# Patient Record
Sex: Female | Born: 1945 | Race: White | Hispanic: No | Marital: Married | Smoking: Never smoker
Health system: Southern US, Community
[De-identification: ages and names within clinical notes are randomized; demographics above are authoritative.]

## PROBLEM LIST (undated history)

## (undated) DIAGNOSIS — S62109A Fracture of unspecified carpal bone, unspecified wrist, initial encounter for closed fracture: Secondary | ICD-10-CM

## (undated) DIAGNOSIS — E749 Disorder of carbohydrate metabolism, unspecified: Secondary | ICD-10-CM

## (undated) DIAGNOSIS — R51 Headache: Secondary | ICD-10-CM

## (undated) DIAGNOSIS — R634 Abnormal weight loss: Secondary | ICD-10-CM

## (undated) DIAGNOSIS — R519 Headache, unspecified: Secondary | ICD-10-CM

## (undated) DIAGNOSIS — G8929 Other chronic pain: Secondary | ICD-10-CM

## (undated) DIAGNOSIS — R053 Chronic cough: Secondary | ICD-10-CM

## (undated) DIAGNOSIS — R05 Cough: Secondary | ICD-10-CM

## (undated) DIAGNOSIS — T7840XA Allergy, unspecified, initial encounter: Secondary | ICD-10-CM

## (undated) DIAGNOSIS — J449 Chronic obstructive pulmonary disease, unspecified: Secondary | ICD-10-CM

## (undated) DIAGNOSIS — R630 Anorexia: Secondary | ICD-10-CM

## (undated) DIAGNOSIS — E860 Dehydration: Secondary | ICD-10-CM

## (undated) DIAGNOSIS — R413 Other amnesia: Secondary | ICD-10-CM

## (undated) DIAGNOSIS — F419 Anxiety disorder, unspecified: Secondary | ICD-10-CM

## (undated) DIAGNOSIS — E7800 Pure hypercholesterolemia, unspecified: Secondary | ICD-10-CM

## (undated) HISTORY — DX: Allergy, unspecified, initial encounter: T78.40XA

## (undated) HISTORY — DX: Other disorders of bilirubin metabolism: E80.6

## (undated) HISTORY — DX: Pure hypercholesterolemia, unspecified: E78.00

## (undated) HISTORY — DX: Headache: R51

## (undated) HISTORY — DX: Other amnesia: R41.3

## (undated) HISTORY — DX: Disorder of carbohydrate metabolism, unspecified: E74.9

## (undated) HISTORY — DX: Chronic obstructive pulmonary disease, unspecified: J44.9

## (undated) HISTORY — DX: Anxiety disorder, unspecified: F41.9

## (undated) HISTORY — DX: Abnormal weight loss: R63.4

## (undated) HISTORY — DX: Fracture of unspecified carpal bone, unspecified wrist, initial encounter for closed fracture: S62.109A

## (undated) HISTORY — PX: TUBAL LIGATION: SHX77

## (undated) HISTORY — PX: BREAST LUMPECTOMY: SHX2

## (undated) HISTORY — DX: Other chronic pain: G89.29

## (undated) HISTORY — DX: Headache, unspecified: R51.9

---

## 2002-06-13 ENCOUNTER — Encounter: Payer: Self-pay | Admitting: Family Medicine

## 2002-06-13 LAB — CONVERTED CEMR LAB: Pap Smear: NORMAL

## 2002-06-15 ENCOUNTER — Encounter: Payer: Self-pay | Admitting: Family Medicine

## 2002-06-15 LAB — CONVERTED CEMR LAB
RBC count: 3.72 10*6/uL
TSH: 2.35 microintl units/mL
WBC, blood: 5.8 10*3/uL

## 2003-07-13 ENCOUNTER — Encounter: Payer: Self-pay | Admitting: Family Medicine

## 2003-07-13 LAB — CONVERTED CEMR LAB
Blood Glucose, Fasting: 100 mg/dL
TSH: 3.11 microintl units/mL

## 2003-07-20 ENCOUNTER — Encounter: Payer: Self-pay | Admitting: Family Medicine

## 2003-07-20 LAB — CONVERTED CEMR LAB: Pap Smear: NORMAL

## 2004-01-29 DIAGNOSIS — S62109A Fracture of unspecified carpal bone, unspecified wrist, initial encounter for closed fracture: Secondary | ICD-10-CM

## 2004-01-29 HISTORY — DX: Fracture of unspecified carpal bone, unspecified wrist, initial encounter for closed fracture: S62.109A

## 2004-07-18 ENCOUNTER — Encounter: Payer: Self-pay | Admitting: Family Medicine

## 2004-07-18 LAB — CONVERTED CEMR LAB: Blood Glucose, Fasting: 97 mg/dL

## 2004-07-21 ENCOUNTER — Other Ambulatory Visit: Admission: RE | Admit: 2004-07-21 | Discharge: 2004-07-21 | Payer: Self-pay | Admitting: Family Medicine

## 2004-07-21 ENCOUNTER — Encounter: Payer: Self-pay | Admitting: Family Medicine

## 2005-11-03 ENCOUNTER — Ambulatory Visit: Payer: Self-pay | Admitting: Family Medicine

## 2005-11-05 ENCOUNTER — Encounter: Payer: Self-pay | Admitting: Family Medicine

## 2005-11-05 ENCOUNTER — Other Ambulatory Visit: Admission: RE | Admit: 2005-11-05 | Discharge: 2005-11-05 | Payer: Self-pay | Admitting: Family Medicine

## 2005-11-05 ENCOUNTER — Ambulatory Visit: Payer: Self-pay | Admitting: Family Medicine

## 2005-11-10 ENCOUNTER — Ambulatory Visit: Payer: Self-pay | Admitting: Family Medicine

## 2005-11-15 ENCOUNTER — Encounter: Payer: Self-pay | Admitting: Family Medicine

## 2005-11-18 ENCOUNTER — Ambulatory Visit: Payer: Self-pay

## 2005-12-07 ENCOUNTER — Ambulatory Visit: Payer: Self-pay | Admitting: Family Medicine

## 2005-12-07 LAB — CONVERTED CEMR LAB
RBC count: 3.69 10*6/uL
TSH: 3.01 microintl units/mL
WBC, blood: 5.3 10*3/uL

## 2005-12-09 ENCOUNTER — Ambulatory Visit: Payer: Self-pay | Admitting: Family Medicine

## 2006-01-04 ENCOUNTER — Other Ambulatory Visit: Payer: Self-pay

## 2006-01-11 ENCOUNTER — Ambulatory Visit: Payer: Self-pay | Admitting: Ophthalmology

## 2006-01-20 ENCOUNTER — Ambulatory Visit: Payer: Self-pay | Admitting: Family Medicine

## 2006-03-09 ENCOUNTER — Ambulatory Visit: Payer: Self-pay | Admitting: Family Medicine

## 2006-03-11 ENCOUNTER — Ambulatory Visit: Payer: Self-pay | Admitting: Family Medicine

## 2006-10-08 ENCOUNTER — Ambulatory Visit: Payer: Self-pay

## 2006-11-04 ENCOUNTER — Ambulatory Visit: Payer: Self-pay | Admitting: Family Medicine

## 2006-11-08 ENCOUNTER — Encounter: Payer: Self-pay | Admitting: Family Medicine

## 2006-11-08 ENCOUNTER — Ambulatory Visit: Payer: Self-pay | Admitting: Family Medicine

## 2006-11-08 ENCOUNTER — Other Ambulatory Visit: Admission: RE | Admit: 2006-11-08 | Discharge: 2006-11-08 | Payer: Self-pay | Admitting: *Deleted

## 2006-12-03 LAB — HM DEXA SCAN

## 2006-12-15 ENCOUNTER — Ambulatory Visit: Payer: Self-pay | Admitting: Family Medicine

## 2006-12-22 ENCOUNTER — Ambulatory Visit: Payer: Self-pay | Admitting: Family Medicine

## 2006-12-27 ENCOUNTER — Ambulatory Visit: Payer: Self-pay | Admitting: Family Medicine

## 2007-01-04 ENCOUNTER — Ambulatory Visit: Payer: Self-pay | Admitting: Family Medicine

## 2007-03-30 ENCOUNTER — Telehealth: Payer: Self-pay | Admitting: Family Medicine

## 2007-04-13 ENCOUNTER — Telehealth (INDEPENDENT_AMBULATORY_CARE_PROVIDER_SITE_OTHER): Payer: Self-pay | Admitting: *Deleted

## 2007-05-06 ENCOUNTER — Ambulatory Visit: Payer: Self-pay | Admitting: Family Medicine

## 2007-05-10 ENCOUNTER — Ambulatory Visit: Payer: Self-pay | Admitting: Family Medicine

## 2007-05-10 DIAGNOSIS — R634 Abnormal weight loss: Secondary | ICD-10-CM

## 2007-05-13 ENCOUNTER — Ambulatory Visit: Payer: Self-pay | Admitting: Family Medicine

## 2007-05-13 LAB — CONVERTED CEMR LAB
ALT: 21 units/L (ref 0–40)
AST: 23 units/L (ref 0–37)
Albumin: 3.8 g/dL (ref 3.5–5.2)
Alkaline Phosphatase: 28 units/L — ABNORMAL LOW (ref 39–117)
Basophils Absolute: 0 10*3/uL (ref 0.0–0.1)
Calcium: 9.9 mg/dL (ref 8.4–10.5)
Chloride: 104 meq/L (ref 96–112)
Eosinophils Absolute: 0.3 10*3/uL (ref 0.0–0.6)
Eosinophils Relative: 4.7 % (ref 0.0–5.0)
GFR calc non Af Amer: 44 mL/min
MCV: 98.2 fL (ref 78.0–100.0)
Platelets: 186 10*3/uL (ref 150–400)
RBC: 3.99 M/uL (ref 3.87–5.11)
WBC: 5.5 10*3/uL (ref 4.5–10.5)

## 2007-09-20 ENCOUNTER — Telehealth (INDEPENDENT_AMBULATORY_CARE_PROVIDER_SITE_OTHER): Payer: Self-pay | Admitting: *Deleted

## 2007-10-05 ENCOUNTER — Telehealth: Payer: Self-pay | Admitting: Family Medicine

## 2007-10-17 ENCOUNTER — Telehealth: Payer: Self-pay | Admitting: Family Medicine

## 2008-01-16 ENCOUNTER — Ambulatory Visit: Payer: Self-pay | Admitting: Family Medicine

## 2008-01-16 LAB — CONVERTED CEMR LAB
Albumin: 4.2 g/dL (ref 3.5–5.2)
Alkaline Phosphatase: 24 units/L — ABNORMAL LOW (ref 39–117)
BUN: 13 mg/dL (ref 6–23)
Creatinine,U: 164.9 mg/dL
GFR calc Af Amer: 72 mL/min
Microalb Creat Ratio: 57 mg/g — ABNORMAL HIGH (ref 0.0–30.0)
Microalb, Ur: 9.4 mg/dL — ABNORMAL HIGH (ref 0.0–1.9)
Potassium: 4.4 meq/L (ref 3.5–5.1)
Total CHOL/HDL Ratio: 2.2
Total Protein: 7.3 g/dL (ref 6.0–8.3)
Triglycerides: 96 mg/dL (ref 0–149)
VLDL: 19 mg/dL (ref 0–40)

## 2008-01-18 ENCOUNTER — Encounter: Payer: Self-pay | Admitting: Family Medicine

## 2008-01-18 DIAGNOSIS — E7800 Pure hypercholesterolemia, unspecified: Secondary | ICD-10-CM

## 2008-01-19 DIAGNOSIS — E749 Disorder of carbohydrate metabolism, unspecified: Secondary | ICD-10-CM | POA: Insufficient documentation

## 2008-01-19 DIAGNOSIS — M810 Age-related osteoporosis without current pathological fracture: Secondary | ICD-10-CM | POA: Insufficient documentation

## 2008-01-20 ENCOUNTER — Other Ambulatory Visit: Admission: RE | Admit: 2008-01-20 | Discharge: 2008-01-20 | Payer: Self-pay | Admitting: Family Medicine

## 2008-01-20 ENCOUNTER — Ambulatory Visit: Payer: Self-pay | Admitting: Family Medicine

## 2008-01-20 ENCOUNTER — Encounter: Payer: Self-pay | Admitting: Family Medicine

## 2008-01-20 DIAGNOSIS — R05 Cough: Secondary | ICD-10-CM

## 2008-01-20 LAB — CONVERTED CEMR LAB: Pap Smear: NORMAL

## 2008-01-24 ENCOUNTER — Encounter: Payer: Self-pay | Admitting: Family Medicine

## 2008-01-25 ENCOUNTER — Encounter (INDEPENDENT_AMBULATORY_CARE_PROVIDER_SITE_OTHER): Payer: Self-pay | Admitting: *Deleted

## 2008-01-26 ENCOUNTER — Ambulatory Visit: Payer: Self-pay | Admitting: Family Medicine

## 2008-01-26 ENCOUNTER — Encounter: Payer: Self-pay | Admitting: Family Medicine

## 2008-01-26 LAB — HM MAMMOGRAPHY

## 2008-02-02 ENCOUNTER — Encounter (INDEPENDENT_AMBULATORY_CARE_PROVIDER_SITE_OTHER): Payer: Self-pay | Admitting: *Deleted

## 2008-02-28 ENCOUNTER — Ambulatory Visit: Payer: Self-pay | Admitting: Family Medicine

## 2008-02-28 LAB — CONVERTED CEMR LAB: OCCULT 1: NEGATIVE

## 2008-02-28 LAB — FECAL OCCULT BLOOD, GUAIAC: Fecal Occult Blood: NEGATIVE

## 2008-02-29 ENCOUNTER — Encounter (INDEPENDENT_AMBULATORY_CARE_PROVIDER_SITE_OTHER): Payer: Self-pay | Admitting: *Deleted

## 2009-01-21 ENCOUNTER — Ambulatory Visit: Payer: Self-pay | Admitting: Family Medicine

## 2009-01-21 LAB — CONVERTED CEMR LAB
ALT: 18 units/L (ref 0–35)
Alkaline Phosphatase: 23 units/L — ABNORMAL LOW (ref 39–117)
Basophils Absolute: 0 10*3/uL (ref 0.0–0.1)
Bilirubin, Direct: 0.2 mg/dL (ref 0.0–0.3)
CO2: 33 meq/L — ABNORMAL HIGH (ref 19–32)
Cholesterol: 184 mg/dL (ref 0–200)
Glucose, Bld: 100 mg/dL — ABNORMAL HIGH (ref 70–99)
HDL: 95.5 mg/dL (ref >39.0)
Hemoglobin: 13.1 g/dL (ref 12.0–15.0)
LDL Cholesterol: 75 mg/dL (ref 0–99)
Lymphocytes Relative: 30 % (ref 12.0–46.0)
MCHC: 35.2 g/dL (ref 30.0–36.0)
Microalb Creat Ratio: 65 mg/g — ABNORMAL HIGH (ref 0.0–30.0)
Microalb, Ur: 12 mg/dL — ABNORMAL HIGH (ref 0.0–1.9)
Monocytes Relative: 10.1 % (ref 3.0–12.0)
Neutrophils Relative %: 53.3 % (ref 43.0–77.0)
Platelets: 145 10*3/uL — ABNORMAL LOW (ref 150–400)
Potassium: 4 meq/L (ref 3.5–5.1)
RDW: 12.7 % (ref 11.5–14.6)
Sodium: 145 meq/L (ref 135–145)
Total Bilirubin: 1.3 mg/dL — ABNORMAL HIGH (ref 0.3–1.2)
Total CHOL/HDL Ratio: 1.9
Total Protein: 6.7 g/dL (ref 6.0–8.3)
Triglycerides: 70 mg/dL (ref 0–149)
VLDL: 14 mg/dL (ref 0–40)

## 2009-01-23 ENCOUNTER — Ambulatory Visit: Payer: Self-pay | Admitting: Family Medicine

## 2009-01-23 ENCOUNTER — Other Ambulatory Visit: Admission: RE | Admit: 2009-01-23 | Discharge: 2009-01-23 | Payer: Self-pay | Admitting: Family Medicine

## 2009-01-23 ENCOUNTER — Encounter: Payer: Self-pay | Admitting: Family Medicine

## 2009-01-23 LAB — CONVERTED CEMR LAB
Pap Smear: NORMAL
Vit D, 25-Hydroxy: 46 ng/mL (ref 30–89)

## 2009-01-28 ENCOUNTER — Encounter (INDEPENDENT_AMBULATORY_CARE_PROVIDER_SITE_OTHER): Payer: Self-pay | Admitting: *Deleted

## 2010-01-28 ENCOUNTER — Ambulatory Visit: Payer: Self-pay | Admitting: Family Medicine

## 2010-01-28 LAB — CONVERTED CEMR LAB
AST: 22 units/L (ref 0–37)
Albumin: 4.2 g/dL (ref 3.5–5.2)
Alkaline Phosphatase: 27 units/L — ABNORMAL LOW (ref 39–117)
Bilirubin, Direct: 0.2 mg/dL (ref 0.0–0.3)
CO2: 32 meq/L (ref 19–32)
Calcium: 9.7 mg/dL (ref 8.4–10.5)
Creatinine,U: 187.2 mg/dL
GFR calc non Af Amer: 58.94 mL/min (ref >60)
Glucose, Bld: 89 mg/dL (ref 70–99)
HDL: 107.9 mg/dL (ref >39.00)
Microalb, Ur: 11.1 mg/dL — ABNORMAL HIGH (ref 0.0–1.9)
Potassium: 4 meq/L (ref 3.5–5.1)
Sodium: 144 meq/L (ref 135–145)
Total CHOL/HDL Ratio: 2
VLDL: 11.6 mg/dL (ref 0.0–40.0)

## 2010-01-30 ENCOUNTER — Ambulatory Visit: Payer: Self-pay | Admitting: Family Medicine

## 2010-07-02 ENCOUNTER — Encounter (INDEPENDENT_AMBULATORY_CARE_PROVIDER_SITE_OTHER): Payer: Self-pay | Admitting: *Deleted

## 2010-12-24 ENCOUNTER — Ambulatory Visit: Admit: 2010-12-24 | Payer: Self-pay | Admitting: Family Medicine

## 2010-12-31 NOTE — Assessment & Plan Note (Signed)
Summary: cpx/rbh   Vital Signs:  Patient profile:   65 year old female Height:      66.5 inches Weight:      99.25 pounds BMI:     15.84 Temp:     76 degrees F oral Pulse rate:   76 / minute Pulse rhythm:   regular BP sitting:   130 / 70  (left arm) Cuff size:   regular  Vitals Entered By: Sydell Axon LPN (January 30, 453 2:15 PM) CC: 30 Minute checkup, hemoccult cards given to patient   History of Present Illness: Pt here for Comp Exam, late from being at the bank.  She is a little nervous from being late.  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 Take 1 Tablet By Mouth Once A Day 2)  Fosamax 70 Mg Tabs (Alendronate Sodium) .Marland Kitchen.. 1 Tablet By Mouth Once A Week 3)  Megace Oral 40 Mg/ml Susp (Megestrol Acetate) .... One Cc By Mouth Two Times A Day  Allergies: No Known Drug Allergies  Past History:  Past Surgical History: Last updated: 01/18/2008 NSVD X 1 BTL LUMPECTOMY 29 YOA DEXA -- OSTEOPENIA FEM. NECK:(06/21/2002) FX. WRIST --FELL ON TENNIS COURT MCL PROBLEM CAUSED FALL:(01/2004) DEXA -- OSTEOPENIA-- (07/28/2004) CAROTID ULTRA SOUND :(11/18/2005) DEXA LEFT FEMORAL NECK --3.33--1.11 IMPROVED ALSO L/S:(12/03/2006)  Family History: Last updated: 01/30/2010 Father dec  25  ALZHEIMERS; STROKES  Mother  dec  70 Natural Causes Alzheimers Fall LOST WEIGHT ; LIVES IN [place] SISTER A 61 SISTER A 55 CV: NEGATIVE HBP: ? AUNTS DM: NEGATIVE GOUT/ARTHRITIS: PROSTATE CANCER:  MGM THROAT //MAUNT THROAT BREAST/OVARIAN/UTERINE CANCER: NEGATIVE COLON CANCER: DEPRESSION: + SELF OFF AND ON ETOH/DRUG ABUSE: NEGATIVE OTHER : STROKE + FATHER MULTIPLE X 5   Social History: Last updated: 01/18/2008 Marital Status: Married  LIVES WITH HUSBAND Children: DAUGHTER 31 Occupation: HOUSEWIFE  Risk Factors: Alcohol Use: 0 (01/30/2010) Caffeine Use: 0 (01/30/2010) Exercise: yes (01/30/2010)  Risk Factors: Smoking Status: never (01/30/2010) Passive Smoke Exposure: no (01/30/2010)  Family History: Father dec  61  ALZHEIMERS; STROKES  Mother dec  29 Natural Causes Alzheimers Fall LOST WEIGHT ; LIVES IN [place] SISTER A 61 SISTER A 55 CV: NEGATIVE HBP: ? AUNTS DM: NEGATIVE GOUT/ARTHRITIS: PROSTATE CANCER:  MGM THROAT //MAUNT THROAT BREAST/OVARIAN/UTERINE CANCER: NEGATIVE COLON CANCER: DEPRESSION: + SELF OFF AND ON ETOH/DRUG ABUSE: NEGATIVE OTHER : STROKE + FATHER MULTIPLE X 5   Social History: Caffeine use/day:  0  Review of Systems General:  Complains of fatigue; denies chills, fever, sweats, weakness, and weight loss. Eyes:  Denies blurring, discharge, and eye pain; recent eye exam, catarract removals bilat.. ENT:  Denies decreased hearing, earache, and ringing in ears. CV:  Denies chest pain or discomfort, fainting, fatigue, palpitations, shortness of breath with exertion, swelling  of feet, and swelling of hands; fast beats at times. Resp:  Complains of cough; denies shortness of breath and wheezing. GI:  Denies abdominal pain, bloody stools, change in bowel habits, constipation, dark tarry stools, diarrhea, gas, indigestion, loss of appetite, nausea, vomiting, vomiting blood, and yellowish skin color. GU:  Complains of nocturia; denies discharge, dysuria, and urinary frequency; drinks a  lot in evening. MS:  Denies joint pain, joint redness, joint swelling, low back pain, muscle aches, cramps, and stiffness. Derm:  Denies dryness, itching, and rash. Neuro:  Denies numbness, poor balance, tingling, and tremors.  Physical Exam  General:  Well-developed,well-nourished,in no acute distress; alert,appropriate and cooperative throughout examination. Thin  Head:  Normocephalic and atraumatic without obvious abnormalities. No apparent alopecia or balding. Eyes:  Conjunctiva clear bilaterally.  Ears:  External ear exam shows no significant lesions or deformities.  Otoscopic examination reveals clear canals, tympanic membranes are intact bilaterally without bulging, retraction, inflammation or discharge. Hearing is grossly normal bilaterally. Mild cerumen bilat. Nose:  External nasal examination shows no deformity or inflammation. Nasal mucosa are pink and moist without lesions or exudates. Mouth:  Oral mucosa and oropharynx without lesions or exudates.  Teeth in good repair, dentures. Neck:  No deformities, masses, or tenderness noted. Chest Wall:  No deformities, masses, or tenderness noted. Breasts:  No mass, nodules, thickening, tenderness, bulging, retraction, inflamation, nipple discharge or skin changes noted.   Lungs:  Normal respiratory effort, chest expands symmetrically. Lungs are clear to auscultation, no crackles or wheezes. Minimal fine wheezes ant over manubrial area. Heart:  Normal rate and regular rhythm. S1 and S2 normal without gallop, murmur, click, rub or other extra sounds. Abdomen:  Bowel sounds positive,abdomen soft and non-tender without masses, organomegaly or hernias noted. Rectal:  No external abnormalities noted. Normal sphincter tone. No rectal masses or tenderness. G neg. Genitalia:  Pelvic Exam:        External: normal female genitalia without lesions or masses        Vagina: normal without lesions or masses        Cervix: normal without lesions or  masses        Adnexa: normal bimanual exam without masses or fullness        Uterus: normal by palpation        Pap smear: not performed Msk:  No deformity or scoliosis noted of thoracic or lumbar spine.   Pulses:  R and L carotid,radial,femoral,dorsalis pedis and posterior tibial pulses are full and equal bilaterally Extremities:  No clubbing, cyanosis, edema, or deformity noted with normal full range of motion of all joints.   Neurologic:  No cranial nerve deficits noted. Station and gait are normal. Sensory, motor and coordinative functions appear intact. Skin:  Intact without suspicious lesions or rashes, mild dryness in places. Eczema of left upper back. Cervical Nodes:  No lymphadenopathy noted Axillary Nodes:  No palpable lymphadenopathy Inguinal Nodes:  No significant adenopathy Psych:  Cognition and judgment appear intact. Alert and cooperative with normal attention span and concentration. No apparent delusions, illusions, hallucinations   Impression & Recommendations:  Problem # 1:  HEALTH MAINTENANCE EXAM (ICD-V70.0) Assessment Comment Only Suggest Zostavax and Pneumonia. Will give Pneumonia todayy. Go to [facility] pharmacy for Zostavax.  Problem # 2:  COUGH (ICD-786.2) Assessment: Unchanged Stable for many yrs, no known etiology.  Problem # 3:  HYPERCHOLESTEROLEMIA (ICD-272.0) Excellent on Prava, cont. Her updated medication list for this problem includes:    Pravachol 40 Mg Tabs (Pravastatin sodium) .Marland KitchenMarland KitchenMarland KitchenMarland Kitchen  Take 1 tablet by mouth once a day  Labs Reviewed: SGOT: 22 (01/28/2010)   SGPT: 16 (01/28/2010)   HDL:107.90 (01/28/2010), 95.5 (01/21/2009)  LDL:68 (01/28/2010), 75 (01/21/2009)  Chol:187 (01/28/2010), 184 (01/21/2009)  Trig:58.0 (01/28/2010), 70 (01/21/2009)  Problem # 4:  GILBERT'S SYNDROME (ICD-277.4) Assessment: Unchanged Stable.  Problem # 5:  WEIGHT LOSS (ICD-783.21) Assessment: Unchanged Stable now. Is going to take care of her 4 grandchildren and will  probabbly debilitate her, take Ensure.  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 Microalb worse...avoid sodas and drink lots of water.  Complete Medication List: 1)  Pravachol 40 Mg Tabs (Pravastatin sodium) .... Take 1 tablet by mouth once a day 2)  Fosamax 70 Mg Tabs (Alendronate sodium) .Marland Kitchen.. 1 tablet by mouth once a week  Other Orders: Pneumococcal Vaccine (95621) Admin 1st Vaccine (30865)  Patient Instructions: 1)  Suggest Ensure as supplement. 2)  RTC one year, sooner as needed.  Current Allergies (reviewed today): No known allergies    Pneumovax Vaccine    Vaccine Type: Pneumovax (Medicare)    Site: left deltoid    Mfr: Merck    Dose: 0.5 ml    Route: IM    Given by: Sydell Axon LPN    Exp. Date: 07/14/2011    Lot #: [id number]    VIS given: 06/27/96 version given January 30, 2010.

## 2010-12-31 NOTE — Letter (Signed)
Summary: Nadara Eaton letter  [place] [facility]  [address] [place], [place] [address]   Phone: [phone]  Fax: [phone]       07/02/2010 MRN: [id number]  Haley Kim [address] [place], [place]  [address]  Dear [name] [place], and [facility] announce the retirement of Arta Silence, M.D., from full-time practice at the [facility] office effective May 29, 2010 and his plans of returning part-time.  It is important to Dr. Hetty Ely and to our practice that you understand that [facility] has seven physicians in our office for your health care needs.  We will continue to offer the same exceptional care that you have today.    Dr. Hetty Ely has spoken to many of you about his plans for retirement and returning part-time in the fall.   We will continue to work with you through the transition to schedule appointments for you in the office and meet the high standards that [facility] is committed to.   Again, it is with great pleasure that we share the news that Dr. Hetty Ely will return to [facility] in October of 2011 with a reduced schedule.    If you have any questions, or would like to request an appointment with one of our physicians, please call us at [phone] and press the option for Scheduling an appointment.  We take pleasure in providing you with excellent patient care and look forward to seeing you at your next office visit.  Our [facility] Physicians are:  Tillman Abide, M.D. Laurita Quint, M.D. Roxy Manns, M.D. Kerby Nora, M.D. Hannah Beat, M.D. Ruthe Mannan, M.D. We proudly welcomed Raechel Ache, M.D. and Eustaquio Boyden, M.D. to the practice in July/August 2011.  Sincerely,  [facility]

## 2011-02-07 ENCOUNTER — Encounter: Payer: Self-pay | Admitting: Family Medicine

## 2011-04-03 ENCOUNTER — Other Ambulatory Visit: Payer: Self-pay | Admitting: Family Medicine

## 2011-04-03 DIAGNOSIS — E749 Disorder of carbohydrate metabolism, unspecified: Secondary | ICD-10-CM

## 2011-04-03 DIAGNOSIS — E7800 Pure hypercholesterolemia, unspecified: Secondary | ICD-10-CM

## 2011-04-06 ENCOUNTER — Other Ambulatory Visit: Payer: Self-pay | Admitting: Family Medicine

## 2011-04-06 DIAGNOSIS — E7800 Pure hypercholesterolemia, unspecified: Secondary | ICD-10-CM

## 2011-04-06 DIAGNOSIS — R634 Abnormal weight loss: Secondary | ICD-10-CM

## 2011-04-06 DIAGNOSIS — E749 Disorder of carbohydrate metabolism, unspecified: Secondary | ICD-10-CM

## 2011-04-07 ENCOUNTER — Other Ambulatory Visit (INDEPENDENT_AMBULATORY_CARE_PROVIDER_SITE_OTHER): Payer: Medicare Other | Admitting: Family Medicine

## 2011-04-07 DIAGNOSIS — E7800 Pure hypercholesterolemia, unspecified: Secondary | ICD-10-CM

## 2011-04-07 DIAGNOSIS — R634 Abnormal weight loss: Secondary | ICD-10-CM

## 2011-04-07 DIAGNOSIS — E749 Disorder of carbohydrate metabolism, unspecified: Secondary | ICD-10-CM

## 2011-04-07 LAB — LIPID PANEL
HDL: 85.7 mg/dL (ref >39.00)
LDL Cholesterol: 77 mg/dL (ref 0–99)
Total CHOL/HDL Ratio: 2
Triglycerides: 76 mg/dL (ref 0.0–149.0)
VLDL: 15.2 mg/dL (ref 0.0–40.0)

## 2011-04-07 LAB — CBC WITH DIFFERENTIAL/PLATELET
Basophils Absolute: 0 10*3/uL (ref 0.0–0.1)
Eosinophils Absolute: 0.3 10*3/uL (ref 0.0–0.7)
HCT: 37.6 % (ref 36.0–46.0)
Lymphocytes Relative: 24.5 % (ref 12.0–46.0)
Lymphs Abs: 1.5 10*3/uL (ref 0.7–4.0)
MCHC: 34.2 g/dL (ref 30.0–36.0)
Monocytes Relative: 7.4 % (ref 3.0–12.0)
Platelets: 172 10*3/uL (ref 150.0–400.0)
RDW: 14.7 % — ABNORMAL HIGH (ref 11.5–14.6)

## 2011-04-07 LAB — BASIC METABOLIC PANEL
Chloride: 107 mEq/L (ref 96–112)
Creatinine, Ser: 1 mg/dL (ref 0.4–1.2)

## 2011-04-07 LAB — MICROALBUMIN / CREATININE URINE RATIO: Creatinine,U: 231.1 mg/dL

## 2011-04-07 LAB — HEPATIC FUNCTION PANEL
ALT: 15 U/L (ref 0–35)
Bilirubin, Direct: 0.1 mg/dL (ref 0.0–0.3)
Total Bilirubin: 1.4 mg/dL — ABNORMAL HIGH (ref 0.3–1.2)

## 2011-04-09 ENCOUNTER — Other Ambulatory Visit (HOSPITAL_COMMUNITY)
Admission: RE | Admit: 2011-04-09 | Discharge: 2011-04-09 | Disposition: A | Discharge status: Home / Self Care | Payer: Medicare Other | Source: Ambulatory Visit | Attending: Family Medicine | Admitting: Family Medicine

## 2011-04-09 ENCOUNTER — Ambulatory Visit (INDEPENDENT_AMBULATORY_CARE_PROVIDER_SITE_OTHER): Payer: Medicare Other | Admitting: Family Medicine

## 2011-04-09 ENCOUNTER — Encounter: Payer: Self-pay | Admitting: Family Medicine

## 2011-04-09 VITALS — BP 128/70 | HR 80 | Temp 97.6°F | Ht 66.25 in | Wt 93.2 lb

## 2011-04-09 DIAGNOSIS — I6523 Occlusion and stenosis of bilateral carotid arteries: Secondary | ICD-10-CM

## 2011-04-09 DIAGNOSIS — E7800 Pure hypercholesterolemia, unspecified: Secondary | ICD-10-CM

## 2011-04-09 DIAGNOSIS — Z0000 Encounter for general adult medical examination without abnormal findings: Secondary | ICD-10-CM

## 2011-04-09 DIAGNOSIS — R634 Abnormal weight loss: Secondary | ICD-10-CM

## 2011-04-09 DIAGNOSIS — M810 Age-related osteoporosis without current pathological fracture: Secondary | ICD-10-CM

## 2011-04-09 DIAGNOSIS — I6529 Occlusion and stenosis of unspecified carotid artery: Secondary | ICD-10-CM

## 2011-04-09 DIAGNOSIS — Z01419 Encounter for gynecological examination (general) (routine) without abnormal findings: Secondary | ICD-10-CM | POA: Insufficient documentation

## 2011-04-09 DIAGNOSIS — E749 Disorder of carbohydrate metabolism, unspecified: Secondary | ICD-10-CM

## 2011-04-09 MED ORDER — PRAVASTATIN SODIUM 40 MG PO TABS: 40.0000 mg | ORAL_TABLET | Freq: Every evening | ORAL | Status: DC

## 2011-04-09 MED ORDER — ALENDRONATE SODIUM 70 MG PO TABS: 70.0000 mg | ORAL_TABLET | ORAL | Status: DC

## 2011-04-09 MED ORDER — PRAVASTATIN SODIUM 40 MG PO TABS: 40.0000 mg | ORAL_TABLET | Freq: Every day | ORAL | Status: DC

## 2011-04-09 MED ORDER — MIRTAZAPINE 15 MG PO TABS: 15.0000 mg | ORAL_TABLET | Freq: Every day | ORAL | Status: DC

## 2011-04-09 NOTE — Progress Notes (Signed)
Subjective:    Patient ID: Haley Kim, female    DOB: 1946-06-07, 65 y.o.   MRN: [id number]  HPI Pt here for Comp Exam. She has lost another 6 pounds since last year. She has not been hungry lately and is not eating much.  Her father was very tall and thin. She thinks staying with her daughter last year for 4 mos may have been the impetus in that there were four children there (12, 9, 5 and 11/2)  and she was very busy. She could have eaten but knows that she didn't. She denies anxiety. She has done Ensure or Boost in the past and got to the point she did not like the taste. History of cancer in the family limited to a grandmother with cancer of the mouth and an aunt of the back. She feels reasonably healthy. She occas gets lightheaed. She still walks regularly and still occas plays tennis, as she used to .  She is due to go back to the daughter's in one week for another 4 mos!! She had weighed 90 upon returning from the daughter's last year.    Review of Systems  Constitutional: Positive for fatigue (mild lately). Negative for fever, chills, diaphoresis, activity change and appetite change. Unexpected weight change: has lost another 6 pounds in the last year. Has been as low as 90 after the visit to he r daughter's.  HENT: Negative for hearing loss, ear pain, nosebleeds, rhinorrhea, trouble swallowing, tinnitus and ear discharge.   Eyes: Negative for pain, discharge, redness and visual disturbance.  Respiratory: Positive for cough. Negative for chest tightness, shortness of breath and wheezing.   Cardiovascular: Positive for palpitations. Negative for chest pain and leg swelling.  Gastrointestinal: Negative for nausea, vomiting, abdominal pain, diarrhea, constipation, blood in stool and abdominal distention.  Genitourinary: Negative for dysuria and frequency.  Musculoskeletal: Positive for arthralgias (shoulders in AM.). Negative for myalgias and back pain.  Skin: Negative for rash.    Neurological: Negative for dizziness, tremors, syncope and numbness.  Hematological: Negative for adenopathy. Does not bruise/bleed easily.  Psychiatric/Behavioral: Negative for hallucinations and agitation. The patient is not nervous/anxious.        Objective:   Physical Exam  Constitutional: She is oriented to person, place, and time. She appears well-developed and well-nourished. No distress.       Anorexically thin.  HENT:  Head: Normocephalic and atraumatic.  Right Ear: External ear normal.  Left Ear: External ear normal.  Nose: Nose normal.  Mouth/Throat: Oropharynx is clear and moist.  Eyes: Conjunctivae and EOM are normal. Pupils are equal, round, and reactive to light. No scleral icterus.  Neck: Normal range of motion. Neck supple. No thyromegaly present.  Cardiovascular: Normal rate, regular rhythm, normal heart sounds and intact distal pulses.   No murmur heard.      Neck with carotid bruits mild bilat.  Pulmonary/Chest: Effort normal and breath sounds normal. No respiratory distress. She has no wheezes.  Abdominal: Soft. Bowel sounds are normal. She exhibits no mass. There is no tenderness.  Genitourinary: Vagina normal and uterus normal. Guaiac negative stool.       Introitus Nml Cervix Nml without tenderness Ovaries Not Felt   Musculoskeletal: Normal range of motion.       Back Nontender in the Midline  Lymphadenopathy:    She has no cervical adenopathy.  Neurological: She is alert and oriented to person, place, and time. She exhibits normal muscle tone. Coordination normal.  Skin: Skin is warm  and dry. No rash noted. She is not diaphoretic.  Psychiatric: She has a normal mood and affect. Her behavior is normal. Judgment and thought content normal.          Assessment & Plan:  HMPE   I have personally reviewed the Medicare Annual Wellness questionnaire and have noted 1. The patient's medical and social history 2. Their use of alcohol, tobacco or illicit  drugs 3. Their current medications and supplements 4. The patient's functional ability including ADL's, fall risks, home safety risks and hearing or visual             impairment. 5. Diet and physical activities 6. Evidence for depression or mood disorders

## 2011-04-09 NOTE — Assessment & Plan Note (Signed)
Great nos.

## 2011-04-09 NOTE — Patient Instructions (Addendum)
Try Remeron 15 mg at night. Call upon returning to area to get Carotid U/S. Disuss iFOB vs Colonoscopy next time.

## 2011-04-09 NOTE — Assessment & Plan Note (Signed)
Euglycemic today.

## 2011-04-09 NOTE — Assessment & Plan Note (Addendum)
New Carotid Bruits bilat, will eval upon return.

## 2011-04-09 NOTE — Assessment & Plan Note (Signed)
Stable

## 2011-04-09 NOTE — Assessment & Plan Note (Signed)
Very thin and at risk.

## 2011-04-09 NOTE — Assessment & Plan Note (Signed)
3 pounds heavier than when she returned from her daughter's but 6 pounds lighter than when here last year. Have tried Megace in the past without improvement. Has been on Ensure and Boost w/o improvement. Will try low dose Remeron. 15mg  at supper to bedtime.

## 2011-04-22 ENCOUNTER — Encounter: Payer: Self-pay | Admitting: *Deleted

## 2011-04-23 ENCOUNTER — Encounter: Payer: Self-pay | Admitting: *Deleted

## 2011-09-07 ENCOUNTER — Other Ambulatory Visit: Payer: Self-pay | Admitting: Family Medicine

## 2011-09-07 DIAGNOSIS — M810 Age-related osteoporosis without current pathological fracture: Secondary | ICD-10-CM

## 2011-09-07 DIAGNOSIS — R634 Abnormal weight loss: Secondary | ICD-10-CM

## 2011-09-07 DIAGNOSIS — E7800 Pure hypercholesterolemia, unspecified: Secondary | ICD-10-CM

## 2011-09-07 DIAGNOSIS — E749 Disorder of carbohydrate metabolism, unspecified: Secondary | ICD-10-CM

## 2011-09-10 ENCOUNTER — Other Ambulatory Visit (INDEPENDENT_AMBULATORY_CARE_PROVIDER_SITE_OTHER): Payer: Medicare Other

## 2011-09-10 DIAGNOSIS — R634 Abnormal weight loss: Secondary | ICD-10-CM

## 2011-09-10 DIAGNOSIS — E7800 Pure hypercholesterolemia, unspecified: Secondary | ICD-10-CM

## 2011-09-10 DIAGNOSIS — E749 Disorder of carbohydrate metabolism, unspecified: Secondary | ICD-10-CM

## 2011-09-10 DIAGNOSIS — M810 Age-related osteoporosis without current pathological fracture: Secondary | ICD-10-CM

## 2011-09-10 LAB — RENAL FUNCTION PANEL
Albumin: 4.4 g/dL (ref 3.5–5.2)
BUN: 13 mg/dL (ref 6–23)
Chloride: 104 mEq/L (ref 96–112)
Creatinine, Ser: 0.8 mg/dL (ref 0.4–1.2)
GFR: 80.5 mL/min (ref >60.00)
Glucose, Bld: 91 mg/dL (ref 70–99)
Phosphorus: 2.8 mg/dL (ref 2.3–4.6)

## 2011-09-10 LAB — CBC WITH DIFFERENTIAL/PLATELET
Basophils Absolute: 0 10*3/uL (ref 0.0–0.1)
Eosinophils Relative: 3.2 % (ref 0.0–5.0)
Hemoglobin: 13.2 g/dL (ref 12.0–15.0)
Lymphocytes Relative: 20.5 % (ref 12.0–46.0)
Monocytes Relative: 7.5 % (ref 3.0–12.0)
Neutro Abs: 4.8 10*3/uL (ref 1.4–7.7)
Platelets: 168 10*3/uL (ref 150.0–400.0)
RDW: 14.7 % — ABNORMAL HIGH (ref 11.5–14.6)
WBC: 7.1 10*3/uL (ref 4.5–10.5)

## 2011-09-10 LAB — HEPATIC FUNCTION PANEL
ALT: 20 U/L (ref 0–35)
AST: 23 U/L (ref 0–37)
Albumin: 4.4 g/dL (ref 3.5–5.2)
Total Protein: 7.7 g/dL (ref 6.0–8.3)

## 2011-09-10 LAB — MICROALBUMIN / CREATININE URINE RATIO
Creatinine,U: 199.5 mg/dL
Microalb Creat Ratio: 12.8 mg/g (ref 0.0–30.0)

## 2011-09-10 LAB — LIPID PANEL
Cholesterol: 180 mg/dL (ref 0–200)
HDL: 92 mg/dL (ref >39.00)
Total CHOL/HDL Ratio: 2
Triglycerides: 104 mg/dL (ref 0.0–149.0)

## 2011-09-10 LAB — TSH: TSH: 1.96 u[IU]/mL (ref 0.35–5.50)

## 2011-09-15 ENCOUNTER — Ambulatory Visit (INDEPENDENT_AMBULATORY_CARE_PROVIDER_SITE_OTHER): Payer: Medicare Other | Admitting: Family Medicine

## 2011-09-15 ENCOUNTER — Encounter: Payer: Self-pay | Admitting: Family Medicine

## 2011-09-15 DIAGNOSIS — E749 Disorder of carbohydrate metabolism, unspecified: Secondary | ICD-10-CM

## 2011-09-15 DIAGNOSIS — E7800 Pure hypercholesterolemia, unspecified: Secondary | ICD-10-CM

## 2011-09-15 DIAGNOSIS — Z0000 Encounter for general adult medical examination without abnormal findings: Secondary | ICD-10-CM

## 2011-09-15 DIAGNOSIS — R0989 Other specified symptoms and signs involving the circulatory and respiratory systems: Secondary | ICD-10-CM

## 2011-09-15 DIAGNOSIS — R634 Abnormal weight loss: Secondary | ICD-10-CM

## 2011-09-15 DIAGNOSIS — I6523 Occlusion and stenosis of bilateral carotid arteries: Secondary | ICD-10-CM

## 2011-09-15 DIAGNOSIS — M810 Age-related osteoporosis without current pathological fracture: Secondary | ICD-10-CM

## 2011-09-15 DIAGNOSIS — Z1231 Encounter for screening mammogram for malignant neoplasm of breast: Secondary | ICD-10-CM

## 2011-09-15 DIAGNOSIS — I6529 Occlusion and stenosis of unspecified carotid artery: Secondary | ICD-10-CM

## 2011-09-15 MED ORDER — MIRTAZAPINE 15 MG PO TABS: 15.0000 mg | ORAL_TABLET | Freq: Every day | ORAL | Status: DC

## 2011-09-15 NOTE — Progress Notes (Signed)
Addended by: Arta Silence on: 09/15/2011 03:32 PM   Modules accepted: Orders

## 2011-09-15 NOTE — Assessment & Plan Note (Signed)
Will recheck DEXA. Vit D on high side of therapeutic. Gets Calcium.

## 2011-09-15 NOTE — Progress Notes (Signed)
Addended by: Arta Silence on: 09/15/2011 03:48 PM   Modules accepted: Orders

## 2011-09-15 NOTE — Patient Instructions (Addendum)
Refer for Mammo and DEXA and Carotid U/S. For chronic congestion, take Guaifenesin (400mg ), take 11/2 tabs by mouth AM and NOON. Get GUAIFENESIN by  [facility] or [facility] and getting MUCOUS RELIEF EXPECTORANT/CONGESTION. DO NOT GET MUCINEX (Timed Release Guaifenesin)  After things improve, take one a day.  Add Claritin, Allegra or Zyrtec daily at night for a few months to see if sxs improve.

## 2011-09-15 NOTE — Assessment & Plan Note (Signed)
Reasonably new bruit heard on the left. Will get U/S to assess. With her current lipid profile, think there should be minimal plaque.

## 2011-09-15 NOTE — Assessment & Plan Note (Signed)
Spent long time discussing her situation of poor eating at her daughter's as she is so busy when there. She has decided she cannot go back a s she cannot handle it any more. The youngest is now 67 years old, a strong little boy who is physical, active and defiant. I am proud of her for declining to continue to put herself through that. She should now be able to gain more weight. Has gained two pounds since May, most of that time spent at the daughter's house so the gain is actually pretty good.

## 2011-09-15 NOTE — Assessment & Plan Note (Signed)
Euglycemic today. Cont healthy diet.

## 2011-09-15 NOTE — Assessment & Plan Note (Signed)
Great nos on Prava 40. Cont. Lab Results  Component Value Date   CHOL 180 09/10/2011   HDL 92.00 09/10/2011   LDLCALC 67 09/10/2011   TRIG 104.0 09/10/2011   CHOLHDL 2 09/10/2011

## 2011-09-15 NOTE — Progress Notes (Signed)
Subjective:    Patient ID: Haley Kim, female    DOB: Nov 28, 1946, 65 y.o.   MRN: [id number]  HPI Pt here for followup. She had a PE in May but her insurance company called Haley Kim and then our scheduler to schedule this appt today.  She has been gone 41/2 months taking care of her grandchildren Takes lots of Advil and was diasabused of that.     Review of Systems  Constitutional: Positive for fatigue (better at home when not chasing grandchildren. Now playing  tennis again without too much difficulty.). Negative for fever, chills, diaphoresis and unexpected weight change.  HENT: Positive for tinnitus (occas on left.). Negative for hearing loss, ear pain, rhinorrhea and trouble swallowing.   Eyes: Negative for pain, discharge and visual disturbance.  Respiratory: Positive for cough (has chronic cough daily. See instructions.). Negative for shortness of breath and wheezing.   Cardiovascular: Negative for chest pain, palpitations and leg swelling.       No Fainting or Fatigue.  Gastrointestinal: Positive for diarrhea (occas according to intake.). Negative for nausea, vomiting, abdominal pain, constipation and blood in stool.       No Heartburn  Genitourinary: Negative for dysuria and frequency.       Nocturia 2-3 times a night.  Musculoskeletal: Negative for myalgias, back pain and arthralgias.  Skin: Negative for rash.       No Itching or Dryness.  Neurological: Negative for tremors and numbness.       No Tingling. No Balance Problems.  Hematological: Negative for adenopathy. Does not bruise/bleed easily.  Psychiatric/Behavioral: Negative for dysphoric mood and agitation.       Objective:   Physical Exam  Constitutional: She is oriented to person, place, and time. She appears well-developed and well-nourished. No distress.       Thin but stable.   HENT:  Head: Normocephalic and atraumatic.  Right Ear: External ear normal.  Left Ear: External ear normal.  Nose: Nose  normal.  Mouth/Throat: Oropharynx is clear and moist.  Eyes: Conjunctivae and EOM are normal. Pupils are equal, round, and reactive to light. No scleral icterus.  Neck: Normal range of motion. Neck supple. No thyromegaly present.  Cardiovascular: Normal rate, regular rhythm, normal heart sounds and intact distal pulses.   No murmur heard. Pulmonary/Chest: Effort normal and breath sounds normal. No respiratory distress. She has no wheezes.  Abdominal: Soft. Bowel sounds are normal. She exhibits no mass. There is no tenderness.  Genitourinary: Vagina normal and uterus normal.       Bimanual only done due to Pap in May. Introitus Nml Cervix Nml without tenderness, Uterus nml size w/o nodularity. Ovaries Not Felt   Musculoskeletal: Normal range of motion.       Back Nontender in the Midline  Lymphadenopathy:    She has no cervical adenopathy.  Neurological: She is alert and oriented to person, place, and time. She exhibits normal muscle tone. Coordination normal.  Skin: Skin is warm and dry. No rash noted. She is not diaphoretic.  Psychiatric: She has a normal mood and affect. Her behavior is normal. Judgment and thought content normal.          Assessment & Plan:  HMPE  I have personally reviewed the Medicare Annual Wellness questionnaire and have noted 1. The patient's medical and social history 2. Their use of alcohol, tobacco or illicit drugs 3. Their current medications and supplements 4. The patient's functional ability including ADL's, fall risks, home safety risks and hearing  or visual             impairment. 5. Diet and physical activities 6. Evidence for depression or mood disorders No signs of cognitive decline, no sign of depression.

## 2011-09-15 NOTE — Assessment & Plan Note (Signed)
Normalized Bili today.

## 2011-09-22 ENCOUNTER — Encounter: Payer: Medicare Other | Admitting: *Deleted

## 2011-09-24 ENCOUNTER — Other Ambulatory Visit: Payer: Self-pay | Admitting: Family Medicine

## 2011-09-24 ENCOUNTER — Other Ambulatory Visit: Payer: Medicare Other

## 2011-09-24 ENCOUNTER — Encounter: Payer: Self-pay | Admitting: *Deleted

## 2011-09-24 DIAGNOSIS — Z0000 Encounter for general adult medical examination without abnormal findings: Secondary | ICD-10-CM

## 2011-09-24 LAB — FECAL OCCULT BLOOD, IMMUNOCHEMICAL: Fecal Occult Bld: NEGATIVE

## 2011-09-29 ENCOUNTER — Encounter (INDEPENDENT_AMBULATORY_CARE_PROVIDER_SITE_OTHER): Payer: Medicare Other | Admitting: *Deleted

## 2011-09-29 DIAGNOSIS — I6529 Occlusion and stenosis of unspecified carotid artery: Secondary | ICD-10-CM

## 2011-09-29 DIAGNOSIS — R0989 Other specified symptoms and signs involving the circulatory and respiratory systems: Secondary | ICD-10-CM

## 2011-10-01 ENCOUNTER — Encounter: Payer: Self-pay | Admitting: Family Medicine

## 2011-10-06 ENCOUNTER — Other Ambulatory Visit: Payer: Self-pay | Admitting: Family Medicine

## 2011-10-06 NOTE — Telephone Encounter (Signed)
Received refill request electronically from pharmacy. Is it okay to refill mediation?

## 2011-11-03 ENCOUNTER — Telehealth: Payer: Self-pay | Admitting: Internal Medicine

## 2011-11-03 NOTE — Telephone Encounter (Signed)
Patient called and stated since Dr. Hetty Ely is retiring at the end of the year she would like to transfer to you as a new patient if you approve.  Please advise.

## 2011-11-03 NOTE — Telephone Encounter (Signed)
Patient notified

## 2011-11-03 NOTE — Telephone Encounter (Signed)
Yes this is ok 

## 2011-11-11 ENCOUNTER — Ambulatory Visit: Payer: Medicare Other | Admitting: Family Medicine

## 2011-11-12 ENCOUNTER — Encounter: Payer: Self-pay | Admitting: Family Medicine

## 2011-11-16 ENCOUNTER — Encounter: Payer: Self-pay | Admitting: Family Medicine

## 2011-11-19 ENCOUNTER — Ambulatory Visit (INDEPENDENT_AMBULATORY_CARE_PROVIDER_SITE_OTHER): Payer: Medicare Other | Admitting: Family Medicine

## 2011-11-19 ENCOUNTER — Ambulatory Visit: Payer: Medicare Other | Admitting: Internal Medicine

## 2011-11-19 ENCOUNTER — Encounter: Payer: Self-pay | Admitting: Family Medicine

## 2011-11-19 DIAGNOSIS — M810 Age-related osteoporosis without current pathological fracture: Secondary | ICD-10-CM

## 2011-11-19 DIAGNOSIS — M949 Disorder of cartilage, unspecified: Secondary | ICD-10-CM

## 2011-11-19 DIAGNOSIS — I6529 Occlusion and stenosis of unspecified carotid artery: Secondary | ICD-10-CM

## 2011-11-19 DIAGNOSIS — I6523 Occlusion and stenosis of bilateral carotid arteries: Secondary | ICD-10-CM

## 2011-11-19 DIAGNOSIS — R634 Abnormal weight loss: Secondary | ICD-10-CM

## 2011-11-19 DIAGNOSIS — M8580 Other specified disorders of bone density and structure, unspecified site: Secondary | ICD-10-CM

## 2011-11-19 NOTE — Assessment & Plan Note (Signed)
Wt Readings from Last 3 Encounters:  11/19/11 93 lb 12 oz (42.525 kg)  09/15/11 92 lb 12 oz (42.071 kg)  04/09/11 93 lb 4 oz (42.298 kg)   Stable.

## 2011-11-19 NOTE — Progress Notes (Signed)
  Subjective:    Patient ID: Haley Kim, female    DOB: 05-23-1946, 65 y.o.   MRN: [id number]  HPI Pt here with husband to discuss results of recent DEXA scan. She has risk factors of being white, thin, postmenopausal. She is very active and plays tennis regularly. She has been on Fosamax for quite a few years. She has been osteopenic on two previous DEXAs in the femoral head area. She has no complaints today and feels well. Husband also concerned about getting her carotids checked as she had 30% blockage in years past. Her chol is marvelous with LDL <70 and her carotids are quiet on exam. Discussed risk being very low.   Review of SystemsNoncontributory except as above.       Objective:   Physical Exam  Constitutional: She appears well-developed and well-nourished. No distress.  HENT:  Head: Normocephalic and atraumatic.  Right Ear: External ear normal.  Left Ear: External ear normal.  Nose: Nose normal.  Mouth/Throat: Oropharynx is clear and moist. No oropharyngeal exudate.  Eyes: Conjunctivae and EOM are normal. Pupils are equal, round, and reactive to light.  Neck: Normal range of motion. Neck supple. No thyromegaly present.       Carotids quiet bilaterally.  Cardiovascular: Normal rate, regular rhythm and normal heart sounds.   Pulmonary/Chest: Effort normal and breath sounds normal. She has no wheezes. She has no rales.  Lymphadenopathy:    She has no cervical adenopathy.  Skin: She is not diaphoretic.          Assessment & Plan:  Pt here at my request to discuss DEXA.

## 2011-11-19 NOTE — Assessment & Plan Note (Signed)
See osteoporosis above.

## 2011-11-19 NOTE — Patient Instructions (Signed)
RTC with Dr Para March in the future as needed.

## 2011-11-19 NOTE — Assessment & Plan Note (Signed)
Pt has been oseopenic for some time. Cont Vit D replacement and Calcium. Stop Fosamax. Recheck DEXA in future.

## 2011-11-19 NOTE — Assessment & Plan Note (Signed)
MInimal stenosis of 30% in past. LDL <70, carotids quiet. Risk low. Reassured.

## 2011-12-31 ENCOUNTER — Ambulatory Visit (INDEPENDENT_AMBULATORY_CARE_PROVIDER_SITE_OTHER): Payer: Self-pay | Admitting: Family Medicine

## 2011-12-31 ENCOUNTER — Encounter: Payer: Self-pay | Admitting: Family Medicine

## 2011-12-31 VITALS — BP 140/80 | HR 88 | Temp 97.8°F | Wt 93.0 lb

## 2011-12-31 DIAGNOSIS — R300 Dysuria: Secondary | ICD-10-CM

## 2011-12-31 LAB — POCT URINALYSIS DIPSTICK
Bilirubin, UA: NEGATIVE
Ketones, UA: NEGATIVE
Leukocytes, UA: NEGATIVE

## 2011-12-31 MED ORDER — SULFAMETHOXAZOLE-TRIMETHOPRIM 800-160 MG PO TABS: 1.0000 | ORAL_TABLET | Freq: Two times a day (BID) | ORAL | Status: DC

## 2011-12-31 NOTE — Patient Instructions (Signed)
Nice to meet you. Please take Bactrim as directed- 1 tablet twice daily for 5 days. Drink plenty of fluids. We will call you on Monday with your urine culture results.

## 2011-12-31 NOTE — Progress Notes (Signed)
SUBJECTIVE: Haley Kim is a 66 y.o. female new to me who complains of foul smelling urine, urgency and increased frequency x 21 days, without dysuria,  fever, chills, or abnormal vaginal discharge or bleeding.  She did have some left sided back pain a few days ago.  Patient Active Problem List  Diagnoses  . DISORDER, CARBOHYDRATE METABOLISM NOS  . HYPERCHOLESTEROLEMIA  . GILBERT'S SYNDROME  . WEIGHT LOSS  . Carotid stenosis, bilateral  . Osteopenia   Past Medical History  Diagnosis Date  . NSVD (normal spontaneous vaginal delivery)     x 1  . Wrist fracture 01/2004    fell on tennis court, muscle problem caused  fall   Past Surgical History  Procedure Date  . Breast lumpectomy     bilateral, age 80   History  Substance Use Topics  . Smoking status: Never Smoker   . Smokeless tobacco: Never Used  . Alcohol Use: No   Family History  Problem Relation Age of Onset  . Alzheimer's disease Mother   . Alzheimer's disease Father   . Stroke Father     x 5  . Cancer Paternal Aunt     throat  . Cancer Paternal Grandmother     throat  . Diabetes Neg Hx   . Heart disease Neg Hx   . Alcohol abuse Neg Hx    No Known Allergies Current Outpatient Prescriptions on File Prior to Visit  Medication Sig Dispense Refill  . Calcium Citrate-Vitamin D (CALCIUM CITRATE + PO) Take by mouth 3 (three) times daily.        . Cholecalciferol (VITAMIN D3) 3000 UNITS TABS Take by mouth daily.        . Coenzyme Q10 50 MG CAPS Take by mouth daily.        Marland Kitchen MAGNESIUM PO Take 2 by mouth 2 times a day       . MILK THISTLE PO Take by mouth 2 (two) times daily.        . Multiple Vitamin (MULTIVITAMIN) tablet Take 1 tablet by mouth daily.        . NON FORMULARY Coconut Oil; take one by mouth daily       . Omega-3 Fatty Acids (OMEGA 3 PO) Take by mouth daily.        . pravastatin (PRAVACHOL) 40 MG tablet TAKE ONE TABLET BY MOUTH EVERY DAY  90 tablet  3  . Probiotic Product (PROBIOTIC PO) Take by  mouth daily.        . psyllium (REGULOID) 0.52 G capsule Two by mouth three times a day       . vitamin E 400 UNIT capsule Take 400 Units by mouth daily.         The PMH, PSH, Social History, Family History, Medications, and allergies have been reviewed in [facility], and have been updated if relevant.  OBJECTIVE: BP 140/80  Pulse 88  Temp(Src) 97.8 F (36.6 C) (Oral)  Wt 93 lb (42.185 kg)  Appears well but thin, in no apparent distress.  Vital signs are normal. The abdomen is soft without tenderness, guarding, mass, rebound or organomegaly. No CVA tenderness or inguinal adenopathy noted. Urine dipstick shows positive for RBC's and positive for protein.    ASSESSMENT/Plan: possible UTI- will treat with bactrim and send urine for culture.  If no bacteria in cx, consider urology work up.

## 2012-01-03 LAB — URINE CULTURE

## 2012-01-04 ENCOUNTER — Other Ambulatory Visit: Payer: Self-pay | Admitting: Family Medicine

## 2012-01-04 MED ORDER — SULFAMETHOXAZOLE-TRIMETHOPRIM 800-160 MG PO TABS: 1.0000 | ORAL_TABLET | Freq: Two times a day (BID) | ORAL | Status: AC

## 2012-01-08 ENCOUNTER — Telehealth: Payer: Self-pay | Admitting: Family Medicine

## 2012-01-08 MED ORDER — MEGESTROL ACETATE 20 MG PO TABS: 20.0000 mg | ORAL_TABLET | Freq: Every day | ORAL | Status: AC

## 2012-01-08 NOTE — Telephone Encounter (Signed)
Rx sent 

## 2012-01-08 NOTE — Telephone Encounter (Signed)
Patient called to give Dr. Dayton Martes an update on her symptoms.  She stated that she doesn't really have an appetite but she does drink boost or ensure 2-3 times daily, she is still nauseated, not feeling as weak and she does stay up more during the day than she use to.  She still complains of congestion and phlegm but seems to be doing better.  Patient wanted to know if there is anything else she can take to help her appetite improve besides Mirtazapine?  Please advise.

## 2012-01-08 NOTE — Telephone Encounter (Signed)
Megace is an appetite suppressant.

## 2012-01-08 NOTE — Telephone Encounter (Signed)
Error, i meant to write that Megace an appetite stimulant, not suppressant!

## 2012-01-08 NOTE — Telephone Encounter (Signed)
It does have many risks and side effects so please make sure she reads those before starting this medication.

## 2012-01-08 NOTE — Telephone Encounter (Signed)
Patient advised as instructed via telephone, she stated that she would like the Rx for Megace sent to [facility].

## 2012-01-08 NOTE — Telephone Encounter (Signed)
Patient advised as instructed via telephone. 

## 2012-06-13 ENCOUNTER — Ambulatory Visit (INDEPENDENT_AMBULATORY_CARE_PROVIDER_SITE_OTHER)
Admission: RE | Admit: 2012-06-13 | Discharge: 2012-06-13 | Disposition: A | Discharge status: Home / Self Care | Payer: Medicare Other | Source: Ambulatory Visit | Attending: Family Medicine | Admitting: Family Medicine

## 2012-06-13 ENCOUNTER — Ambulatory Visit (INDEPENDENT_AMBULATORY_CARE_PROVIDER_SITE_OTHER): Payer: Medicare Other | Admitting: Family Medicine

## 2012-06-13 ENCOUNTER — Encounter: Payer: Self-pay | Admitting: Family Medicine

## 2012-06-13 VITALS — BP 122/70 | HR 80 | Temp 98.0°F | Wt 90.0 lb

## 2012-06-13 DIAGNOSIS — R300 Dysuria: Secondary | ICD-10-CM | POA: Insufficient documentation

## 2012-06-13 DIAGNOSIS — R05 Cough: Secondary | ICD-10-CM

## 2012-06-13 DIAGNOSIS — Z1211 Encounter for screening for malignant neoplasm of colon: Secondary | ICD-10-CM

## 2012-06-13 DIAGNOSIS — R634 Abnormal weight loss: Secondary | ICD-10-CM

## 2012-06-13 DIAGNOSIS — R059 Cough, unspecified: Secondary | ICD-10-CM

## 2012-06-13 LAB — COMPREHENSIVE METABOLIC PANEL
ALT: 17 U/L (ref 0–35)
AST: 25 U/L (ref 0–37)
Albumin: 4.4 g/dL (ref 3.5–5.2)
Alkaline Phosphatase: 34 U/L — ABNORMAL LOW (ref 39–117)
BUN: 21 mg/dL (ref 6–23)
CO2: 33 mEq/L — ABNORMAL HIGH (ref 19–32)
Calcium: 10.3 mg/dL (ref 8.4–10.5)
Chloride: 101 mEq/L (ref 96–112)
Creatinine, Ser: 1.2 mg/dL (ref 0.4–1.2)
GFR: 48.34 mL/min — ABNORMAL LOW (ref >60.00)
Glucose, Bld: 122 mg/dL — ABNORMAL HIGH (ref 70–99)
Potassium: 4.8 mEq/L (ref 3.5–5.1)
Sodium: 142 mEq/L (ref 135–145)
Total Bilirubin: 0.7 mg/dL (ref 0.3–1.2)
Total Protein: 7.5 g/dL (ref 6.0–8.3)

## 2012-06-13 LAB — CBC WITH DIFFERENTIAL/PLATELET
Basophils Absolute: 0 10*3/uL (ref 0.0–0.1)
Basophils Relative: 0.5 % (ref 0.0–3.0)
Eosinophils Absolute: 0.2 10*3/uL (ref 0.0–0.7)
Eosinophils Relative: 2.8 % (ref 0.0–5.0)
HCT: 40.7 % (ref 36.0–46.0)
Hemoglobin: 13.5 g/dL (ref 12.0–15.0)
Lymphocytes Relative: 19.8 % (ref 12.0–46.0)
Lymphs Abs: 1.2 10*3/uL (ref 0.7–4.0)
MCHC: 33.3 g/dL (ref 30.0–36.0)
MCV: 100.8 fl — ABNORMAL HIGH (ref 78.0–100.0)
Monocytes Absolute: 0.6 10*3/uL (ref 0.1–1.0)
Monocytes Relative: 9.4 % (ref 3.0–12.0)
Neutro Abs: 4 10*3/uL (ref 1.4–7.7)
Neutrophils Relative %: 67.5 % (ref 43.0–77.0)
Platelets: 182 10*3/uL (ref 150.0–400.0)
RBC: 4.04 Mil/uL (ref 3.87–5.11)
RDW: 13.7 % (ref 11.5–14.6)
WBC: 5.9 10*3/uL (ref 4.5–10.5)

## 2012-06-13 LAB — POCT URINALYSIS DIPSTICK
Bilirubin, UA: NEGATIVE
Glucose, UA: NEGATIVE
Ketones, UA: NEGATIVE
Leukocytes, UA: NEGATIVE
Nitrite, UA: NEGATIVE
Spec Grav, UA: >=1.03
Urobilinogen, UA: NEGATIVE
pH, UA: 6

## 2012-06-13 LAB — TSH: TSH: 2 u[IU]/mL (ref 0.35–5.50)

## 2012-06-13 MED ORDER — OMEPRAZOLE 20 MG PO CPDR: 20.0000 mg | DELAYED_RELEASE_CAPSULE | Freq: Every day | ORAL | Status: DC

## 2012-06-13 MED ORDER — CIPROFLOXACIN HCL 250 MG PO TABS: 250.0000 mg | ORAL_TABLET | Freq: Two times a day (BID) | ORAL | Status: AC

## 2012-06-13 MED ORDER — MIRTAZAPINE 15 MG PO TABS: 15.0000 mg | ORAL_TABLET | Freq: Every day | ORAL | Status: DC

## 2012-06-13 NOTE — Addendum Note (Signed)
Addended by: Eliezer Bottom on: 06/13/2012 04:40 PM   Modules accepted: Orders

## 2012-06-13 NOTE — Addendum Note (Signed)
Addended by: Alvina Chou on: 06/13/2012 05:13 PM   Modules accepted: Orders

## 2012-06-13 NOTE — Progress Notes (Signed)
Subjective:    Patient ID: Haley Kim, female    DOB: Dec 10, 1945, 66 y.o.   MRN: [id number]  HPI  66 yo female here with her husband with multiple complaints (previous Dr. Hetty Ely pt whom I have seen for one acute visit only).  Weight loss- notes reviewed, appears this has been a long standing issue.  According to Dr. Lorenza Chick notes and pt's husband, it was felt this was due to stressors of living with her children. She is no longer living with them but weight loss has persisted.  She feels nothing tastes good, does get full easily.  Has chronic cough.  No night sweats.  She has never had a colonoscopy- denies any blood in stool but often has constipation. Upon questioning, states at times she has difficulty swallowing solids but not usually.  Was previously on remeron and megace- felt neither one worked. Wt Readings from Last 3 Encounters:  06/13/12 90 lb (40.824 kg)  12/31/11 93 lb (42.185 kg)  11/19/11 93 lb 12 oz (42.525 kg)   Denies an eating disorder- husband states that all she eats is sugar and drinks diet pepsi. Denies any symptoms of anxiety or depression.  Dysuria-several days of dysuria.  No fever or back pain.  Cough- mainly when she lies down but can occur all the time. Denies feeling of acid in throat.  Patient Active Problem List  Diagnosis  . DISORDER, CARBOHYDRATE METABOLISM NOS  . HYPERCHOLESTEROLEMIA  . GILBERT'S SYNDROME  . WEIGHT LOSS  . Carotid stenosis, bilateral  . Osteopenia  . Dysuria  . Cough   Past Medical History  Diagnosis Date  . NSVD (normal spontaneous vaginal delivery)     x 1  . Wrist fracture 01/2004    fell on tennis court, muscle problem caused  fall   Past Surgical History  Procedure Date  . Breast lumpectomy     bilateral, age 22   History  Substance Use Topics  . Smoking status: Never Smoker   . Smokeless tobacco: Never Used  . Alcohol Use: No   Family History  Problem Relation Age of Onset  . Alzheimer's  disease Mother   . Alzheimer's disease Father   . Stroke Father     x 5  . Cancer Paternal Aunt     throat  . Cancer Paternal Grandmother     throat  . Diabetes Neg Hx   . Heart disease Neg Hx   . Alcohol abuse Neg Hx    No Known Allergies Current Outpatient Prescriptions on File Prior to Visit  Medication Sig Dispense Refill  . Ascorbic Acid (VITAMIN C) 1000 MG tablet Take 1,000 mg by mouth daily.      . Calcium Citrate-Vitamin D (CALCIUM CITRATE + PO) Take by mouth 3 (three) times daily.        . Cholecalciferol (VITAMIN D3) 3000 UNITS TABS Take by mouth daily.        . Coenzyme Q10 50 MG CAPS Take by mouth daily.        Marland Kitchen MAGNESIUM PO Take 2 by mouth 2 times a day       . Multiple Vitamin (MULTIVITAMIN) tablet Take 1 tablet by mouth daily.        . NON FORMULARY Coconut Oil; take one by mouth daily       . Omega-3 Fatty Acids (OMEGA 3 PO) Take by mouth daily.        . pravastatin (PRAVACHOL) 40 MG tablet TAKE ONE TABLET BY MOUTH  EVERY DAY  90 tablet  3  . Probiotic Product (PROBIOTIC PO) Take by mouth daily.        . psyllium (REGULOID) 0.52 G capsule Two by mouth three times a day       . vitamin E 400 UNIT capsule Take 400 Units by mouth daily.        . mirtazapine (REMERON) 15 MG tablet Take 1 tablet (15 mg total) by mouth at bedtime.  30 tablet  1  . omeprazole (PRILOSEC) 20 MG capsule Take 1 capsule (20 mg total) by mouth daily.  30 capsule  3   The PMH, PSH, Social History, Family History, Medications, and allergies have been reviewed in [facility], and have been updated if relevant.     Review of Systems See HPI   No CP No SOB Objective:   Physical Exam BP 122/70  Pulse 80  Temp 98 F (36.7 C)  Wt 90 lb (40.824 kg)  Constitutional: She is oriented to person, place, and time. She appears well-developed and well-nourished. No distress.  Anorexically thin.  HENT:  Head: Normocephalic and atraumatic.  Right Ear: External ear normal.  Left Ear: External ear normal.    Nose: Nose normal.  Mouth/Throat: Oropharynx is clear and moist.  Eyes: Conjunctivae and EOM are normal. Pupils are equal, round, and reactive to light. No scleral icterus.  Neck: Normal range of motion. Neck supple. No thyromegaly present.  Cardiovascular: Normal rate, regular rhythm, normal heart sounds and intact distal pulses.  No murmur heard. Neck with carotid bruits mild bilat.  Pulmonary/Chest: Effort normal and breath sounds normal. No respiratory distress. She has no wheezes.  Abdominal: Soft. Bowel sounds are normal. She exhibits no mass. There is no tenderness.  Musculoskeletal: Normal range of motion.  Back Nontender in the Midline  Lymphadenopathy:  She has no cervical adenopathy.  Neurological: She is alert and oriented to person, place, and time. She exhibits normal muscle tone. Coordination normal.  Skin: Skin is warm and dry. No rash noted. She is not diaphoretic.  Psychiatric: She has a normal mood and affect. Her behavior is normal. Judgment and thought content normal.      Assessment & Plan:   1. WEIGHT LOSS  Stable but persistent. Likely multifactorial, including poor diet, but other factors need to be ruled out.  Will start with complete blood work, GI referral for screening colonoscopy/possible endoscopy. Restart remeron. The patient indicates understanding of these issues and agrees with the plan.  CBC with Differential, Comprehensive metabolic panel, TSH, Vitamin D, 25-hydroxy, Ambulatory referral to Gastroenterology  2. Dysuria  New- UA pos for blood only, did not leave large enough sample for micro or cx. Treat for UTI with 3 day course of cipro, will send her home with specimen cup for repeat sample for micro and cx. POCT urinalysis dipstick  3. Cough  ?possible GERD. Given samples of prilosec 20 mg daily to take for 2 weeks. CXR given weight loss. DG Chest 2 View  4. Screening for colon cancer  Ambulatory referral to Gastroenterology

## 2012-06-13 NOTE — Patient Instructions (Addendum)
Good to see you. Please come back and leave a urine sample for [place] at your convenience. I will call you with your xray and lab results from today. Please stop by to see Shirlee Limerick on your way out to set up your referral with a stomach doctor. We are starting prilosec 20 mg daily. Please come see me in one month. Consider a nutrition referral.

## 2012-06-14 LAB — URINALYSIS, MICROSCOPIC ONLY: Squamous Epithelial / LPF: NONE SEEN

## 2012-06-14 LAB — VITAMIN D 25 HYDROXY (VIT D DEFICIENCY, FRACTURES): Vit D, 25-Hydroxy: 76 ng/mL (ref 30–89)

## 2012-06-15 ENCOUNTER — Other Ambulatory Visit: Payer: Self-pay | Admitting: Family Medicine

## 2012-06-15 DIAGNOSIS — R7989 Other specified abnormal findings of blood chemistry: Secondary | ICD-10-CM

## 2012-06-15 LAB — URINE CULTURE: Colony Count: 5000

## 2012-06-20 ENCOUNTER — Other Ambulatory Visit (INDEPENDENT_AMBULATORY_CARE_PROVIDER_SITE_OTHER): Payer: Medicare Other

## 2012-06-20 DIAGNOSIS — R7989 Other specified abnormal findings of blood chemistry: Secondary | ICD-10-CM

## 2012-06-20 LAB — COMPREHENSIVE METABOLIC PANEL
Albumin: 4.3 g/dL (ref 3.5–5.2)
BUN: 14 mg/dL (ref 6–23)
CO2: 31 mEq/L (ref 19–32)
GFR: 68.72 mL/min (ref >60.00)
Glucose, Bld: 108 mg/dL — ABNORMAL HIGH (ref 70–99)
Potassium: 4.1 mEq/L (ref 3.5–5.1)
Sodium: 143 mEq/L (ref 135–145)
Total Bilirubin: 1.2 mg/dL (ref 0.3–1.2)
Total Protein: 7.1 g/dL (ref 6.0–8.3)

## 2012-07-11 ENCOUNTER — Ambulatory Visit (INDEPENDENT_AMBULATORY_CARE_PROVIDER_SITE_OTHER): Payer: Medicare Other | Admitting: Gastroenterology

## 2012-07-11 ENCOUNTER — Encounter: Payer: Self-pay | Admitting: Gastroenterology

## 2012-07-11 ENCOUNTER — Other Ambulatory Visit: Payer: Medicare Other

## 2012-07-11 VITALS — BP 118/60 | HR 80 | Ht 66.0 in | Wt 93.0 lb

## 2012-07-11 DIAGNOSIS — R6881 Early satiety: Secondary | ICD-10-CM

## 2012-07-11 DIAGNOSIS — R634 Abnormal weight loss: Secondary | ICD-10-CM

## 2012-07-11 MED ORDER — MOVIPREP 100 G PO SOLR: 1.0000 | ORAL | Status: DC

## 2012-07-11 NOTE — Patient Instructions (Addendum)
You will have labs checked today in the basement lab.  Please head down after you check out with the front desk  (celiac sprue panel). You will be set up for a colonoscopy (for screening, weight loss, bowel troubles). You will be set up for an upper endoscopy for weight loss, early satiety.  LEC, moderate.  Please start taking citrucel (orange flavored) powder fiber supplement.  This may cause some bloating at first but that usually goes away. Begin with a small spoonful and work your way up to a large, heaping spoonful daily over a week. Try eating well rounded diet, higher in calorie.

## 2012-07-11 NOTE — Progress Notes (Signed)
HPI: This is a   very pleasant 66 year old woman who is here with her husband today.   CBC, complete metabolic profile, thyroid testing last month were all essentially normal except for slight macrocytosis  She has lost 20 pounds in about 2 years.  She has poor appetite.  Food not appetizing.  She has no post prandial pains.  BMs cause about pain for 30 prior and 30 min after. Dark brown to black stools.  Formed, occasionally diarrhea.  Can be constipated at times.  Takes advil 2-3 times a week only.    Non drinker.    Her husband brought up the fact that they both have some memory difficulties, his wife seems more prominent lately, perhaps worsening. She is not aware of memory difficulties. Both of her parents had Alzheimer's  Review of systems: Pertinent positive and negative review of systems were noted in the above HPI section. Complete review of systems was performed and was otherwise normal.    Past Medical History  Diagnosis Date  . NSVD (normal spontaneous vaginal delivery)     x 1  . Wrist fracture 01/2004    fell on tennis court, muscle problem caused  fall  . Pure hypercholesterolemia   . Disorders of bilirubin excretion   . Loss of weight   . 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 Breast lumpectomy     bilateral, age 45  . Tubal ligation     Current Outpatient Prescriptions  Medication Sig Dispense Refill  . Cholecalciferol (VITAMIN D3) 2000 UNITS TABS Take by mouth as directed.      . Coenzyme Q10 50 MG CAPS Take by mouth daily.        Marland Kitchen MAGNESIUM PO Take 500 mg by mouth as directed.       . mirtazapine (REMERON) 15 MG tablet Take 1 tablet (15 mg total) by mouth at bedtime.  30 tablet  1  . Multiple Vitamin (MULTIVITAMIN) tablet Take 1 tablet by mouth daily.        . Omega-3 Fatty Acids (OMEGA 3 PO) Take by mouth daily.        . pravastatin (PRAVACHOL) 40 MG tablet TAKE ONE TABLET BY MOUTH  EVERY DAY  90 tablet  3  . Probiotic Product (PROBIOTIC PO) Take by mouth daily.        . vitamin E 400 UNIT capsule Take 400 Units by mouth daily.        . psyllium (REGULOID) 0.52 G capsule Two by mouth three times a day         Allergies as of 07/11/2012  . (No Known Allergies)    Family History  Problem Relation Age of Onset  . Alzheimer's disease Mother   . Alzheimer's disease Father   . Stroke Father     x 5  . Cancer Paternal Aunt     throat  . Cancer Paternal Grandmother     throat  . Diabetes Neg Hx   . Heart disease Neg Hx   . Alcohol abuse Neg Hx   . Colon cancer Neg Hx     History   Social History  . Marital Status: Married    Spouse Name: N/A    Number of Children: 1  . Years of Education: N/A   Occupational History  . Housewife    Social History Main Topics  . Smoking status: Never Smoker   . Smokeless tobacco:  Never Used  . Alcohol Use: No  . Drug Use: No  . Sexually Active: No   Other Topics Concern  . Not on file   Social History Narrative  . No narrative on file       Physical Exam: BP 118/60  Pulse 80  Ht 5\' 6"  (1.676 m)  Wt 93 lb (42.185 kg)  BMI 15.01 kg/m2 Constitutional: Thin Psychiatric: alert and oriented x3 Eyes: extraocular movements intact Mouth: oral pharynx moist, no lesions Neck: supple no lymphadenopathy Cardiovascular: heart regular rate and rhythm Lungs: clear to auscultation bilaterally Abdomen: soft, nontender, nondistended, no obvious ascites, no peritoneal signs, normal bowel sounds Extremities: no lower extremity edema bilaterally Skin: no lesions on visible extremities    Assessment and plan: 66 y.o. female with  weight loss, anorexia, alternating bowel patterns, early satiety  I think we should definitely proceed with upper and lower endoscopies to rule out occult neoplasm. Try to explain her early satiety, alternating bowel patterns. I'm going to test her for celiac sprue with blood tests. This can  explain some weight loss possibly. I brought up the fact that her memory is worsening and both of her parents had Alzheimer's and perhaps she may also have that condition developing. I do understand that Alzheimer's can be associated with anorexia and weight loss. We agreed to complete his GI workup noted above and if unhelpful then she and her husband would be much more interested in hearing more about the possibility of Alzheimer's.

## 2012-07-12 LAB — CELIAC PANEL 10: IgA: 133 mg/dL (ref 69–380)

## 2012-07-25 ENCOUNTER — Ambulatory Visit (AMBULATORY_SURGERY_CENTER): Payer: Medicare Other | Admitting: Gastroenterology

## 2012-07-25 ENCOUNTER — Encounter: Payer: Self-pay | Admitting: Gastroenterology

## 2012-07-25 VITALS — BP 137/78 | HR 82 | Temp 99.1°F | Resp 16 | Ht 66.0 in | Wt 93.0 lb

## 2012-07-25 DIAGNOSIS — R6881 Early satiety: Secondary | ICD-10-CM

## 2012-07-25 DIAGNOSIS — K2990 Gastroduodenitis, unspecified, without bleeding: Secondary | ICD-10-CM

## 2012-07-25 DIAGNOSIS — K5730 Diverticulosis of large intestine without perforation or abscess without bleeding: Secondary | ICD-10-CM

## 2012-07-25 DIAGNOSIS — K2970 Gastritis, unspecified, without bleeding: Secondary | ICD-10-CM

## 2012-07-25 DIAGNOSIS — R634 Abnormal weight loss: Secondary | ICD-10-CM

## 2012-07-25 DIAGNOSIS — K449 Diaphragmatic hernia without obstruction or gangrene: Secondary | ICD-10-CM

## 2012-07-25 MED ORDER — SODIUM CHLORIDE 0.9 % IV SOLN: 500.0000 mL | INTRAVENOUS | Status: DC

## 2012-07-25 NOTE — Op Note (Signed)
[facility] [address] [place], [address]   ENDOSCOPY PROCEDURE REPORT  PATIENT: Haley, Kim  MR#: [id number] BIRTHDATE: 1946-08-11 , 66  yrs. old GENDER: Female ENDOSCOPIST: Rachael Fee, MD PROCEDURE DATE:  07/25/2012 PROCEDURE:  EGD w/ biopsy ASA CLASS:     Class III INDICATIONS:  weight loss, early satiety, equivocal celiac sprue markers. MEDICATIONS: There was residual sedation effect present from prior procedure, Versed 1 mg IV, and These medications were titrated to patient response per physician's verbal order TOPICAL ANESTHETIC: Cetacaine Spray  DESCRIPTION OF PROCEDURE: After the risks benefits and alternatives of the procedure were thoroughly explained, informed consent was obtained.  The LB [id number]-[id number] endoscope was introduced through the mouth and advanced to the second portion of the duodenum. Without limitations.  The instrument was slowly withdrawn as the mucosa was fully examined.    STOMACH: Mild gastritis (inflammation) was found in the gastric antrum.  A biopsy was performed using cold forceps.  Sample sent for histology.   A small hiatal hernia  was found.  The examination was otherwise normal.  Normal dudoenum biopsied and sent to pathology.  Retroflexed views revealed no abnormalities.     The scope was then withdrawn from the patient and the procedure completed.  COMPLICATIONS: There were no complications.  ENDOSCOPIC IMPRESSION: 1.   Gastritis (inflammation) was found in the gastric antrum; biopsied to check for H. pyori 2.   Hiatus hernia was found 3.   Normal duodenum biopsied to check for Celiac Sprue  RECOMMENDATIONS: Await biopsy results.  If neither H.  pylori or Celiac Sprue changes are noted, I will will likey refer her back to PCP to consider other etiologies (mother and father both had Altzheimers at young age and she herself has had some memory issues lately).    eSigned:  Rachael Fee, MD  07/25/2012 3:11 PM   AO:[id number], Ena Dawley MD

## 2012-07-25 NOTE — Patient Instructions (Addendum)

## 2012-07-25 NOTE — Progress Notes (Signed)
Patient did not experience any of the following events: a burn prior to discharge; a fall within the facility; wrong site/side/patient/procedure/implant event; or a hospital transfer or hospital admission upon discharge from the facility. (G8907) Patient did not have preoperative order for IV antibiotic SSI prophylaxis. (G8918)  

## 2012-07-25 NOTE — Op Note (Signed)
[facility] [address] [place], [address]   COLONOSCOPY PROCEDURE REPORT  PATIENT: Haley, Kim  MR#: [id number] BIRTHDATE: 08-Mar-1946 , 66  yrs. old GENDER: Female ENDOSCOPIST: Rachael Fee, MD REFERRED [id number] Aron, M.D. PROCEDURE DATE:  07/25/2012 PROCEDURE:   Colonoscopy, diagnostic ASA CLASS:   Class II INDICATIONS:weight loss. MEDICATIONS: Fentanyl 50 mcg IV, Versed 3 mg IV, and These medications were titrated to patient response per physician's verbal order  DESCRIPTION OF PROCEDURE:   After the risks benefits and alternatives of the procedure were thoroughly explained, informed consent was obtained.  A digital rectal exam revealed no abnormalities of the rectum.   The LB [id number]  endoscope was introduced through the anus and advanced to the cecum, which was identified by both the appendix and ileocecal valve. No adverse events experienced.   The quality of the prep was good.  The instrument was then slowly withdrawn as the colon was fully examined.    COLON FINDINGS: There was mild diverticulosis noted in the left colon with associated tortuosity.  The examination was otherwise normal.  Retroflexed views revealed no abnormalities. The time to cecum=9 minutes 26 seconds  Withdrawal time=4 minutes 37 seconds. The scope was withdrawn and the procedure completed. COMPLICATIONS: There were no complications.  ENDOSCOPIC IMPRESSION: There was mild diverticulosis noted in the left colon No polyps or cancers  RECOMMENDATIONS: 1.  You should continue to follow colorectal cancer screening guidelines for "routine risk" patients with a repeat colonoscopy in 10 years.  There is no need for FOBT (stool) testing for at least 5 years. 2.  Will proceed with EGD now.    eSigned:  Rachael Fee, MD 07/25/2012 2:58 PM

## 2012-07-26 ENCOUNTER — Telehealth: Payer: Self-pay | Admitting: *Deleted

## 2012-07-26 NOTE — Telephone Encounter (Signed)
  Follow up Call-  Call back number 07/25/2012  Post procedure Call Back phone  # [phone]  Permission to leave phone message Yes     Patient questions:  Do you have a fever, pain , or abdominal swelling? yes Pain Score  6 *  Have you tolerated food without any problems? yes  Have you been able to return to your normal activities? yes  Do you have any questions about your discharge instructions: Diet   no Medications  no Follow up visit  no  Do you have questions or concerns about your Care? no  Actions: * If pain score is 4 or above: Physician/ provider Notified : Rob Bunting, MD. Patient stating she awoke with a 6/10 pain in shoulders and back of her head. States she does have a history of headaches and questioned whether she should take Advil. Reviewed Endoscopy report with the patient. Diagnosed with Gastritis, informed the patient that Tylenol would be a better choice. Informed the patient to call PCP if pain or symptoms worsen. Patient denies any GI pains or problems. Note forwarded to Dr. Christella Hartigan for any further advice.

## 2012-07-26 NOTE — Telephone Encounter (Signed)
Pt headache and shoulder pain has gotten much better she will call with any further concerns

## 2012-07-26 NOTE — Telephone Encounter (Signed)
Great - thanks

## 2012-07-26 NOTE — Telephone Encounter (Signed)
I agree.  Patty, Can you call her later this afternoon to see how her headach, shoulder pain is doing

## 2012-07-29 ENCOUNTER — Encounter: Payer: Self-pay | Admitting: Gastroenterology

## 2012-08-16 ENCOUNTER — Encounter: Payer: Self-pay | Admitting: Family Medicine

## 2012-08-16 ENCOUNTER — Ambulatory Visit (INDEPENDENT_AMBULATORY_CARE_PROVIDER_SITE_OTHER): Payer: Medicare Other | Admitting: Family Medicine

## 2012-08-16 VITALS — BP 124/82 | HR 84 | Temp 97.7°F | Wt 91.0 lb

## 2012-08-16 DIAGNOSIS — R05 Cough: Secondary | ICD-10-CM

## 2012-08-16 DIAGNOSIS — R630 Anorexia: Secondary | ICD-10-CM

## 2012-08-16 DIAGNOSIS — R413 Other amnesia: Secondary | ICD-10-CM

## 2012-08-16 DIAGNOSIS — J449 Chronic obstructive pulmonary disease, unspecified: Secondary | ICD-10-CM

## 2012-08-16 DIAGNOSIS — E7800 Pure hypercholesterolemia, unspecified: Secondary | ICD-10-CM

## 2012-08-16 MED ORDER — FLUTICASONE-SALMETEROL 250-50 MCG/DOSE IN AEPB: 1.0000 | INHALATION_SPRAY | Freq: Two times a day (BID) | RESPIRATORY_TRACT | Status: DC

## 2012-08-16 MED ORDER — ALBUTEROL SULFATE HFA 108 (90 BASE) MCG/ACT IN AERS: 2.0000 | INHALATION_SPRAY | Freq: Four times a day (QID) | RESPIRATORY_TRACT | Status: DC | PRN

## 2012-08-16 NOTE — Patient Instructions (Addendum)
Good to see you. Please stop taking your pravachol.  We will recheck your cholesterol when you come in for your yearly visit in October/November.  We are starting Advair which you will take daily along with proair as needed for shortness of breath.  Please stop by to see Shirlee Limerick on your way out to set up your appointment with a lung doctor and a nutritionist.

## 2012-08-16 NOTE — Progress Notes (Signed)
Subjective:    Patient ID: Haley Kim, female    DOB: 16-Jun-1946, 66 y.o.   MRN: [id number]  HPI  66 yo female here with her husband with persistent weight loss.  I initially saw her in 05/2012 for this complaint which has been a long standing issue.     According to Dr. Lorenza Chick notes and pt's husband, originally it was felt this was due to stressors of living with her children. She is no longer living with them but weight loss has persisted.  She feels nothing tastes good, does get full easily.  Has chronic cough.  No night sweats.   Was previously on remeron and megace- felt neither one worked.  Wt Readings from Last 3 Encounters:  08/16/12 91 lb (41.277 kg)  07/25/12 93 lb (42.185 kg)  07/11/12 93 lb (42.185 kg)     I referred her to GI last month to rule out neoplasm, esophageal issue- EGD showed some gastritis, colonoscopy neg.  No malignancies noted.  Celiac and H. Pylori neg.  We also restarted Remeron in July which has not helped.  She did not refill it.  Lab Results  Component Value Date   WBC 5.9 06/13/2012   HGB 13.5 06/13/2012   HCT 40.7 06/13/2012   MCV 100.8* 06/13/2012   PLT 182.0 06/13/2012    Denies an eating disorder- husband states that all she eats is sugar and drinks diet pepsi. Denies any symptoms of anxiety or depression. States nothing tastes good.  Has had persistent memory loss.  Both of her parents suffered from Alzheimer's dementia. Both she and her husband feel that her memory loss is mild- she sometimes forgets to do something her husband asks her to do, otherwise not having memory loss.  Cough- mainly when she lies down but can occur all the time. Denies feeling of acid in throat. CXR was neg for acute findings in July- did show changes of COPD due to hyperinflation. She has never smoked. Cough can be very "deep" and productive.  ?GERD- gave her samples of prilosec last time- she has not continued this and does not think it  helped.  HLD- has been on pravastatin for years.  Lab Results  Component Value Date   CHOL 180 09/10/2011   HDL 92.00 09/10/2011   LDLCALC 67 09/10/2011   TRIG 104.0 09/10/2011   CHOLHDL 2 09/10/2011     Patient Active Problem List  Diagnosis  . DISORDER, CARBOHYDRATE METABOLISM NOS  . HYPERCHOLESTEROLEMIA  . GILBERT'S SYNDROME  . WEIGHT LOSS  . Carotid stenosis, bilateral  . Osteopenia  . Dysuria  . Cough  . Memory loss  . Loss of appetite   Past Medical History  Diagnosis Date  . NSVD (normal spontaneous vaginal delivery)     x 1  . Wrist fracture 01/2004    fell on tennis court, muscle problem caused  fall  . Pure hypercholesterolemia   . Disorders of bilirubin excretion   . Loss of weight   . 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 Allergy   . COPD (chronic obstructive pulmonary disease)    Past Surgical History  Procedure Date  . Breast lumpectomy     bilateral, age 37  . Tubal ligation    History  Substance Use Topics  . Smoking status: Never Smoker   . Smokeless tobacco: Never Used  . Alcohol Use: No   Family History  Problem Relation Age of Onset  . Alzheimer's disease Mother   .  Alzheimer's disease Father   . Stroke Father     x 5  . Heart disease Father   . Cancer Paternal Aunt     throat  . Cancer Paternal Grandmother     throat  . Diabetes Neg Hx   . Alcohol abuse Neg Hx   . Colon cancer Neg Hx    No Known Allergies Current Outpatient Prescriptions on File Prior to Visit  Medication Sig Dispense Refill  . Cholecalciferol (VITAMIN D3) 2000 UNITS TABS Take by mouth as directed.      . ciprofloxacin (CIPRO) 250 MG tablet       . Coenzyme Q10 50 MG CAPS Take by mouth daily.        Marland Kitchen MAGNESIUM PO Take 500 mg by mouth as directed.       . mirtazapine (REMERON) 15 MG tablet Take 1 tablet (15 mg total) by mouth at bedtime.  30 tablet  1  . Multiple Vitamin (MULTIVITAMIN) tablet Take 1 tablet by mouth daily.         . Omega-3 Fatty Acids (OMEGA 3 PO) Take by mouth daily.        . pravastatin (PRAVACHOL) 40 MG tablet TAKE ONE TABLET BY MOUTH EVERY DAY  90 tablet  3  . Probiotic Product (PROBIOTIC PO) Take by mouth daily.        . psyllium (REGULOID) 0.52 G capsule Two by mouth three times a day       . vitamin E 400 UNIT capsule Take 400 Units by mouth daily.         The PMH, PSH, Social History, Family History, Medications, and allergies have been reviewed in [facility], and have been updated if relevant.     Review of Systems See HPI   No CP No SOB No fevers No night sweats No bone pain   Objective:   Physical Exam BP 124/82  Pulse 84  Temp 97.7 F (36.5 C)  Wt 91 lb (41.277 kg) Wt Readings from Last 3 Encounters:  08/16/12 91 lb (41.277 kg)  07/25/12 93 lb (42.185 kg)  07/11/12 93 lb (42.185 kg)    Constitutional: She is oriented to person, place, and time. She appears well-developed and well-nourished. No distress.  Anorexically thin.  HENT:  Head: Normocephalic and atraumatic.  Right Ear: External ear normal.  Left Ear: External ear normal.  Nose: Nose normal.  Mouth/Throat: Oropharynx is clear and moist.  Eyes: Conjunctivae and EOM are normal. Pupils are equal, round, and reactive to light. No scleral icterus.  Neck: Normal range of motion. Neck supple. No thyromegaly present.  Cardiovascular: Normal rate, regular rhythm, normal heart sounds and intact distal pulses.  No murmur heard. Neck with carotid bruits mild bilat.  Pulmonary/Chest: Effort normal and breath sounds normal. No respiratory distress. She has no wheezes.  Abdominal: Soft. Bowel sounds are normal. She exhibits no mass. There is no tenderness.  Musculoskeletal: Normal range of motion.  Back Nontender in the Midline  Lymphadenopathy:  She has no cervical adenopathy.  Neurological: She is alert and oriented to person, place, and time. She exhibits normal muscle tone. Coordination normal. Oriented x 3, recall and  word finding normal  Skin: Skin is warm and dry. No rash noted. She is not diaphoretic.  Psychiatric: She has a normal mood and affect. Her behavior is normal. Judgment and thought content normal.      Assessment & Plan:   1. WEIGHT LOSS  Stable but persistent. Likely multifactorial, including  poor diet.  GI malignancy ruled out. CXR in July neg for masses as well. Refer to nutritionist.    2. Cough  COPD with ?GERD. Spirometry today- consistent with moderately severe obstruction. Will start on Advair along with proair for rescue inhaler. Refer to pulm.    3. Memory loss Very mild and not currently consistent with dementia.  Will continue to monitor.    4.  HLD-  D/c pravachol.  Lipids very low and she does not have CAD or DM. Recheck lipids next month when she comes in for physical.

## 2012-09-07 ENCOUNTER — Ambulatory Visit (INDEPENDENT_AMBULATORY_CARE_PROVIDER_SITE_OTHER): Payer: Medicare Other | Admitting: Internal Medicine

## 2012-09-07 ENCOUNTER — Encounter: Payer: Self-pay | Admitting: Internal Medicine

## 2012-09-07 VITALS — BP 130/80 | HR 82 | Temp 98.5°F | Ht 67.0 in | Wt 90.0 lb

## 2012-09-07 DIAGNOSIS — R05 Cough: Secondary | ICD-10-CM

## 2012-09-07 DIAGNOSIS — J329 Chronic sinusitis, unspecified: Secondary | ICD-10-CM

## 2012-09-07 NOTE — Progress Notes (Signed)
Subjective:    Patient ID: Haley Kim, female    DOB: [birth year], 66 y.o.   MRN: [id number]  HPI PMD is Ruthe Mannan, MD  Body mass index is 14.10 kg/(m^2).   reports that she has never smoked. She has never used smokeless tobacco.   IOV 09/07/2012  Referred for copd. BUt main complaint/symptoms is  cough when she saw PMD. Then testing with CXr and spirometry showed "COPD" and therefore referred here. In addition she has 12 other symptoms on printed sheet including dyspnea, atypical chest pain, pain in back, poor energy, waking up nauseated, frequent headaches, poor appetite, poor taste (everything tastes like mush), anosmia, staying in bed 12-15h/day (fatigue) and post prandial pain (reportedly has IBS). She has lost weight without intention. Current Body mass index is 14.10 kg/(m^2).   AT this visit, we focussed on cough  Cough. Insidious onset several years ago. Rates cough as severe. Progressive esp last 2 years. Quality is described as a shallow hacking cough.  Coughs constantly. Usually episodes are spontaneously. Worse when she lies down but also worsened by drinking something cold. Somewhat improved by warm air but no other relieving factors. There is associated clearing of throat a lot, and a feeling of sand in chest and the need to bring it out. However, mucus volume is very small and is clear and think. There is also associated wheezing and an occasional feeling of tickle in throat. Hx is alsp positive for mucus in throat but no gag and night sweats.   RSI cough score is 33 and c/w LPR cough. Kouffman differrentiator 2 forGERD and 4 for neurogenic/airwa cough. COPD CAT score 25 Exhaled nitric oxide 17 on 09/07/2012 (on and off advair x 2 wkeeks) and LOW PROB for asthma   BP  not on ace inhibitor  Sinus hx - no hx of seeing ENT doctor  - but has hx of chronic anterior nasal drainage +, poor sense of smell +, but no blockage hx  Allergy hx  - chocolate - headaches  - runny  nose during pollen season. Has hx of allergies and allergy shots in 1970s  - never seen allergist since 1970s  GERD hx  - hiatal hernia 2 months ago at time of Endoscopy by Dr Clearence Ped  Lung Disease  - recent dx of "copd" based on cxr and spirometry but she never smoked  - currently on advair trial 5 days - no help.  Hx of mdi trial several years ago no help - never smoked. Husband quit smoking 1970 but prior to that never smoked inside house. She has been married to him 72.  Dr Gretta Cool Reflux Symptom Index (> 13-15 suggestive of LPR cough) 0 -> 5  =  none ->severe problem  Hoarseness of problem with voice 5  Clearing  Of Throat 5  Excess throat mucus or feeling of post nasal drip 5  Difficulty swallowing food, liquid or tablets 0  Cough after eating or lying down 4  Breathing difficulties or choking episodes 4  Troublesome or annoying cough 5  Sensation of something sticking in throat or lump in throat 0  Heartburn, chest pain, indigestion, or stomach acid coming up 5  TOTAL 33     Kouffman Reflux v Neurogenic Cough Differentiator Reflux Comments  Do you awaken from a sound sleep coughing violently?                            With trouble  breathing?    Do you have choking episodes when you cannot  Get enough air, gasping for air ?                 Do you usually cough when you lie down into  The bed, or when you just lie down to rest ?                          Yes   Do you usually cough after meals or eating?         Yes   Do you cough when (or after) you bend over?       GERD SCORE  2   Kouffman Reflux v Neurogenic Cough Differentiator Neurogenic   Do you more-or-less cough all day long?    Does change of temperature make you cough? y   Does laughing or chuckling cause you to cough? y   Do fumes (perfume, automobile fumes, burned  Toast, etc.,) cause you to cough ?      y   Does speaking, singing, or talking on the phone cause you to cough   ?               y     Neurogenic/Airway score 4     CAT COPD Symptom & Quality of Life Score (GSK trademark) 0 is no burden. 5 is highest burden 09/07/2012   Never Cough -> Cough all the time 5  No phlegm in chest -> Chest is full of phlegm 4  No chest tightness -> Chest feels very tight 0  No dyspnea for 1 flight stairs/hill -> Very dyspneic for 1 flight of stairs 4  No limitations for ADL at home -> Very limited with ADL at home 3  Confident leaving home -> Not at all confident leaving home 2  Sleep soundly -> Do not sleep soundly because of lung condition 4  Lots of Energy -> No energy at all 3  TOTAL Score (max 40)  25      LABS CXR 06/13/12 - severe hyperinflation  Spiroometry 08/16/12   - fev1 1.2L/50%, FVC 1.8L/58%, Ratio 66  Walk test in office 09/07/2012  - pulse ox 98% at rest with HR 91. After 185 x 3 lpas pulse ox 93%, HR 113    Past Medical History  Diagnosis Date  . NSVD (normal spontaneous vaginal delivery)     x 1  . Wrist fracture 01/2004    fell on tennis court, muscle problem caused  fall  . Pure hypercholesterolemia   . Disorders of bilirubin excretion   . Loss of weight   . 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 Allergy   . COPD (chronic obstructive pulmonary disease)   . Chronic headache      Family History  Problem Relation Age of Onset  . Alzheimer's disease Mother   . Alzheimer's disease Father   . Stroke Father     x 5  . Heart disease Father   . Cancer Paternal Aunt     throat  . Cancer Paternal Grandmother     throat  . Diabetes Neg Hx   . Alcohol abuse Neg Hx   . Colon cancer Neg Hx      History   Social History  . Marital Status: Married    Spouse Name: N/A    Number of Children: 1  . Years of Education: N/A  Occupational History  . Housewife    Social History Main Topics  . Smoking status: Never Smoker   . Smokeless tobacco: Never Used  . Alcohol Use: No  . Drug Use: No  . Sexually Active: No   Other  Topics Concern  . Not on file   Social History Narrative  . No narrative on file     No Known Allergies   Outpatient Prescriptions Prior to Visit  Medication Sig Dispense Refill  . albuterol (PROVENTIL HFA;VENTOLIN HFA) 108 (90 BASE) MCG/ACT inhaler Inhale 2 puffs into the lungs every 6 (six) hours as needed for wheezing.  1 Inhaler  0  . Cholecalciferol (VITAMIN D3) 2000 UNITS TABS Take by mouth as directed.      . Coenzyme Q10 50 MG CAPS Take 120 mg by mouth daily.       . Fluticasone-Salmeterol (ADVAIR DISKUS) 250-50 MCG/DOSE AEPB Inhale 1 puff into the lungs 2 (two) times daily.  1 each  3  . MAGNESIUM PO Take 500 mg by mouth as directed.       . Multiple Vitamin (MULTIVITAMIN) tablet Take 1 tablet by mouth daily.        . Omega-3 Fatty Acids (OMEGA 3 PO) Take by mouth daily.        . Probiotic Product (PROBIOTIC PO) Take by mouth daily.        . vitamin E 400 UNIT capsule Take 400 Units by mouth daily.        . mirtazapine (REMERON) 15 MG tablet Take 15 mg by mouth at bedtime.      . psyllium (REGULOID) 0.52 G capsule Two by mouth three times a day           Review of Systems  Constitutional: Positive for appetite change and unexpected weight change. Negative for fever.  HENT: Positive for sore throat. Negative for ear pain, nosebleeds, congestion, rhinorrhea, sneezing, trouble swallowing, dental problem, postnasal drip and sinus pressure.   Eyes: Negative for redness and itching.  Respiratory: Positive for cough and shortness of breath. Negative for chest tightness and wheezing.   Cardiovascular: Positive for chest pain. Negative for palpitations and leg swelling.  Gastrointestinal: Positive for abdominal pain. Negative for nausea and vomiting.  Genitourinary: Negative for dysuria.  Musculoskeletal: Negative for joint swelling.  Skin: Negative for rash.  Neurological: Positive for headaches.  Hematological: Does not bruise/bleed easily.  Psychiatric/Behavioral: Positive  for dysphoric mood. The patient is not nervous/anxious.        Objective:   Physical Exam  Vitals reviewed. Constitutional: She is oriented to person, place, and time. She appears well-developed and well-nourished. No distress.       Body mass index is 14.10 kg/(m^2). 20# unintentional weight loss x 2 years  HENT:  Head: Normocephalic and atraumatic.  Right Ear: External ear normal.  Left Ear: External ear normal.  Mouth/Throat: Oropharynx is clear and moist. No oropharyngeal exudate.  Eyes: Conjunctivae normal and EOM are normal. Pupils are equal, round, and reactive to light. Right eye exhibits no discharge. Left eye exhibits no discharge. No scleral icterus.  Neck: Normal range of motion. Neck supple. No JVD present. No tracheal deviation present. No thyromegaly present.  Cardiovascular: Normal rate, regular rhythm, normal heart sounds and intact distal pulses.  Exam reveals no gallop and no friction rub.   No murmur heard. Pulmonary/Chest: Effort normal and breath sounds normal. No respiratory distress. She has no wheezes. She has no rales. She exhibits no tenderness.  Abdominal:  Soft. Bowel sounds are normal. She exhibits no distension and no mass. There is no tenderness. There is no rebound and no guarding.  Musculoskeletal: Normal range of motion. She exhibits no edema and no tenderness.  Lymphadenopathy:    She has no cervical adenopathy.  Neurological: She is alert and oriented to person, place, and time. She has normal reflexes. No cranial nerve deficit. She exhibits normal muscle tone. Coordination normal.  Skin: Skin is warm and dry. No rash noted. She is not diaphoretic. No erythema. No pallor.  Psychiatric: She has a normal mood and affect. Her behavior is normal. Judgment and thought content normal.          Assessment & Plan:

## 2012-09-07 NOTE — Patient Instructions (Addendum)
Please stop advair Please have PFT breathing test - after test, have them leave it on my desk. 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

## 2012-09-11 NOTE — Assessment & Plan Note (Signed)
Multitude of symptoms. CHronic cough with hx favoriing LPR cough. 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 Not sure of copd dx though cxr shows hyperinflation. Will start step by step. Counseled her that we need patience and several visits to sort all problems out.  PLAN Please stop advair Please have PFT breathing test - after test, have them leave it on my desk. 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

## 2012-09-13 ENCOUNTER — Encounter: Payer: Self-pay | Admitting: Internal Medicine

## 2012-09-19 ENCOUNTER — Ambulatory Visit: Payer: Self-pay | Admitting: Family Medicine

## 2012-09-29 ENCOUNTER — Ambulatory Visit (INDEPENDENT_AMBULATORY_CARE_PROVIDER_SITE_OTHER): Payer: Medicare Other | Admitting: Internal Medicine

## 2012-09-29 DIAGNOSIS — J449 Chronic obstructive pulmonary disease, unspecified: Secondary | ICD-10-CM

## 2012-09-29 LAB — PULMONARY FUNCTION TEST

## 2012-09-29 NOTE — Progress Notes (Signed)
PFT done today. 

## 2012-09-30 ENCOUNTER — Ambulatory Visit: Payer: Self-pay | Admitting: Family Medicine

## 2012-10-06 ENCOUNTER — Telehealth: Payer: Self-pay | Admitting: Internal Medicine

## 2012-10-06 DIAGNOSIS — R942 Abnormal results of pulmonary function studies: Secondary | ICD-10-CM

## 2012-10-06 DIAGNOSIS — R05 Cough: Secondary | ICD-10-CM

## 2012-10-06 NOTE — Telephone Encounter (Signed)
PFT 09/29/12 is mixed obstruction (asthma type) with restriction (TLC 66%) with mild reducd diffusion  Unclear if there is asthma or pulmonary fibrosis or emphysema due to secnd hand smoke.  Will get CT chest  Please let patient know  FU based on result

## 2012-10-10 ENCOUNTER — Other Ambulatory Visit: Payer: Medicare Other

## 2012-10-11 NOTE — Telephone Encounter (Signed)
Pt advised an CT appt is set. Carron Curie, CMA

## 2012-10-12 ENCOUNTER — Ambulatory Visit (INDEPENDENT_AMBULATORY_CARE_PROVIDER_SITE_OTHER)
Admission: RE | Admit: 2012-10-12 | Discharge: 2012-10-12 | Disposition: A | Discharge status: Home / Self Care | Payer: Medicare Other | Source: Ambulatory Visit | Attending: Internal Medicine | Admitting: Internal Medicine

## 2012-10-12 DIAGNOSIS — R05 Cough: Secondary | ICD-10-CM

## 2012-10-12 DIAGNOSIS — R942 Abnormal results of pulmonary function studies: Secondary | ICD-10-CM

## 2012-10-14 ENCOUNTER — Encounter: Payer: Self-pay | Admitting: Internal Medicine

## 2012-10-14 ENCOUNTER — Telehealth: Payer: Self-pay | Admitting: Internal Medicine

## 2012-10-14 NOTE — Telephone Encounter (Signed)
Notes Recorded by Kalman Shan, MD on 10/13/2012 at 10:39 AM Ct shows hyperinflation and one small 5mm Caviatary nodule in RML:/RLL image 33 that needs fu. Nothing in ct explains cough. Please have her finish ENT appt and see me for fu to discuss status so far and go over ct results -----  I spoke with patient about results and she verbalized understanding and had no questions. Per pt she saw ENT last month and was advised nothing further f/u was needed. She is scheduled to see MR 10/17/12 at 10:00 for appt.

## 2012-10-17 ENCOUNTER — Encounter: Payer: Self-pay | Admitting: Internal Medicine

## 2012-10-17 ENCOUNTER — Telehealth: Payer: Self-pay | Admitting: Internal Medicine

## 2012-10-17 ENCOUNTER — Ambulatory Visit (INDEPENDENT_AMBULATORY_CARE_PROVIDER_SITE_OTHER): Payer: Medicare Other | Admitting: Internal Medicine

## 2012-10-17 VITALS — BP 100/68 | HR 84 | Temp 97.4°F | Ht 67.0 in | Wt 89.8 lb

## 2012-10-17 DIAGNOSIS — R05 Cough: Secondary | ICD-10-CM

## 2012-10-17 DIAGNOSIS — R911 Solitary pulmonary nodule: Secondary | ICD-10-CM

## 2012-10-17 DIAGNOSIS — J387 Other diseases of larynx: Secondary | ICD-10-CM

## 2012-10-17 MED ORDER — GABAPENTIN 300 MG PO CAPS: ORAL_CAPSULE | ORAL | Status: DC

## 2012-10-17 MED ORDER — BUDESONIDE 90 MCG/ACT IN AEPB: 2.0000 | INHALATION_SPRAY | Freq: Two times a day (BID) | RESPIRATORY_TRACT | Status: DC

## 2012-10-17 MED ORDER — FLUTICASONE PROPIONATE 50 MCG/ACT NA SUSP: 2.0000 | Freq: Every day | NASAL | Status: DC

## 2012-10-17 NOTE — Progress Notes (Signed)
Subjective:    Patient ID: Haley Kim, female    DOB: 20-Jul-1946, 66 y.o.   MRN: [id number]  HPI PMD is Ruthe Mannan, MD  Body mass index is 14.10 kg/(m^2).   reports that she has never smoked. She has never used smokeless tobacco.   IOV 09/07/2012  Referred for copd. BUt main complaint/symptoms is  cough when she saw PMD. Then testing with CXr and spirometry showed "COPD" and therefore referred here. In addition she has 12 other symptoms on printed sheet including dyspnea, atypical chest pain, pain in back, poor energy, waking up nauseated, frequent headaches, poor appetite, poor taste (everything tastes like mush), anosmia, staying in bed 12-15h/day (fatigue) and post prandial pain (reportedly has IBS). She has lost weight without intention. Current Body mass index is 14.10 kg/(m^2).   AT this visit, we focussed on cough  Cough. Insidious onset several years ago. Rates cough as severe. Progressive esp last 2 years. Quality is described as a shallow hacking cough.  Coughs constantly. Usually episodes are spontaneously. Worse when she lies down but also worsened by drinking something cold. Somewhat improved by warm air but no other relieving factors. There is associated clearing of throat a lot, and a feeling of sand in chest and the need to bring it out. However, mucus volume is very small and is clear and think. There is also associated wheezing and an occasional feeling of tickle in throat. Hx is alsp positive for mucus in throat but no gag and night sweats.   RSI cough score is 33 and c/w LPR cough. Kouffman differrentiator 2 forGERD and 4 for neurogenic/airwa cough. COPD CAT score 25 Exhaled nitric oxide 17 on 09/07/2012 (on and off advair x 2 wkeeks) and LOW PROB for asthma   BP  not on ace inhibitor  Sinus hx - no hx of seeing ENT doctor  - but has hx of chronic anterior nasal drainage +, poor sense of smell +, but no blockage hx - not on Rx  Allergy hx  - chocolate -  headaches  - runny nose during pollen season. Has hx of allergies and allergy shots in 1970s  - never seen allergist since 1970s  GERD hx  - hiatal hernia 2 months ago at time of Endoscopy by Dr Clearence Ped - not on PPI - noted to be on omega-3  Lung Disease  - recent dx of "copd" based on cxr and spirometry but she never smoked  - currently on advair trial 5 days - no help.  Hx of mdi trial several years ago no help - never smoked. Husband quit smoking 1970 but prior to that never smoked inside house. She has been married to him 2.      LABS CXR 06/13/12 - severe hyperinflation  Spiroometry 08/16/12   - fev1 1.2L/50%, FVC 1.8L/58%, Ratio 66  Walk test in office 09/07/2012  - pulse ox 98% at rest with HR 91. After 185 x 3 lpas pulse ox 93%, HR 113  REC Please stop advair  Please have PFT breathing test - after test, have them leave it on my desk. 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 Will get CT chest  Telephone call 10/14/12 Ct shows hyperinflation and one small 5mm Caviatary nodule in RML:/RLL image 33 that needs fu. Nothing in ct explains cough. Please have her finish ENT appt and see me for fu to discuss status so far and go over ct results  OV 10/17/2012  Saw [facility] - sinus ct normal. GERD felt to be etiology. Discharged from followup  Cough - no better though RSI score down to lack of heartburn but husband says she has poor appetite and not eating so no heart burn. Poor appetite is a major issue   Dr Gretta Cool Reflux Symptom Index (> 13-15 suggestive of LPR cough) 09/07/12 10/17/2012   Hoarseness  of problem with voice 5 4  Clearing  Of Throat 5 4  Excess throat mucus or feeling of post nasal drip 5 5  Difficulty swallowing food, liquid or tablets 0 0  Cough after eating or lying down 4 5   Breathing difficulties or choking episodes 4 2  Troublesome or annoying cough 5 5  Sensation of something sticking in throat or lump in throat 0 0  Heartburn, chest pain, indigestion, or stomach acid coming up 5 0  TOTAL 33 25     Kouffman Reflux v Neurogenic Cough Differentiator Reflux 09/07/12 10/17/2012   Do you awaken from a sound sleep coughing violently?                            With trouble breathing?    Do you have choking episodes when you cannot  Get enough air, gasping for air ?                 Do you usually cough when you lie down into  The bed, or when you just lie down to rest ?                          Yes Yes, immediate  Do you usually cough after meals or eating?         Yes   Do you cough when (or after) you bend over?       GERD SCORE  2 2  Kouffman Reflux v Neurogenic Cough Differentiator Neurogenic   Do you more-or-less cough all day long?    Does change of temperature make you cough? y y  Does laughing or chuckling cause you to cough? y n  Do fumes (perfume, automobile fumes, burned  Toast, etc.,) cause you to cough ?      y y  Does speaking, singing, or talking on the phone cause you to cough   ?               y y  Neurogenic/Airway score 4 3    CAT COPD Symptom & Quality of Life Score (GSK trademark) 0 is no burden. 5 is highest burden 09/07/2012   Never Cough -> Cough all the time 5  No phlegm in chest -> Chest is full of phlegm 4  No chest tightness -> Chest feels very tight 0  No dyspnea for 1 flight stairs/hill -> Very dyspneic for 1 flight of stairs 4  No limitations for ADL at home -> Very limited with ADL at home 3  Confident leaving home -> Not at all confident leaving home 2  Sleep soundly -> Do not sleep soundly because of lung condition  4    Lots of Energy -> No energy at all 3  TOTAL Score (max 40)  25      Review of Systems  Constitutional: Negative for fever and unexpected weight change.  HENT: Negative for ear pain, nosebleeds, congestion, sore throat, rhinorrhea, sneezing, trouble swallowing, dental problem, postnasal drip and sinus pressure.   Eyes: Negative for redness and itching.  Respiratory: Negative for cough, chest tightness, shortness of breath and wheezing.   Cardiovascular: Negative for palpitations and leg swelling.  Gastrointestinal: Negative for nausea and vomiting.  Genitourinary: Negative for dysuria.  Musculoskeletal: Negative for joint swelling.  Skin: Negative for rash.  Neurological: Negative for headaches.  Hematological: Does not bruise/bleed easily.  Psychiatric/Behavioral: Negative for dysphoric mood. The patient is not nervous/anxious.    Current outpatient prescriptions:Calcium Carbonate (CALCIUM 600 PO), Take 1 tablet by mouth daily., Disp: , Rfl: ;  Cholecalciferol (VITAMIN D3) 2000 UNITS TABS, Take by mouth as directed., Disp: , Rfl: ;  Coenzyme Q10 50 MG CAPS, Take 120 mg by mouth daily. , Disp: , Rfl: ;  MAGNESIUM PO, Take 500 mg by mouth as directed. , Disp: , Rfl: ;  methylcellulose packet, Take by mouth as needed., Disp: , Rfl:  Multiple Vitamin (MULTIVITAMIN) tablet, Take 1 tablet by mouth daily.  , Disp: , Rfl: ;  Omega-3 Fatty Acids (OMEGA 3 PO), Take by mouth daily.  , Disp: , Rfl: ;  Probiotic Product (PROBIOTIC PO), Take by mouth daily.  , Disp: , Rfl: ;  vitamin E 400 UNIT capsule, Take 400 Units by mouth daily.  , Disp: , Rfl:      Objective:   Physical Exam Vitals reviewed. Constitutional: She is oriented to person, place, and time. She appears well-developed and well-nourished. No distress.       Body mass index is 14.06 kg/(m^2). Psychiatric: She has a normal mood and affect. Her behavior is normal. Judgment and thought content normal.    Discussion only visit      Assessment & Plan:

## 2012-10-17 NOTE — Patient Instructions (Addendum)
#  Lung nodule  -  You need folluwup ct chest in 7-8 months for 5mm Rt lung nodule   #Cough is from sinus drainage, acid reflux and +/- asthma All of this is working together to cause cyclical cough/LPR cough/Irritable Larynx To effectively get rid of cough you need to follow below instructions 100%   #Sinus drainage  - start/continue netti pot daily or atleast 3 times a week at night (OTC product) - - start nasal steroid generic fluticasone inhaler 2 squirts each nostril daily as advised (Rx sent to [facility] in [place])   #Possible Acid Reflux - STOP Fish poil  - take otc zegerid 20mg   1 capsule daily on empty stomach (if expensive call us)   -  avoid colas, spices, cheeses, spirits, red meats, beer, chocolates, fried foods etc.,   - sleep with head end of bed elevated  - eat small frequent meals  - do not go to bed for 3 hours after last meal  #Possible Asthma  - start pulmicort 2 puff twice daily (sent to [facility] in [place])  #Cyclical cough  - please choose 2-3 days and observe complete voice rest - no talking or whispering  - at all times there  there is urge to cough, drink water or swallow or sip on throat lozenge - referred to neuro rehab   -Take gabapentin 300mg  once daily x 3 days, then 300mg  twice daily x 3 days, then 300mg  three times daily to continue. If this makes you too sleepy or drowsy call us and we will cut your medication dosing down ([facility] will send to [facility])   #Followup - I will see you in 4-6 weeks. - Cough score at followup  - any problems call or come sooner

## 2012-10-17 NOTE — Telephone Encounter (Signed)
Spoke with pharmacist and advised meed should be taken gabapentin 300mg  once daily x 3 days, then 300mg  twice daily x 3 days, then 300mg  three times daily to continue She verbalized understanding and states nothing further needed

## 2012-10-19 ENCOUNTER — Encounter: Payer: Self-pay | Admitting: Internal Medicine

## 2012-10-19 ENCOUNTER — Ambulatory Visit (INDEPENDENT_AMBULATORY_CARE_PROVIDER_SITE_OTHER): Payer: Medicare Other | Admitting: Internal Medicine

## 2012-10-19 VITALS — BP 108/60 | HR 88 | Temp 97.5°F | Wt 91.0 lb

## 2012-10-19 DIAGNOSIS — R300 Dysuria: Secondary | ICD-10-CM

## 2012-10-19 LAB — POCT URINALYSIS DIPSTICK
Ketones, UA: NEGATIVE
Spec Grav, UA: >=1.03
Urobilinogen, UA: NEGATIVE
pH, UA: 7

## 2012-10-19 MED ORDER — CIPROFLOXACIN HCL 250 MG PO TABS: 250.0000 mg | ORAL_TABLET | Freq: Two times a day (BID) | ORAL | Status: DC

## 2012-10-19 NOTE — Assessment & Plan Note (Signed)
Urinalysis is benign Urine is very concentrated----this may be the reason for some of her symptoms Discussed increasing fluids Will treat with at least 3 days of antibiotic in case she has cystitis

## 2012-10-19 NOTE — Patient Instructions (Addendum)
Please greatly increase your fluid intake--your urine is very concentrated and this may be the reason for your symptoms In case you have a bladder infection, we will try an antibiotic (no evidence of a full blown urinary infection though) If the pain and urinary frequency quickly improve, you can stop the ciprofloxacin antibiotic after 3 days

## 2012-10-19 NOTE — Progress Notes (Signed)
Subjective:    Patient ID: Haley Kim, female    DOB: 04/18/1946, 66 y.o.   MRN: [id number]  HPI Has had burning dysuria Smells bad and is a dark yellow Nocturia every 1-2 hours which is normal for her Goes back a month or so May be some better but has persisted  No fever Has had some sweats No chills or shakes (but always cold) Does have bilateral hip and some back pain  No meds for this other than tylenol  Current Outpatient Prescriptions on File Prior to Visit  Medication Sig Dispense Refill  . Budesonide 90 MCG/ACT inhaler Inhale 2 puffs into the lungs 2 (two) times daily.  1 Inhaler  2  . Calcium Carbonate (CALCIUM 600 PO) Take 1 tablet by mouth daily.      . Cholecalciferol (VITAMIN D3) 2000 UNITS TABS Take by mouth as directed.      . Coenzyme Q10 50 MG CAPS Take 120 mg by mouth daily.       . fluticasone (FLONASE) 50 MCG/ACT nasal spray Place 2 sprays into the nose daily.  16 g  2  . gabapentin (NEURONTIN) 300 MG capsule Take 1 capsule for 3 days, 1 capsule BID, then 1 capsule TID  90 capsule  2  . MAGNESIUM PO Take 500 mg by mouth as directed.       . methylcellulose packet Take by mouth as needed.      . Multiple Vitamin (MULTIVITAMIN) tablet Take 1 tablet by mouth daily.        . Omega-3 Fatty Acids (OMEGA 3 PO) Take by mouth daily.        . Probiotic Product (PROBIOTIC PO) Take by mouth daily.        . vitamin E 400 UNIT capsule Take 400 Units by mouth daily.          No Known Allergies  Past Medical History  Diagnosis Date  . NSVD (normal spontaneous vaginal delivery)     x 1  . Wrist fracture 01/2004    fell on tennis court, muscle problem caused  fall  . Pure hypercholesterolemia   . Disorders of bilirubin excretion   . Loss of weight   . 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 Allergy   . COPD (chronic obstructive pulmonary disease)   . Chronic headache     Past Surgical History  Procedure Date  . Breast lumpectomy       bilateral, age 40  . Tubal ligation     Family History  Problem Relation Age of Onset  . Alzheimer's disease Mother   . Alzheimer's disease Father   . Stroke Father     x 5  . Heart disease Father   . Cancer Paternal Aunt     throat  . Cancer Paternal Grandmother     throat  . Diabetes Neg Hx   . Alcohol abuse Neg Hx   . Colon cancer Neg Hx     History   Social History  . Marital Status: Married    Spouse Name: N/A    Number of Children: 1  . Years of Education: N/A   Occupational History  . Housewife    Social History Main Topics  . Smoking status: Never Smoker   . Smokeless tobacco: Never Used  . Alcohol Use: No  . Drug Use: No  . Sexually Active: No   Other Topics Concern  . Not on file   Social History Narrative  .  No narrative on file   Review of Systems No vomiting but has intermittent nausea Tends to have regular loose stools--no change     Objective:   Physical Exam  Constitutional: She appears well-developed. No distress.  Abdominal: Soft. There is tenderness.       Mild epigastric > suprapubic tenderness  Musculoskeletal:       No CVA tenderness          Assessment & Plan:

## 2012-10-20 ENCOUNTER — Ambulatory Visit: Payer: Medicare Other | Admitting: Speech Pathology

## 2012-10-20 ENCOUNTER — Ambulatory Visit: Payer: Medicare Other | Attending: Internal Medicine

## 2012-10-20 DIAGNOSIS — IMO0001 Reserved for inherently not codable concepts without codable children: Secondary | ICD-10-CM | POA: Insufficient documentation

## 2012-10-20 DIAGNOSIS — R498 Other voice and resonance disorders: Secondary | ICD-10-CM | POA: Insufficient documentation

## 2012-10-20 DIAGNOSIS — R911 Solitary pulmonary nodule: Secondary | ICD-10-CM | POA: Insufficient documentation

## 2012-10-20 NOTE — Assessment & Plan Note (Signed)
#  Lung nodule  -  You need folluwup ct chest in 7-8 months for 5mm Rt lung nodule

## 2012-10-20 NOTE — Assessment & Plan Note (Signed)
 #  Cough is from sinus drainage, acid reflux and +/- asthma All of this is working together to cause cyclical cough/LPR cough/Irritable Larynx To effectively get rid of cough you need to follow below instructions 100%   #Sinus drainage  - start/continue netti pot daily or atleast 3 times a week at night (OTC product) - - start nasal steroid generic fluticasone inhaler 2 squirts each nostril daily as advised (Rx sent to [facility] in [place])   #Possible Acid Reflux - STOP Fish poil  - take otc zegerid 20mg   1 capsule daily on empty stomach (if expensive call us)   -  avoid colas, spices, cheeses, spirits, red meats, beer, chocolates, fried foods etc.,   - sleep with head end of bed elevated  - eat small frequent meals  - do not go to bed for 3 hours after last meal  #Possible Asthma  - start pulmicort 2 puff twice daily (sent to [facility] in [place])  #Cyclical cough  - please choose 2-3 days and observe complete voice rest - no talking or whispering  - at all times there  there is urge to cough, drink water or swallow or sip on throat lozenge - referred to neuro rehab   -Take gabapentin 300mg  once daily x 3 days, then 300mg  twice daily x 3 days, then 300mg  three times daily to continue. If this makes you too sleepy or drowsy call us and we will cut your medication dosing down ([facility] will send to [facility])   #Followup - I will see you in 4-6 weeks. - Cough score at followup  - any problems call or come sooner   > 50% of this 15 min visit spent in face to face counseling

## 2012-10-25 ENCOUNTER — Ambulatory Visit: Payer: Medicare Other

## 2012-10-30 ENCOUNTER — Ambulatory Visit: Payer: Self-pay | Admitting: Family Medicine

## 2012-11-09 ENCOUNTER — Other Ambulatory Visit: Payer: Self-pay | Admitting: Family Medicine

## 2012-11-09 DIAGNOSIS — E7800 Pure hypercholesterolemia, unspecified: Secondary | ICD-10-CM

## 2012-11-14 ENCOUNTER — Other Ambulatory Visit (INDEPENDENT_AMBULATORY_CARE_PROVIDER_SITE_OTHER): Payer: Medicare Other

## 2012-11-14 DIAGNOSIS — E7800 Pure hypercholesterolemia, unspecified: Secondary | ICD-10-CM

## 2012-11-14 LAB — COMPREHENSIVE METABOLIC PANEL
ALT: 20 U/L (ref 0–35)
BUN: 15 mg/dL (ref 6–23)
CO2: 33 mEq/L — ABNORMAL HIGH (ref 19–32)
Calcium: 9.5 mg/dL (ref 8.4–10.5)
Chloride: 100 mEq/L (ref 96–112)
Creatinine, Ser: 0.9 mg/dL (ref 0.4–1.2)
GFR: 66 mL/min (ref >60.00)

## 2012-11-14 LAB — LIPID PANEL
HDL: 95.5 mg/dL (ref >39.00)
Triglycerides: 99 mg/dL (ref 0.0–149.0)

## 2012-11-17 ENCOUNTER — Encounter: Payer: Self-pay | Admitting: Family Medicine

## 2012-11-17 ENCOUNTER — Ambulatory Visit (INDEPENDENT_AMBULATORY_CARE_PROVIDER_SITE_OTHER): Payer: Medicare Other | Admitting: Family Medicine

## 2012-11-17 VITALS — BP 160/82 | HR 72 | Temp 97.6°F | Ht 66.5 in | Wt 90.0 lb

## 2012-11-17 DIAGNOSIS — R94120 Abnormal auditory function study: Secondary | ICD-10-CM | POA: Diagnosis not present

## 2012-11-17 DIAGNOSIS — F509 Eating disorder, unspecified: Secondary | ICD-10-CM

## 2012-11-17 DIAGNOSIS — M899 Disorder of bone, unspecified: Secondary | ICD-10-CM | POA: Diagnosis not present

## 2012-11-17 DIAGNOSIS — Z23 Encounter for immunization: Secondary | ICD-10-CM | POA: Diagnosis not present

## 2012-11-17 DIAGNOSIS — E7800 Pure hypercholesterolemia, unspecified: Secondary | ICD-10-CM | POA: Diagnosis not present

## 2012-11-17 DIAGNOSIS — H6120 Impacted cerumen, unspecified ear: Secondary | ICD-10-CM

## 2012-11-17 DIAGNOSIS — M8580 Other specified disorders of bone density and structure, unspecified site: Secondary | ICD-10-CM

## 2012-11-17 DIAGNOSIS — Z1231 Encounter for screening mammogram for malignant neoplasm of breast: Secondary | ICD-10-CM

## 2012-11-17 DIAGNOSIS — Z0000 Encounter for general adult medical examination without abnormal findings: Secondary | ICD-10-CM | POA: Diagnosis not present

## 2012-11-17 DIAGNOSIS — M949 Disorder of cartilage, unspecified: Secondary | ICD-10-CM | POA: Diagnosis not present

## 2012-11-17 NOTE — Progress Notes (Signed)
Subjective:    Patient ID: Haley Kim, female    DOB: Dec 30, 1945, 66 y.o.   MRN: [id number]  HPI  66 yo female here with her husband for medicare annual wellness visit.  I have personally reviewed the Medicare Annual Wellness questionnaire and have noted 1. The patient's medical and social history 2. Their use of alcohol, tobacco or illicit drugs 3. Their current medications and supplements 4. The patient's functional ability including ADL's, fall risks, home safety risks and hearing or visual             impairment. 5. Diet and physical activities 6. Evidence for depression or mood disorders   Weight loss- weight stable, persistent issue.  GI work up neg. Referred to nutritionist who discharged pt bc she has an "unusual relationship with food," according to her husband.   She feels she is "does not have an eating disorder."  Often just not hungry. Was previously on remeron and megace- felt neither one worked. Wt Readings from Last 3 Encounters:  11/17/12 90 lb (40.824 kg)  10/19/12 91 lb (41.277 kg)  10/17/12 89 lb 12.8 oz (40.733 kg)   Denies an eating disorder- husband states that all she eats is sugar and drinks diet pepsi. Denies any symptoms of anxiety or depression.   Osteopenia- stopped fosamax over a year ago.  Due for repeat DEXA.  She is taking Calcium and Vit D.  Plays tennis.  No recent falls.  She has been seeing Dr. Marchelle Gearing for cough and irritable larynx syndrome.  Symptoms do seem to be better with Gabapentin. Patient Active Problem List  Diagnosis  . DISORDER, CARBOHYDRATE METABOLISM NOS  . HYPERCHOLESTEROLEMIA  . GILBERT'S SYNDROME  . WEIGHT LOSS  . Carotid stenosis, bilateral  . Osteopenia  . Dysuria  . Cough  . Memory loss  . Loss of appetite  . COPD (chronic obstructive pulmonary disease)  . Pulmonary nodule, right middle lobe, 5mm, cavitary - Nov 2013 (done for chronic cough)  . Routine general medical examination at a health care  facility  . Failed hearing screening   Past Medical History  Diagnosis Date  . NSVD (normal spontaneous vaginal delivery)     x 1  . Wrist fracture 01/2004    fell on tennis court, muscle problem caused  fall  . Pure hypercholesterolemia   . Disorders of bilirubin excretion   . Loss of weight   . 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 Allergy   . COPD (chronic obstructive pulmonary disease)   . Chronic headache    Past Surgical History  Procedure Date  . Breast lumpectomy     bilateral, age 44  . Tubal ligation    History  Substance Use Topics  . Smoking status: Never Smoker   . Smokeless tobacco: Never Used  . Alcohol Use: No   Family History  Problem Relation Age of Onset  . Alzheimer's disease Mother   . Alzheimer's disease Father   . Stroke Father     x 5  . Heart disease Father   . Cancer Paternal Aunt     throat  . Cancer Paternal Grandmother     throat  . Diabetes Neg Hx   . Alcohol abuse Neg Hx   . Colon cancer Neg Hx    No Known Allergies Current Outpatient Prescriptions on File Prior to Visit  Medication Sig Dispense Refill  . Budesonide 90 MCG/ACT inhaler Inhale 2 puffs into the lungs 2 (two) times  daily.  1 Inhaler  2  . Calcium Carbonate (CALCIUM 600 PO) Take 1 tablet by mouth daily.      . Cholecalciferol (VITAMIN D3) 2000 UNITS TABS Take by mouth as directed.      . Coenzyme Q10 50 MG CAPS Take 120 mg by mouth daily.       . fluticasone (FLONASE) 50 MCG/ACT nasal spray Place 2 sprays into the nose daily.  16 g  2  . gabapentin (NEURONTIN) 300 MG capsule Take 300 mg by mouth 3 (three) times daily.      Marland Kitchen MAGNESIUM PO Take 500 mg by mouth as directed.       . methylcellulose packet Take by mouth as needed.      . Multiple Vitamin (MULTIVITAMIN) tablet Take 1 tablet by mouth daily.        . Omega-3 Fatty Acids (OMEGA 3 PO) Take by mouth daily.        . Probiotic Product (PROBIOTIC PO) Take by mouth daily.        . vitamin  E 400 UNIT capsule Take 400 Units by mouth daily.         The PMH, PSH, Social History, Family History, Medications, and allergies have been reviewed in [facility], and have been updated if relevant.     Review of Systems See HPI   No CP No SOB Objective:   Physical Exam BP 160/82  Pulse 72  Temp 97.6 F (36.4 C)  Ht 5' 6.5" (1.689 m)  Wt 90 lb (40.824 kg)  BMI 14.31 kg/m2 Wt Readings from Last 3 Encounters:  11/17/12 90 lb (40.824 kg)  10/19/12 91 lb (41.277 kg)  10/17/12 89 lb 12.8 oz (40.733 kg)     Constitutional: She is oriented to person, place, and time. She appears well-developed and well-nourished. No distress.  Anorexically thin.  HENT:  Head: Normocephalic and atraumatic.  Right Ear: + cerumen Left Ear: External ear normal.  Nose: Nose normal.  Mouth/Throat: Oropharynx is clear and moist.  Eyes: Conjunctivae and EOM are normal. Pupils are equal, round, and reactive to light. No scleral icterus.  Neck: Normal range of motion. Neck supple. No thyromegaly present.  Cardiovascular: Normal rate, regular rhythm, normal heart sounds and intact distal pulses.  No murmur heard. Neck with carotid bruits mild bilat.  Pulmonary/Chest: Effort normal and breath sounds normal. No respiratory distress. She has no wheezes.  Abdominal: Soft. Bowel sounds are normal. She exhibits no mass. There is no tenderness.  Musculoskeletal: Normal range of motion.  Lymphadenopathy:  She has no cervical adenopathy.  Neurological: She is alert and oriented to person, place, and time. She exhibits normal muscle tone. Coordination normal.  Skin: Skin is warm and dry. No rash noted. She is not diaphoretic.  Psychiatric: She has a normal mood and affect. Her behavior is normal. Judgment and thought content normal.      Assessment & Plan:   1. Routine general medical examination at a health care facility  Reviewed preventive care protocols, scheduled due services, and updated  immunizations Discussed nutrition, exercise, diet, and healthy lifestyle.  Flu shot Discussed zostavax  2. Osteopenia  Recheck dexa. Continue calcium, vit d and weight bearing exercise.  DG Bone Density  3. Failed hearing screening  Due to cerumen impaction.  See below.   4. HYPERCHOLESTEROLEMIA  Cholesterol stable.   5. Disordered eating  Refer to Dr. Laymond Purser for evaluation and psychotherapy. Ambulatory referral to Psychology  6. Other screening mammogram  MM  Digital Screening  7. Cerumen impaction  Ceruminosis is noted.  Wax is removed by syringing and manual debridement. Instructions for home care to prevent wax buildup are given.

## 2012-11-17 NOTE — Patient Instructions (Addendum)
Check with your insurance to see if they will cover the shingles shot.  Please stop by to see Shirlee Limerick on your way.  Have a great Christmas!

## 2012-11-28 ENCOUNTER — Ambulatory Visit: Payer: Medicare Other | Admitting: Internal Medicine

## 2012-12-06 ENCOUNTER — Ambulatory Visit (INDEPENDENT_AMBULATORY_CARE_PROVIDER_SITE_OTHER): Payer: Medicare Other | Admitting: Internal Medicine

## 2012-12-06 ENCOUNTER — Encounter: Payer: Self-pay | Admitting: Internal Medicine

## 2012-12-06 VITALS — BP 100/62 | HR 75 | Temp 97.6°F | Ht 66.5 in | Wt 92.0 lb

## 2012-12-06 DIAGNOSIS — R05 Cough: Secondary | ICD-10-CM | POA: Diagnosis not present

## 2012-12-06 DIAGNOSIS — J387 Other diseases of larynx: Secondary | ICD-10-CM | POA: Diagnosis not present

## 2012-12-06 MED ORDER — BECLOMETHASONE DIPROPIONATE 80 MCG/ACT IN AERS: 1.0000 | INHALATION_SPRAY | Freq: Two times a day (BID) | RESPIRATORY_TRACT | Status: DC

## 2012-12-06 MED ORDER — FLUTICASONE PROPIONATE 50 MCG/ACT NA SUSP: 2.0000 | Freq: Every day | NASAL | Status: DC

## 2012-12-06 NOTE — Assessment & Plan Note (Signed)
Improved after rX of irritable larynx componenet with gabapentin but still with 50% residual cough. Lack ofresolution due to poor compliance with instructions on sinus, gerd and empiric asthma Rx and neuro rehab speech Rx recs. I have emphasized again the whole plan as below  #Lung nodule  -  You need folluwup ct chest in 7-8 months from Nov 2013 for 5mm Rt lung nodule; CMA will ensure that order was placed at last office visit   #Cough is from sinus drainage, acid reflux and +/- asthma All of this is working together to cause cyclical cough/LPR cough/Irritable Larynx Glad you are better especially that gabapentin is helping you a lot However, you need to follow other instructions 100% to effectively get rid of cough; do not stop anything without checking with [place]  #Sinus drainage  - DO start and continue netti pot daily or atleast 3 times a week at night (OTC product) - - RE-start nasal steroid generic fluticasone inhaler 2 squirts each nostril daily as advised (Rx sent to [facility] in [place])   #Possible Acid Reflux - DO STOP Fish poil  - DO RESTART otc zegerid 20mg   1 capsule daily on empty stomach (if expensive call us)   -  avoid colas, spices, cheeses, spirits, red meats, beer, chocolates, fried foods etc.,   - sleep with head end of bed elevated  - eat small frequent meals  - do not go to bed for 3 hours after last meal  #Possible Asthma  - Will need to know if inhaled steroid definitely did not help - Ok not to take t pulmicort due to expense  - STart QVAR 2 puff twice daily - take samples and script  #Cyclical cough  -- at all times there  there is urge to cough, drink water or swallow or sip on throat lozenge and follow speech therapy advice - glad you attended speech therapy class but you need to follow their advice at all times - continue gabapentin 300mg  three times daily to continue; glad is helping you and glad not much side effect.   #Followup - I will see  you in 4-6 weeks. - Cough score at followup  - any problems call or come sooner

## 2012-12-06 NOTE — Progress Notes (Signed)
Subjective:    Patient ID: Haley Kim, female    DOB: 20-Jul-1946, 67 y.o.   MRN: [id number]  HPI PMD is Ruthe Mannan, MD  Body mass index is 14.10 kg/(m^2).   reports that she has never smoked. She has never used smokeless tobacco.   IOV 09/07/2012  Referred for copd. BUt main complaint/symptoms is  cough when she saw PMD. Then testing with CXr and spirometry showed "COPD" and therefore referred here. In addition she has 12 other symptoms on printed sheet including dyspnea, atypical chest pain, pain in back, poor energy, waking up nauseated, frequent headaches, poor appetite, poor taste (everything tastes like mush), anosmia, staying in bed 12-15h/day (fatigue) and post prandial pain (reportedly has IBS). She has lost weight without intention. Current Body mass index is 14.10 kg/(m^2).   AT this visit, we focussed on cough  Cough. Insidious onset several years ago. Rates cough as severe. Progressive esp last 2 years. Quality is described as a shallow hacking cough.  Coughs constantly. Usually episodes are spontaneously. Worse when she lies down but also worsened by drinking something cold. Somewhat improved by warm air but no other relieving factors. There is associated clearing of throat a lot, and a feeling of sand in chest and the need to bring it out. However, mucus volume is very small and is clear and think. There is also associated wheezing and an occasional feeling of tickle in throat. Hx is alsp positive for mucus in throat but no gag and night sweats.   RSI cough score is 33 and c/w LPR cough. Kouffman differrentiator 2 forGERD and 4 for neurogenic/airwa cough. COPD CAT score 25 Exhaled nitric oxide 17 on 09/07/2012 (on and off advair x 2 wkeeks) and LOW PROB for asthma   BP  not on ace inhibitor  Sinus hx - no hx of seeing ENT doctor  - but has hx of chronic anterior nasal drainage +, poor sense of smell +, but no blockage hx - not on Rx  Allergy hx  - chocolate -  headaches  - runny nose during pollen season. Has hx of allergies and allergy shots in 1970s  - never seen allergist since 1970s  GERD hx  - hiatal hernia 2 months ago at time of Endoscopy by Dr Clearence Ped - not on PPI - noted to be on omega-3  Lung Disease  - recent dx of "copd" based on cxr and spirometry but she never smoked  - currently on advair trial 5 days - no help.  Hx of mdi trial several years ago no help - never smoked. Husband quit smoking 1970 but prior to that never smoked inside house. She has been married to him 2.      LABS CXR 06/13/12 - severe hyperinflation  Spiroometry 08/16/12   - fev1 1.2L/50%, FVC 1.8L/58%, Ratio 66  Walk test in office 09/07/2012  - pulse ox 98% at rest with HR 91. After 185 x 3 lpas pulse ox 93%, HR 113  REC Please stop advair  Please have PFT breathing test - after test, have them leave it on my desk. 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 Will get CT chest  Telephone call 10/14/12 Ct shows hyperinflation and one small 5mm Caviatary nodule in RML:/RLL image 33 that needs fu. Nothing in ct explains cough. Please have her finish ENT appt and see me for fu to discuss status so far and go over ct results  OV 10/17/2012  Saw [facility] - sinus ct normal. GERD felt to be etiology. Discharged from followup  Cough - no better though RSI score down to lack of heartburn but husband says she has poor appetite and not eating so no heart burn. Poor appetite is a major issue    #Lung nodule  - You need folluwup ct chest in 7-8 months for 5mm Rt lung nodule  #Cough is from  sinus drainage, acid reflux and +/- asthma  All of this is working together to cause cyclical cough/LPR cough/Irritable Larynx  To effectively get rid of cough you need to follow below instructions 100%  #Sinus drainage  - start/continue netti pot daily or atleast 3 times a week at night (OTC product)  - - start nasal steroid generic fluticasone inhaler 2 squirts each nostril daily as advised (Rx sent to [facility] in [place])  #Possible Acid Reflux  - STOP Fish poil  - take otc zegerid 20mg  1 capsule daily on empty stomach (if expensive call us)  - avoid colas, spices, cheeses, spirits, red meats, beer, chocolates, fried foods etc.,  - sleep with head end of bed elevated  - eat small frequent meals  - do not go to bed for 3 hours after last meal  #Possible Asthma  - start pulmicort 2 puff twice daily (sent to [facility] in [place])  #Cyclical cough  - please choose 2-3 days and observe complete voice rest - no talking or whispering  - at all times there there is urge to cough, drink water or swallow or sip on throat lozenge  - referred to neuro rehab  -Take gabapentin 300mg  once daily x 3 days, then 300mg  twice daily x 3 days, then 300mg  three times daily to continue. If this makes you too sleepy or drowsy call us and we will cut your medication dosing down (CMA will send to [facility])  3#Followup  -3 I will see you in 4-6 weeks.  - Cough score at followup  - any problems call or come sooner     OV 12/06/2012  Followup Cough. Overall 50% better since original visit and feels gabapentin helped a lot. In fact she feels this is the only thing that helps cough. However, she is not fully compliant with all of above instructions. For nasal issues: she is NOT doing netti pot for no reason. She tried nasal steroid for a month but then stopped because of "no help". In terms of GERD: she stil taking Fish oil. Says she did not know she had to stop (she and husband later apologized for not reading  instruction clearly). Took zegerid for a month and then stopped due to "no help". In terms of potential obs lung dz: says pulmicort too expensive. One month trial did not helpo. In terms of cyclical cough: gaabpentin helps. Went to speech Rx but not following their instructions  No interims issues. No other complaints  Dr Gretta Cool Reflux Symptom Index (> 13-15 suggestive of LPR cough) 09/07/12 10/17/2012  12/06/2012 6 weeks gabapentin  Hoarseness of problem with voice 5 4 3   Clearing  Of Throat 5 4 3   Excess throat mucus or feeling of  post nasal drip 5 5 3   Difficulty swallowing food, liquid or tablets 0 0 3  Cough after eating or lying down 4 5 1    Breathing difficulties or choking episodes 4 2 0  Troublesome or annoying cough 5 5 1   Sensation of something sticking in throat or lump in throat 0 0 0  Heartburn, chest pain, indigestion, or stomach acid coming up 5 0 0  TOTAL 33 25 14     Kouffman Reflux v Neurogenic Cough Differentiator Reflux 09/07/12 10/17/2012  12/06/2012   Do you awaken from a sound sleep coughing violently?                            With trouble breathing?   n  Do you have choking episodes when you cannot  Get enough air, gasping for air ?                n  Do you usually cough when you lie down into  The bed, or when you just lie down to rest ?                          Yes Yes, immediate y  Do you usually cough after meals or eating?         Yes  n  Do you cough when (or after) you bend over?      n  GERD SCORE  2 2 1   Kouffman Reflux v Neurogenic Cough Differentiator Neurogenic    Do you more-or-less cough all day long?   n  Does change of temperature make you cough? y y Y, mostly cold  Does laughing or chuckling cause you to cough? y n n  Do fumes (perfume, automobile fumes, burned  Toast, etc.,) cause you to cough ?      y y n  Does speaking, singing, or talking on the phone cause you to cough   ?               y y Yes, occassionally  Neurogenic/Airway  score 4 3 2        Past, Family, Social reviewed: no change since last visit    Review of Systems  Constitutional: Negative for fever and unexpected weight change.  HENT: Negative for ear pain, nosebleeds, congestion, sore throat, rhinorrhea, sneezing, trouble swallowing, dental problem, postnasal drip and sinus pressure.   Eyes: Negative for redness and itching.  Respiratory: Positive for cough. Negative for chest tightness, shortness of breath and wheezing.   Cardiovascular: Negative for palpitations and leg swelling.  Gastrointestinal: Negative for nausea and vomiting.  Genitourinary: Negative for dysuria.  Musculoskeletal: Negative for joint swelling.  Skin: Negative for rash.  Neurological: Negative for headaches.  Hematological: Does not bruise/bleed easily.  Psychiatric/Behavioral: Negative for dysphoric mood. The patient is not nervous/anxious.        Objective:   Physical Exam  Vitals reviewed. Constitutional: She is oriented to person, place, and time. She appears well-developed and well-nourished. No distress.       Body mass index is 14.63 kg/(m^2).   HENT:  Head: Normocephalic and atraumatic.  Right Ear: External ear normal.  Left Ear: External ear normal.  Mouth/Throat: Oropharynx is clear and moist. No oropharyngeal exudate.       Mild sinus drainage +  Eyes: Conjunctivae normal and EOM are normal. Pupils are equal, round, and reactive to light. Right eye  exhibits no discharge. Left eye exhibits no discharge. No scleral icterus.  Neck: Normal range of motion. Neck supple. No JVD present. No tracheal deviation present. No thyromegaly present.  Cardiovascular: Normal rate, regular rhythm, normal heart sounds and intact distal pulses.  Exam reveals no gallop and no friction rub.   No murmur heard. Pulmonary/Chest: Effort normal and breath sounds normal. No respiratory distress. She has no wheezes. She has no rales. She exhibits no tenderness.  Abdominal: Soft.  Bowel sounds are normal. She exhibits no distension and no mass. There is no tenderness. There is no rebound and no guarding.  Musculoskeletal: Normal range of motion. She exhibits no edema and no tenderness.  Lymphadenopathy:    She has no cervical adenopathy.  Neurological: She is alert and oriented to person, place, and time. She has normal reflexes. No cranial nerve deficit. She exhibits normal muscle tone. Coordination normal.  Skin: Skin is warm and dry. No rash noted. She is not diaphoretic. No erythema. No pallor.  Psychiatric: She has a normal mood and affect. Her behavior is normal. Judgment and thought content normal.        Assessment & Plan:

## 2012-12-06 NOTE — Addendum Note (Signed)
Addended by: Darrell Jewel on: 12/06/2012 04:53 PM   Modules accepted: Orders

## 2012-12-06 NOTE — Patient Instructions (Addendum)
#  Lung nodule  -  You need folluwup ct chest in 7-8 months from Nov 2013 for 5mm Rt lung nodule; CMA will ensure that order was placed at last office visit   #Cough is from sinus drainage, acid reflux and +/- asthma All of this is working together to cause cyclical cough/LPR cough/Irritable Larynx Glad you are better especially that gabapentin is helping you a lot However, you need to follow other instructions 100% to effectively get rid of cough; do not stop anything without checking with [place]  #Sinus drainage  - DO start and continue netti pot daily or atleast 3 times a week at night (OTC product) - - RE-start nasal steroid generic fluticasone inhaler 2 squirts each nostril daily as advised (Rx sent to [facility] in [place])   #Possible Acid Reflux - DO STOP Fish poil  - DO RESTART otc zegerid 20mg   1 capsule daily on empty stomach (if expensive call us)   -  avoid colas, spices, cheeses, spirits, red meats, beer, chocolates, fried foods etc.,   - sleep with head end of bed elevated  - eat small frequent meals  - do not go to bed for 3 hours after last meal  #Possible Asthma  - Will need to know if inhaled steroid definitely did not help - Ok not to take t pulmicort due to expense  - STart QVAR 2 puff twice daily - take samples and script  #Cyclical cough  -- at all times there  there is urge to cough, drink water or swallow or sip on throat lozenge and follow speech therapy advice - glad you attended speech therapy class but you need to follow their advice at all times - continue gabapentin 300mg  three times daily to continue; glad is helping you and glad not much side effect.   #Followup - I will see you in 4-6 weeks. - Cough score at followup  - any problems call or come sooner

## 2012-12-08 ENCOUNTER — Telehealth: Payer: Self-pay | Admitting: Internal Medicine

## 2012-12-08 NOTE — Telephone Encounter (Signed)
Pt is aware that I have changed her pharmacy in her chart.

## 2012-12-12 IMAGING — CT CT CHEST W/O CM
2 of 4 series · 15 of 36 positions shown, 18 images · IV contrast (Omnipaque 300)
Comparison: None.

CLINICAL DATA: Chronic productive cough with worsening over the
last year.  Abnormal pulmonary function tests.  Increasing
shortness of breath.

CT CHEST WITHOUT CONTRAST
TECHNIQUE: Multidetector CT imaging of the chest was performed
following the standard protocol without IV contrast.

[Series 2: chest routine with · axial · 0.63mm/px · z∈[-311,-11]mm · 12 of 72 slices shown, 15 images]
[im 6/72  mediastinal]
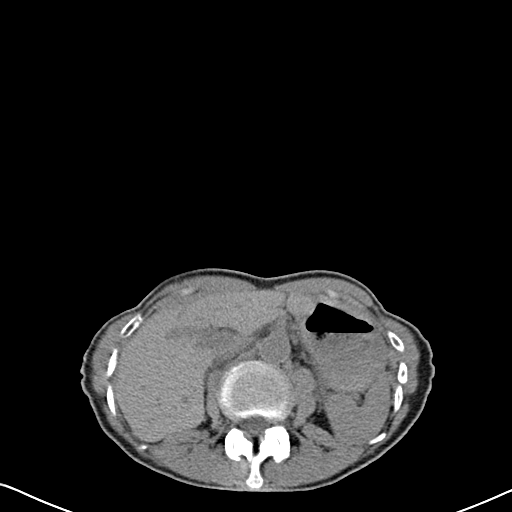
[im 6/72  lung]
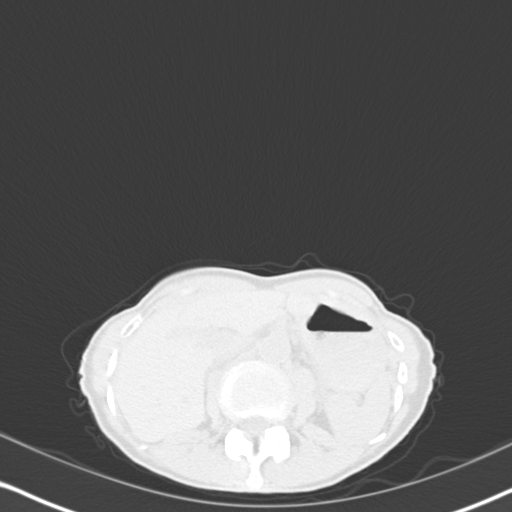
[im 11/72  lung]
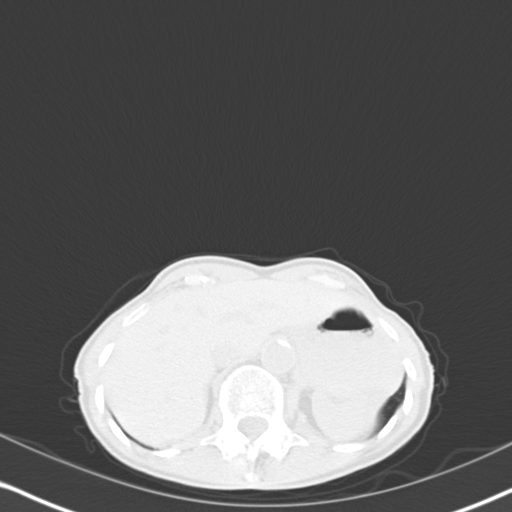
[im 17/72  lung]
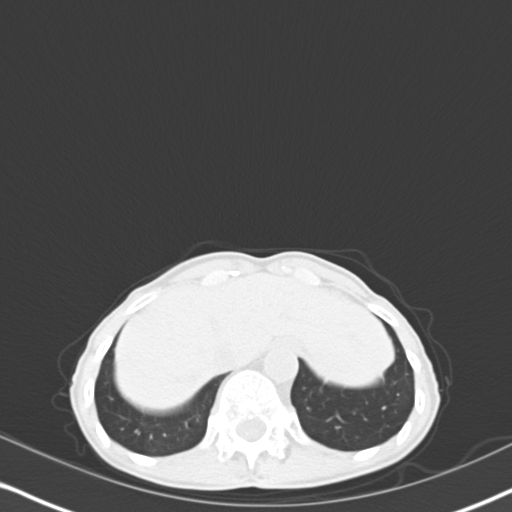
[im 22/72  lung]
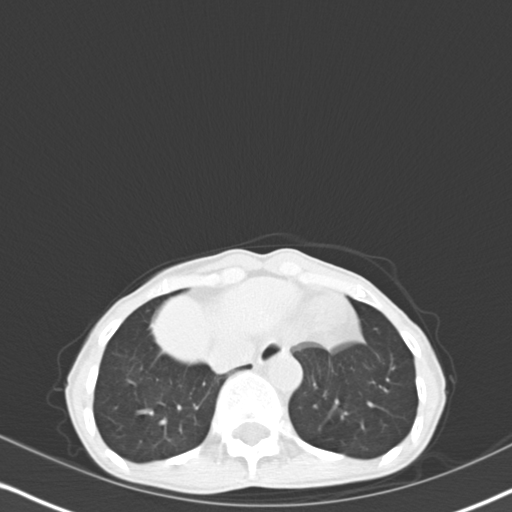
[im 28/72  mediastinal]
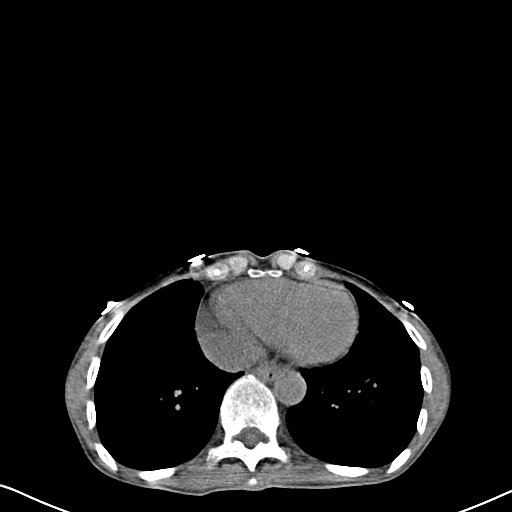
[im 28/72  lung]
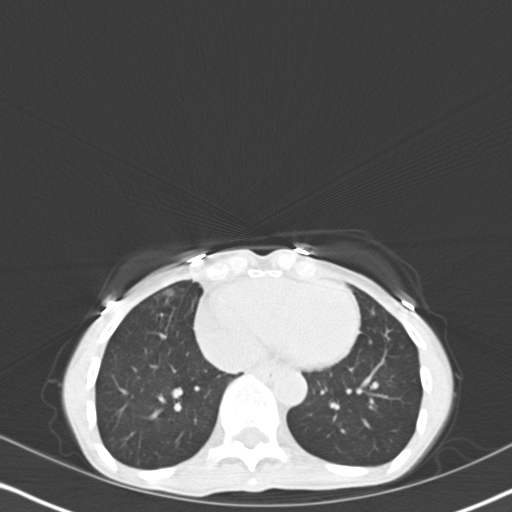
[im 33/72  lung]
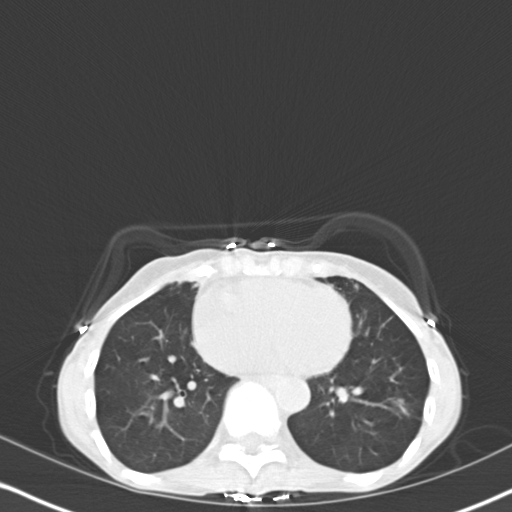
[im 39/72  lung]
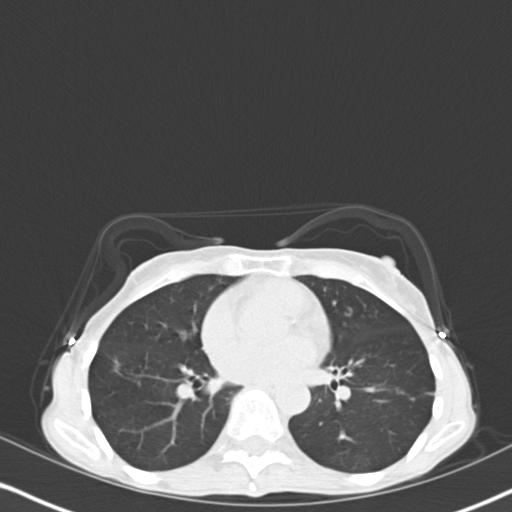
[im 44/72  lung]
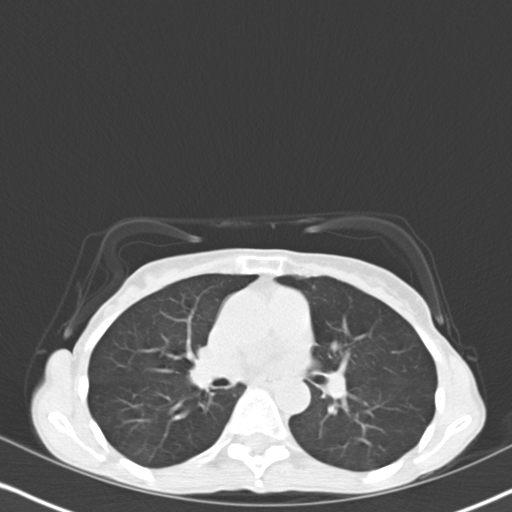
[im 50/72  mediastinal]
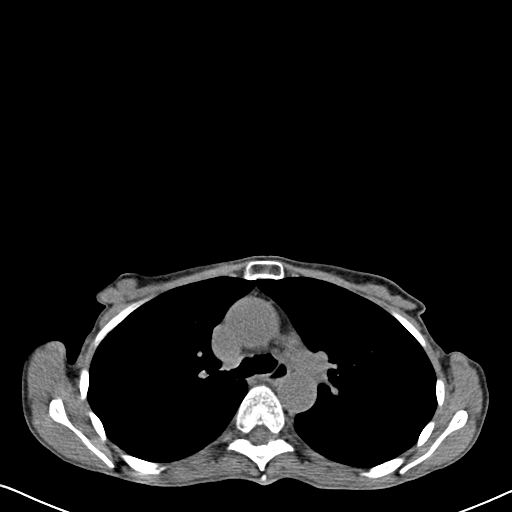
[im 50/72  lung]
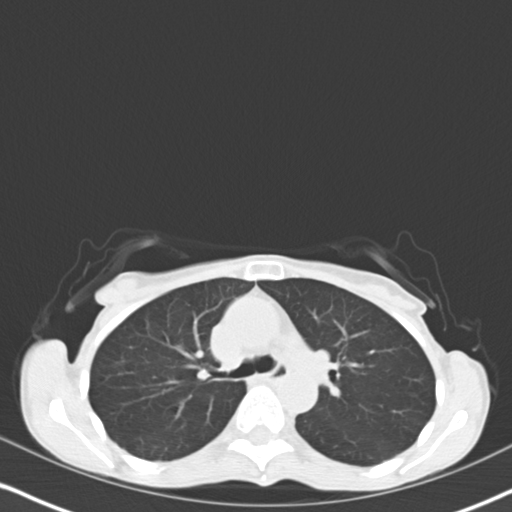
[im 55/72  lung]
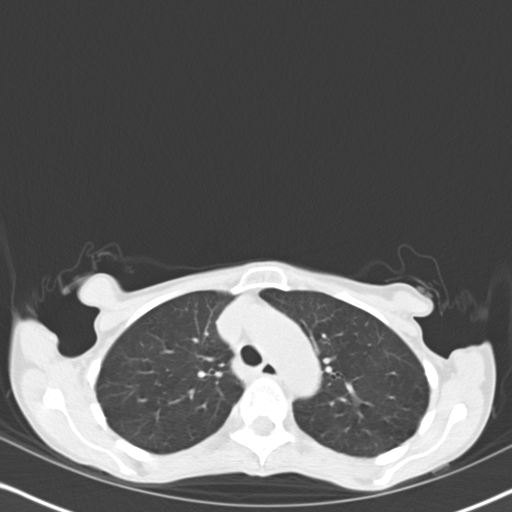
[im 61/72  lung]
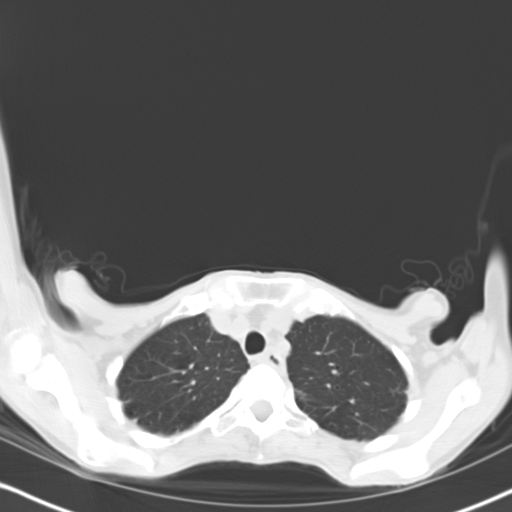
[im 66/72  lung]
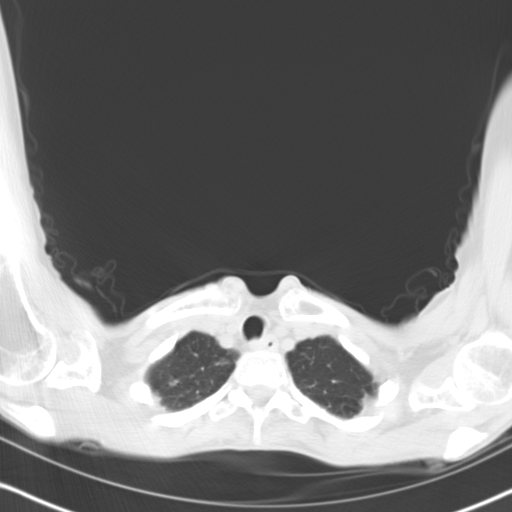

[Series 602: cor · coronal · 0.70mm/px · 3 of 71 slices shown]
[im 15/71  lung]
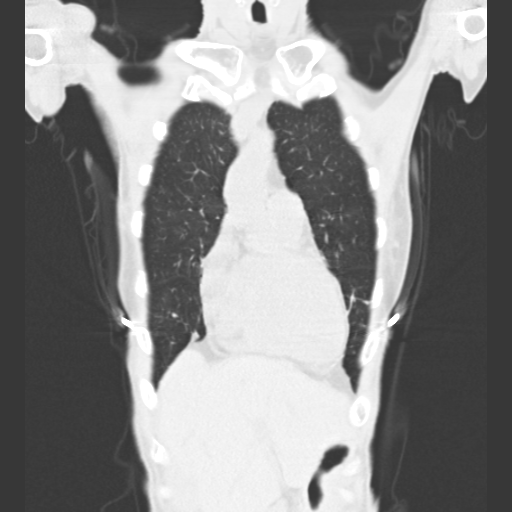
[im 29/71  lung]
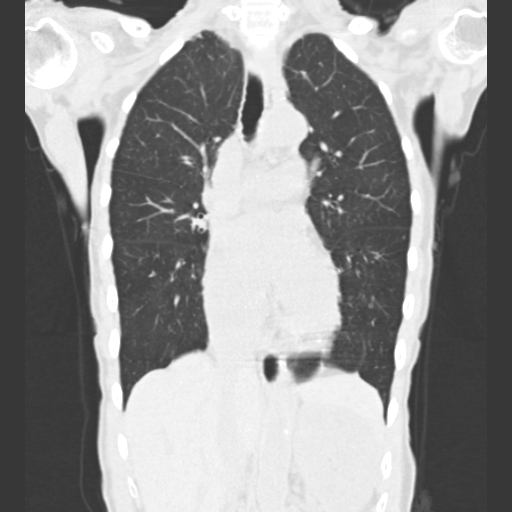
[im 43/71  lung]
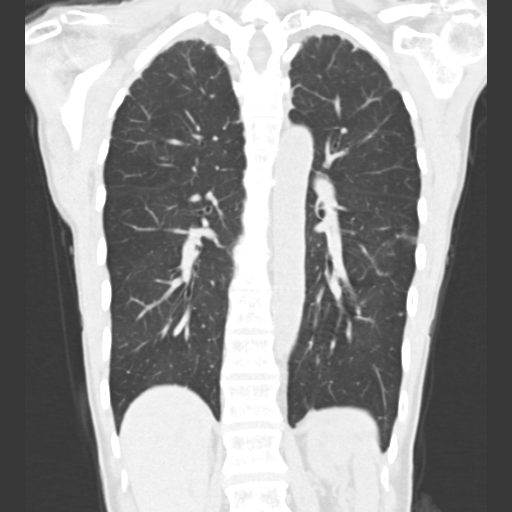

[15 of 36 positions shown; findings below may reference images not displayed]

FINDINGS: No pathologically enlarged mediastinal or axillary lymph
nodes.  Hilar regions are difficult to definitively evaluate
without IV contrast.  Heart size normal.  No pericardial effusion.

Biapical pleural parenchymal scarring.  Mild scattered
bronchiectasis, volume loss and nodularity, bibasilar dependent.
At least one calcified granuloma in the right lower lobe. A 5 mm
cavitary appearing nodule is seen in the right lower lobe (image
33).  No definite subpleural reticulation.  No pleural fluid.
Airway is unremarkable.

Incidental imaging of the upper abdomen shows no acute findings.
No worrisome lytic or sclerotic lesions.
IMPRESSION: 1.  Scattered mild bronchiectasis, volume loss and nodularity,
bibasilar dependent.  Findings may be post infectious or post
inflammatory in etiology.  Mycobacterium avium complex (BERBERAJ) is
another consideration.
2.  5 mm cavitary appearing right lower lobe nodule. If the patient
is at high risk for bronchogenic carcinoma, follow-up chest CT at 6-
12 months is recommended.  If the patient is at low risk for
bronchogenic carcinoma, follow-up chest CT at 12 months is
recommended.  This recommendation follows the consensus statement:
Guidelines for Management of Small Pulmonary Nodules Detected on CT
Scans: A Statement from the [HOSPITAL] as published in

## 2012-12-14 ENCOUNTER — Telehealth: Payer: Self-pay | Admitting: Internal Medicine

## 2012-12-14 DIAGNOSIS — J387 Other diseases of larynx: Secondary | ICD-10-CM

## 2012-12-14 DIAGNOSIS — R05 Cough: Secondary | ICD-10-CM

## 2012-12-14 NOTE — Telephone Encounter (Signed)
Called and spoke with pt and she is aware that we will discuss with MR and scheduled the referral for the pt.  Victorino Dike do you know what referral we need to do for neuro rehab?  Pt is aware we will call her back tomorrow to let her know that the referral has been sent.  thanks

## 2012-12-15 ENCOUNTER — Encounter (INDEPENDENT_AMBULATORY_CARE_PROVIDER_SITE_OTHER): Payer: Medicare Other

## 2012-12-15 DIAGNOSIS — R0989 Other specified symptoms and signs involving the circulatory and respiratory systems: Secondary | ICD-10-CM | POA: Diagnosis not present

## 2012-12-15 DIAGNOSIS — I6529 Occlusion and stenosis of unspecified carotid artery: Secondary | ICD-10-CM | POA: Diagnosis not present

## 2012-12-15 NOTE — Telephone Encounter (Signed)
Order placed. Jennifer Castillo, CMA  

## 2012-12-16 ENCOUNTER — Ambulatory Visit: Payer: Medicare Other | Attending: Internal Medicine

## 2012-12-16 DIAGNOSIS — IMO0001 Reserved for inherently not codable concepts without codable children: Secondary | ICD-10-CM | POA: Insufficient documentation

## 2012-12-16 DIAGNOSIS — R498 Other voice and resonance disorders: Secondary | ICD-10-CM | POA: Diagnosis not present

## 2012-12-20 ENCOUNTER — Ambulatory Visit: Payer: Self-pay | Admitting: Family Medicine

## 2012-12-20 DIAGNOSIS — R928 Other abnormal and inconclusive findings on diagnostic imaging of breast: Secondary | ICD-10-CM | POA: Diagnosis not present

## 2012-12-20 DIAGNOSIS — Z1231 Encounter for screening mammogram for malignant neoplasm of breast: Secondary | ICD-10-CM | POA: Diagnosis not present

## 2012-12-21 ENCOUNTER — Encounter: Payer: Self-pay | Admitting: Family Medicine

## 2012-12-21 ENCOUNTER — Telehealth: Payer: Self-pay | Admitting: Internal Medicine

## 2012-12-21 MED ORDER — BECLOMETHASONE DIPROPIONATE 80 MCG/ACT IN AERS: 2.0000 | INHALATION_SPRAY | Freq: Two times a day (BID) | RESPIRATORY_TRACT | Status: DC

## 2012-12-21 NOTE — Telephone Encounter (Signed)
I spoke with pt. She stated when she p/u her rx for QVAR it stated 1 puff BID. According to MR last OV she is suppose to take 2 puffs BID. I advised pt she is suppose to take 2 puffs BID and I have called the pharmacy to fix this. Nothing further was needed.

## 2012-12-22 ENCOUNTER — Encounter: Payer: Self-pay | Admitting: *Deleted

## 2012-12-22 ENCOUNTER — Encounter: Payer: Self-pay | Admitting: Family Medicine

## 2012-12-22 ENCOUNTER — Ambulatory Visit: Payer: Self-pay | Admitting: Family Medicine

## 2012-12-22 DIAGNOSIS — M899 Disorder of bone, unspecified: Secondary | ICD-10-CM | POA: Diagnosis not present

## 2012-12-26 ENCOUNTER — Ambulatory Visit: Payer: Medicare Other

## 2012-12-26 ENCOUNTER — Encounter: Payer: Self-pay | Admitting: Family Medicine

## 2012-12-30 ENCOUNTER — Ambulatory Visit: Payer: Medicare Other

## 2012-12-30 ENCOUNTER — Encounter: Payer: Self-pay | Admitting: *Deleted

## 2013-01-02 ENCOUNTER — Ambulatory Visit: Payer: Medicare Other | Attending: Internal Medicine | Admitting: Speech Pathology

## 2013-01-02 DIAGNOSIS — R498 Other voice and resonance disorders: Secondary | ICD-10-CM | POA: Diagnosis not present

## 2013-01-02 DIAGNOSIS — IMO0001 Reserved for inherently not codable concepts without codable children: Secondary | ICD-10-CM | POA: Diagnosis not present

## 2013-01-03 ENCOUNTER — Ambulatory Visit: Payer: Medicare Other | Admitting: Internal Medicine

## 2013-01-04 ENCOUNTER — Ambulatory Visit: Payer: Medicare Other

## 2013-01-04 DIAGNOSIS — R498 Other voice and resonance disorders: Secondary | ICD-10-CM | POA: Diagnosis not present

## 2013-01-04 DIAGNOSIS — IMO0001 Reserved for inherently not codable concepts without codable children: Secondary | ICD-10-CM | POA: Diagnosis not present

## 2013-01-10 ENCOUNTER — Ambulatory Visit: Payer: Medicare Other

## 2013-01-10 DIAGNOSIS — R498 Other voice and resonance disorders: Secondary | ICD-10-CM | POA: Diagnosis not present

## 2013-01-10 DIAGNOSIS — IMO0001 Reserved for inherently not codable concepts without codable children: Secondary | ICD-10-CM | POA: Diagnosis not present

## 2013-01-12 ENCOUNTER — Ambulatory Visit: Payer: Medicare Other

## 2013-01-17 ENCOUNTER — Encounter: Payer: Self-pay | Admitting: Internal Medicine

## 2013-01-17 ENCOUNTER — Ambulatory Visit (INDEPENDENT_AMBULATORY_CARE_PROVIDER_SITE_OTHER): Payer: Medicare Other | Admitting: Internal Medicine

## 2013-01-17 VITALS — BP 120/70 | HR 79 | Temp 97.6°F | Ht 66.5 in | Wt 95.2 lb

## 2013-01-17 DIAGNOSIS — R059 Cough, unspecified: Secondary | ICD-10-CM

## 2013-01-17 DIAGNOSIS — R911 Solitary pulmonary nodule: Secondary | ICD-10-CM | POA: Diagnosis not present

## 2013-01-17 DIAGNOSIS — R05 Cough: Secondary | ICD-10-CM | POA: Diagnosis not present

## 2013-01-17 NOTE — Progress Notes (Signed)
Subjective:    Patient ID: Haley Kim, female    DOB: 1946-06-02, 67 y.o.   MRN: [id number]  HPI PMD is Ruthe Mannan, MD  Body mass index is 14.10 kg/(m^2).   reports that she has never smoked. She has never used smokeless tobacco.   IOV 09/07/2012  Referred for copd. BUt main complaint/symptoms is  cough when she saw PMD. Then testing with CXr and spirometry showed "COPD" and therefore referred here. In addition she has 12 other symptoms on printed sheet including dyspnea, atypical chest pain, pain in back, poor energy, waking up nauseated, frequent headaches, poor appetite, poor taste (everything tastes like mush), anosmia, staying in bed 12-15h/day (fatigue) and post prandial pain (reportedly has IBS). She has lost weight without intention. Current Body mass index is 14.10 kg/(m^2).   AT this visit, we focussed on cough  Cough. Insidious onset several years ago. Rates cough as severe. Progressive esp last 2 years. Quality is described as a shallow hacking cough.  Coughs constantly. Usually episodes are spontaneously. Worse when she lies down but also worsened by drinking something cold. Somewhat improved by warm air but no other relieving factors. There is associated clearing of throat a lot, and a feeling of sand in chest and the need to bring it out. However, mucus volume is very small and is clear and think. There is also associated wheezing and an occasional feeling of tickle in throat. Hx is alsp positive for mucus in throat but no gag and night sweats.   RSI cough score is 33 and c/w LPR cough. Kouffman differrentiator 2 forGERD and 4 for neurogenic/airwa cough. COPD CAT score 25 Exhaled nitric oxide 17 on 09/07/2012 (on and off advair x 2 wkeeks) and LOW PROB for asthma   BP  not on ace inhibitor  Sinus hx - no hx of seeing ENT doctor  - but has hx of chronic anterior nasal drainage +, poor sense of smell +, but no blockage hx - not on Rx  Allergy hx  - chocolate -  headaches  - runny nose during pollen season. Has hx of allergies and allergy shots in 1970s  - never seen allergist since 1970s  GERD hx  - hiatal hernia 2 months ago at time of Endoscopy by Dr Clearence Ped - not on PPI - noted to be on omega-3  Lung Disease  - recent dx of "copd" based on cxr and spirometry but she never smoked  - currently on advair trial 5 days - no help.  Hx of mdi trial several years ago no help - never smoked. Husband quit smoking 1970 but prior to that never smoked inside house. She has been married to him 32.      LABS CXR 06/13/12 - severe hyperinflation  Spiroometry 08/16/12   - fev1 1.2L/50%, FVC 1.8L/58%, Ratio 66  Walk test in office 09/07/2012  - pulse ox 98% at rest with HR 91. After 185 x 3 lpas pulse ox 93%, HR 113  REC Please stop advair  Please have PFT breathing test - after test, have them leave it on my desk. 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 Will get CT chest  Telephone call 10/14/12 Ct shows hyperinflation and one small 5mm Caviatary nodule in RML:/RLL image 33 that needs fu. Nothing in ct explains cough. Please have her finish ENT appt and see me for fu to discuss status so far and go over ct results  OV 10/17/2012  Saw [facility] - sinus ct normal. GERD felt to be etiology. Discharged from followup  Cough - no better though RSI score down to lack of heartburn but husband says she has poor appetite and not eating so no heart burn. Poor appetite is a major issue    #Lung nodule  - You need folluwup ct chest in 7-8 months for 5mm Rt lung nodule  #Cough is from  sinus drainage, acid reflux and +/- asthma  All of this is working together to cause cyclical cough/LPR cough/Irritable Larynx  To effectively get rid of cough you need to follow below instructions 100%  #Sinus drainage  - start/continue netti pot daily or atleast 3 times a week at night (OTC product)  - - start nasal steroid generic fluticasone inhaler 2 squirts each nostril daily as advised (Rx sent to [facility] in [place])  #Possible Acid Reflux  - STOP Fish poil  - take otc zegerid 20mg  1 capsule daily on empty stomach (if expensive call us)  - avoid colas, spices, cheeses, spirits, red meats, beer, chocolates, fried foods etc.,  - sleep with head end of bed elevated  - eat small frequent meals  - do not go to bed for 3 hours after last meal  #Possible Asthma  - start pulmicort 2 puff twice daily (sent to [facility] in [place])  #Cyclical cough  - please choose 2-3 days and observe complete voice rest - no talking or whispering  - at all times there there is urge to cough, drink water or swallow or sip on throat lozenge  - referred to neuro rehab  -Take gabapentin 300mg  once daily x 3 days, then 300mg  twice daily x 3 days, then 300mg  three times daily to continue. If this makes you too sleepy or drowsy call us and we will cut your medication dosing down (CMA will send to [facility])  3#Followup  -3 I will see you in 4-6 weeks.  - Cough score at followup  - any problems call or come sooner     OV 12/06/2012  Followup Cough. Overall 50% better since original visit and feels gabapentin helped a lot. In fact she feels this is the only thing that helps cough. However, she is not fully compliant with all of above instructions. For nasal issues: she is NOT doing netti pot for no reason. She tried nasal steroid for a month but then stopped because of "no help". In terms of GERD: she stil taking Fish oil. Says she did not know she had to stop (she and husband later apologized for not reading  instruction clearly). Took zegerid for a month and then stopped due to "no help". In terms of potential obs lung dz: says pulmicort too expensive. One month trial did not helpo. In terms of cyclical cough: gaabpentin helps. Went to speech Rx but not following their instructions  No interims issues. No other complaints   REC  #Lung nodule  - You need folluwup ct chest in 7-8 months from Nov 2013 for 5mm Rt lung nodule; CMA will ensure that order was placed at last office visit    #Cough is from sinus drainage,  acid reflux and +/- asthma  All of this is working together to cause cyclical cough/LPR cough/Irritable Larynx  Glad you are better especially that gabapentin is helping you a lot  However, you need to follow other instructions 100% to effectively get rid of cough; do not stop anything without checking with [place]  #Sinus drainage  - DO start and continue netti pot daily or atleast 3 times a week at night (OTC product)  - - RE-start nasal steroid generic fluticasone inhaler 2 squirts each nostril daily as advised (Rx sent to [facility] in [place])  #Possible Acid Reflux  - DO STOP Fish poil  - DO RESTART otc zegerid 20mg  1 capsule daily on empty stomach (if expensive call us)  - avoid colas, spices, cheeses, spirits, red meats, beer, chocolates, fried foods etc.,  - sleep with head end of bed elevated  - eat small frequent meals  - do not go to bed for 3 hours after last meal  #Possible Asthma  - Will need to know if inhaled steroid definitely did not help  - Ok not to take t pulmicort due to expense  - STart QVAR 2 puff twice daily - take samples and script  #Cyclical cough  -- at all times there there is urge to cough, drink water or swallow or sip on throat lozenge and follow speech therapy advice  - glad you attended speech therapy class but you need to follow their advice at all times  - continue gabapentin 300mg  three times daily to continue; glad is helping you and glad  not much side effect.  #Followup  - I will see you in 4-6 weeks.  - Cough score at followup  - any problems call or come sooner     OV 01/17/2013   Followup chronic cough  - She is now more compliant with sinus measures as, acid reflux measures, Qvar and gabapentin. She is also attending speech therapy. With all this she says cough is much improved and is at a level but she is quite happy with her quality of life. Although she might be happy with a mild further improvement in cough. On objective questioning the RSI cough score is unchanged as seen below compared to last visit. She thinks as does her husband that gabapentin has made the biggest difference. Though she is sensitive to many medications he is willing to increase the dose of gabapentin. She does not think Qvar has helped at all and wants to stop this. She stopped proton pump inhibitor but is willing to continue that  Dr Gretta Cool Reflux Symptom Index (> 13-15 suggestive of LPR cough) 09/07/12 10/17/2012 Will Start gapabentin and speech Rx 12/06/2012 S/p 6 weeks gabapentin 01/17/2013   Hoarseness of problem with voice 5 4 3 2   Clearing  Of Throat 5 4 3 3   Excess throat mucus or feeling of post nasal drip 5 5 3 3   Difficulty swallowing food, liquid or tablets 0 0 3 0  Cough after eating or lying down 4 5 1 4    Breathing difficulties or choking episodes 4 2 0 0  Troublesome or annoying cough 5 5 1 3   Sensation of something sticking in throat or lump in throat 0 0 0 0  Heartburn, chest pain, indigestion, or stomach acid coming up 5 0 0 2  TOTAL 33 25 14 17      Kouffman Reflux v Neurogenic Cough Differentiator Reflux 09/07/12 10/17/2012  12/06/2012  01/17/2013   Do you awaken from  a sound sleep coughing violently?                            With trouble breathing?   n n  Do you have choking episodes when you cannot  Get enough air, gasping for air ?                n n  Do you usually cough when you lie down into  The bed, or when  you just lie down to rest ?                          Yes Yes, immediate y y  Do you usually cough after meals or eating?         Yes  n n  Do you cough when (or after) you bend over?      n n  GERD SCORE  2 2 1 1   Kouffman Reflux v Neurogenic Cough Differentiator Neurogenic     Do you more-or-less cough all day long?   n n  Does change of temperature make you cough? y y Y, mostly cold y  Does laughing or chuckling cause you to cough? y n n n  Do fumes (perfume, automobile fumes, burned  Toast, etc.,) cause you to cough ?      y y n y  Does speaking, singing, or talking on the phone cause you to cough   ?               y y Yes, occassionally Yes, occ  Neurogenic/Airway score 4 3 2  2.5       Past, Family, Social reviewed: no change since last visit     Review of Systems  Constitutional: Negative for fever and unexpected weight change.  HENT: Positive for congestion and rhinorrhea. Negative for ear pain, nosebleeds, sore throat, sneezing, trouble swallowing, dental problem, postnasal drip and sinus pressure.   Eyes: Negative for redness and itching.  Respiratory: Positive for cough and shortness of breath. Negative for chest tightness and wheezing.   Cardiovascular: Negative for palpitations and leg swelling.  Gastrointestinal: Negative for nausea and vomiting.  Genitourinary: Negative for dysuria.  Musculoskeletal: Negative for joint swelling.  Skin: Negative for rash.  Neurological: Negative for headaches.  Hematological: Does not bruise/bleed easily.  Psychiatric/Behavioral: Negative for dysphoric mood. The patient is not nervous/anxious.        Objective:   Physical Exam Vitals reviewed. Constitutional: She is oriented to person, place, and time. She appears well-developed and well-nourished. No distress.       Body mass index is 14.63 kg/(m^2).   HENT:  Head: Normocephalic and atraumatic.  Right Ear: External ear normal.  Left Ear: External ear normal.   Mouth/Throat: Oropharynx is clear and moist. No oropharyngeal exudate.       Mild sinus drainage +  resolved Eyes: Conjunctivae normal and EOM are normal. Pupils are equal, round, and reactive to light. Right eye exhibits no discharge. Left eye exhibits no discharge. No scleral icterus.  Neck: Normal range of motion. Neck supple. No JVD present. No tracheal deviation present. No thyromegaly present.  Cardiovascular: Normal rate, regular rhythm, normal heart sounds and intact distal pulses.  Exam reveals no gallop and no friction rub.   No murmur heard. Pulmonary/Chest: Effort normal and breath sounds normal. No respiratory distress. She has no wheezes. She has no rales. She exhibits no  tenderness.  Abdominal: Soft. Bowel sounds are normal. She exhibits no distension and no mass. There is no tenderness. There is no rebound and no guarding.  Musculoskeletal: Normal range of motion. She exhibits no edema and no tenderness.  Lymphadenopathy:    She has no cervical adenopathy.  Neurological: She is alert and oriented to person, place, and time. She has normal reflexes. No cranial nerve deficit. She exhibits normal muscle tone. Coordination normal.  Skin: Skin is warm and dry. No rash noted. She is not diaphoretic. No erythema. No pallor.  Psychiatric: She has a normal mood and affect. Her behavior is normal. Judgment and thought content normal.           Assessment & Plan:

## 2013-01-17 NOTE — Patient Instructions (Addendum)
Glad you're feeling better in terms of her cough For sinus  - Continue nasal steroid and please to try to start Nettie pot saline wash or you can try 3% hypertonic nasal saline spray  For acid reflux  - Continue Zegerid   -  continue to avoid fish oil  Potential asthma  - We were never sure that you have this in the first place  - Stop Qvar because you did not think he was helping you  Irritable larynx or cyclical cough  - Complete speech therapy course  - You can try to increase your gabapentin to 300 mg 4 times a day and see if this helps her cough but it makes her too drowsy back off to 300 mg 3 times a day  Followup  - 6 months - At time of followup he should have a followup CT scan of the chest for you 5 mm right lung nodule that was seen in November 2013;  CMA will ensure that there is an order for this

## 2013-01-18 ENCOUNTER — Ambulatory Visit: Payer: Medicare Other

## 2013-01-18 DIAGNOSIS — IMO0001 Reserved for inherently not codable concepts without codable children: Secondary | ICD-10-CM | POA: Diagnosis not present

## 2013-01-18 DIAGNOSIS — R498 Other voice and resonance disorders: Secondary | ICD-10-CM | POA: Diagnosis not present

## 2013-01-18 NOTE — Assessment & Plan Note (Signed)
Glad you're feeling better in terms of her cough For sinus  - Continue nasal steroid and please to try to start Nettie pot saline wash or you can try 3% hypertonic nasal saline spray  For acid reflux  - Continue Zegerid   -  continue to avoid fish oil  Potential asthma  - We were never sure that you have this in the first place  - Stop Qvar because you did not think he was helping you  Irritable larynx or cyclical cough  - Complete speech therapy course  - You can try to increase your gabapentin to 300 mg 4 times a day and see if this helps her cough but it makes her too drowsy back off to 300 mg 3 times a day  Followup  - 6 months - At time of followup he should have a followup CT scan of the chest for you 5 mm right lung nodule that was seen in November 2013;  CMA will ensure that there is an order for this  (> 50% of this 15 min visit spent in face to face counseling)

## 2013-01-20 ENCOUNTER — Ambulatory Visit: Payer: Medicare Other

## 2013-01-20 DIAGNOSIS — IMO0001 Reserved for inherently not codable concepts without codable children: Secondary | ICD-10-CM | POA: Diagnosis not present

## 2013-01-20 DIAGNOSIS — R498 Other voice and resonance disorders: Secondary | ICD-10-CM | POA: Diagnosis not present

## 2013-01-24 ENCOUNTER — Telehealth: Payer: Self-pay | Admitting: Internal Medicine

## 2013-01-24 DIAGNOSIS — R05 Cough: Secondary | ICD-10-CM

## 2013-01-24 DIAGNOSIS — J387 Other diseases of larynx: Secondary | ICD-10-CM

## 2013-01-24 MED ORDER — GABAPENTIN 300 MG PO CAPS: 300.0000 mg | ORAL_CAPSULE | Freq: Three times a day (TID) | ORAL | Status: DC

## 2013-01-24 MED ORDER — FLUTICASONE PROPIONATE 50 MCG/ACT NA SUSP: 2.0000 | Freq: Every day | NASAL | Status: DC

## 2013-01-24 NOTE — Telephone Encounter (Signed)
Flonase sent LMOM TCB x1 to discuss with patient about her gabapentin TID rather than QID as stated by MR below.

## 2013-01-24 NOTE — Telephone Encounter (Signed)
Per 2.18.14 ov w/ MR: Patient Instructions    Glad you're feeling better in terms of her cough  For sinus  - Continue nasal steroid and please to try to start Nettie pot saline wash or you can try 3% hypertonic nasal saline spray  For acid reflux  - Continue Zegerid  - continue to avoid fish oil  Potential asthma  - We were never sure that you have this in the first place  - Stop Qvar because you did not think he was helping you  Irritable larynx or cyclical cough  - Complete speech therapy course  - You can try to increase your gabapentin to 300 mg 4 times a day and see if this helps her cough but it makes her too drowsy back off to 300 mg 3 times a day  Followup  - 6 months  - At time of followup he should have a followup CT scan of the chest for you 5 mm right lung nodule that was seen in November 2013; CMA will ensure that there is an order for this   ======================================= Called spoke with patient She is requesting a refill on her flonase, 2 refills were last given (she requests we call pharmacy to make sure these were received) Stated that she tried the gabapentin QID x1 day after ov No change in her cough that day The day after, she woke up with facial edema up to her nose Reports was "pretty noticeable" Receded by the next day Has not tried taking it QID since, but would like to do so MR, are you okay with refilling her gabapentin for QID? Thanks

## 2013-01-24 NOTE — Telephone Encounter (Signed)
Send the flonase  Regarding gabapentin: just do tid (she told me she is sensitive to meds)  Dr. Kalman Shan, M.D., [facility].C.P Pulmonary and Critical Care Medicine Staff Physician [facility] [place] [facility] Pager: [phone], If no answer or between  15:00h - 7:00h: call [phone]  01/24/2013 12:38 PM

## 2013-01-24 NOTE — Telephone Encounter (Signed)
Spoke with pt and notified of recs per MR re gabapentin She verbalized understanding and denied any questions  Rx was sent to pharm

## 2013-02-10 ENCOUNTER — Ambulatory Visit: Payer: Medicare Other

## 2013-03-07 ENCOUNTER — Ambulatory Visit (INDEPENDENT_AMBULATORY_CARE_PROVIDER_SITE_OTHER): Payer: Medicare Other | Admitting: Family Medicine

## 2013-03-07 ENCOUNTER — Encounter: Payer: Self-pay | Admitting: Family Medicine

## 2013-03-07 VITALS — BP 110/70 | HR 93 | Temp 97.9°F | Ht 66.5 in | Wt 90.5 lb

## 2013-03-07 DIAGNOSIS — N390 Urinary tract infection, site not specified: Secondary | ICD-10-CM | POA: Insufficient documentation

## 2013-03-07 DIAGNOSIS — R300 Dysuria: Secondary | ICD-10-CM

## 2013-03-07 LAB — POCT URINALYSIS DIPSTICK
Bilirubin, UA: NEGATIVE
Glucose, UA: NEGATIVE
Nitrite, UA: NEGATIVE

## 2013-03-07 MED ORDER — SULFAMETHOXAZOLE-TMP DS 800-160 MG PO TABS: 1.0000 | ORAL_TABLET | Freq: Two times a day (BID) | ORAL | Status: DC

## 2013-03-07 NOTE — Assessment & Plan Note (Addendum)
Treat with sulfa/tmp x 3 days. Push fluids. Call if not improving as expected.

## 2013-03-07 NOTE — Patient Instructions (Addendum)
Treat with antibiotics x 3 days. Push fluids. Call if not improving as expected.

## 2013-03-07 NOTE — Progress Notes (Signed)
  Subjective:    Patient ID: Haley Kim, female    DOB: 11-27-1946, 67 y.o.   MRN: [id number]  Urinary Tract Infection  This is a new problem. The current episode started 1 to 4 weeks ago (improved some initially with cranberry). The problem has been gradually worsening. The quality of the pain is described as burning. The pain is moderate. There has been no fever. She is not sexually active. There is no history of pyelonephritis. Associated symptoms include frequency and urgency. Pertinent negatives include no flank pain, hematuria, nausea or vomiting. Associated symptoms comments: Low back pain, no abdominal pain or pressure.. She has tried increased fluids for the symptoms. The treatment provided no relief. There is no history of catheterization, kidney stones, recurrent UTIs, a single kidney, urinary stasis or a urological procedure.      Review of Systems  Gastrointestinal: Negative for nausea and vomiting.  Genitourinary: Positive for urgency and frequency. Negative for hematuria and flank pain.       Objective:   Physical Exam  Constitutional:  cachectic appearing female in NAD  HENT:  Head: Normocephalic.  Neck: Normal range of motion. Neck supple.  Cardiovascular: Normal rate and regular rhythm.  Exam reveals no friction rub.   No murmur heard. Pulmonary/Chest: Effort normal and breath sounds normal. No respiratory distress. She has no wheezes. She has no rales. She exhibits no tenderness.  Abdominal: Soft. Bowel sounds are normal. She exhibits no distension. There is no tenderness.  Musculoskeletal:       Lumbar back: She exhibits tenderness. She exhibits normal range of motion, no bony tenderness and no deformity.          Assessment & Plan:

## 2013-03-13 ENCOUNTER — Ambulatory Visit (INDEPENDENT_AMBULATORY_CARE_PROVIDER_SITE_OTHER): Payer: Medicare Other | Admitting: Family Medicine

## 2013-03-13 ENCOUNTER — Encounter: Payer: Self-pay | Admitting: Family Medicine

## 2013-03-13 VITALS — BP 108/64 | HR 96 | Temp 97.8°F | Wt 88.0 lb

## 2013-03-13 DIAGNOSIS — R300 Dysuria: Secondary | ICD-10-CM

## 2013-03-13 DIAGNOSIS — N760 Acute vaginitis: Secondary | ICD-10-CM | POA: Diagnosis not present

## 2013-03-13 LAB — POCT URINALYSIS DIPSTICK
Glucose, UA: NEGATIVE
Nitrite, UA: NEGATIVE
Urobilinogen, UA: NEGATIVE

## 2013-03-13 MED ORDER — CIPROFLOXACIN HCL 500 MG PO TABS: 500.0000 mg | ORAL_TABLET | Freq: Two times a day (BID) | ORAL | Status: AC

## 2013-03-13 NOTE — Patient Instructions (Addendum)
Please take cipro as directed- 1 tablet twice daily x 1 week. Take diflucan 150 mg once.  We will call you in two days with your urine culture results.

## 2013-03-13 NOTE — Progress Notes (Signed)
Subjective:    Patient ID: Haley Kim, female    DOB: [birth year], 67 y.o.   MRN: [id number]  HPI Here for ? UTI.  Saw Dr. Ermalene Searing last week- UA pos for moderate blood, small LE. Treated with 3 day course of Bactrim.  Completed course but feels symptoms are worsening. No urine cx sent.   She does now have some nausea, no vomiting.  Back also hurting.  No fever Has had some sweats No chills  Having some labial itching too.    Current Outpatient Prescriptions on File Prior to Visit  Medication Sig Dispense Refill  . beclomethasone (QVAR) 80 MCG/ACT inhaler Inhale 2 puffs into the lungs 2 (two) times daily.  1 Inhaler  6  . Calcium Carbonate (CALCIUM 600 PO) Take 1 tablet by mouth daily.      . Cholecalciferol (VITAMIN D3) 2000 UNITS TABS Take by mouth as directed.      . Coenzyme Q10 50 MG CAPS Take 120 mg by mouth daily.       . fluticasone (FLONASE) 50 MCG/ACT nasal spray Place 2 sprays into the nose daily.  16 g  3  . gabapentin (NEURONTIN) 300 MG capsule Take 1 capsule (300 mg total) by mouth 3 (three) times daily.  90 capsule  3  . MAGNESIUM PO Take 500 mg by mouth as directed.       . Multiple Vitamin (MULTIVITAMIN) tablet Take 1 tablet by mouth daily.        . Probiotic Product (PROBIOTIC PO) Take by mouth daily.        Marland Kitchen sulfamethoxazole-trimethoprim (BACTRIM DS) 800-160 MG per tablet Take 1 tablet by mouth 2 (two) times daily.  6 tablet  0  . vitamin C (ASCORBIC ACID) 500 MG tablet Take 500 mg by mouth daily.      . vitamin E 400 UNIT capsule Take 400 Units by mouth daily.         No current facility-administered medications on file prior to visit.    No Known Allergies  Past Medical History  Diagnosis Date  . NSVD (normal spontaneous vaginal delivery)     x 1  . Wrist fracture 01/2004    fell on tennis court, muscle problem caused  fall  . Pure hypercholesterolemia   . Disorders of bilirubin excretion   . Loss of weight   . 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 Allergy   . COPD (chronic obstructive pulmonary disease)   . Chronic headache     Past Surgical History  Procedure Laterality Date  . Breast lumpectomy      bilateral, age 46  . Tubal ligation      Family History  Problem Relation Age of Onset  . Alzheimer's disease Mother   . Alzheimer's disease Father   . Stroke Father     x 5  . Heart disease Father   . Cancer Paternal Aunt     throat  . Cancer Paternal Grandmother     throat  . Diabetes Neg Hx   . Alcohol abuse Neg Hx   . Colon cancer Neg Hx     History   Social History  . Marital Status: Married    Spouse Name: N/A    Number of Children: 1  . Years of Education: N/A   Occupational History  . Housewife    Social History Main Topics  . Smoking status: Never Smoker   . Smokeless tobacco: Never Used  .  Alcohol Use: No  . Drug Use: No  . Sexually Active: No   Other Topics Concern  . Not on file   Social History Narrative  . No narrative on file   Review of Systems No vaginal discharge     Objective:   Physical Exam  BP 108/64  Pulse 96  Temp(Src) 97.8 F (36.6 C)  Wt 88 lb (39.917 kg)  BMI 13.99 kg/m2  Constitutional: Thin pleasant female, NAD Abdominal: Soft. There is tenderness.       Mild suprapubic tenderness  Musculoskeletal:       No CVA tenderness       Assessment & Plan:  1. Dysuria UA pos- blood and LE. Treat with cipro to cover pyelo as well. Send urine for cx. The patient indicates understanding of these issues and agrees with the plan.  - POCT urinalysis dipstick - Urine culture  2. Vaginitis and vulvovaginitis Diflucan po x 1. Likely due to abx.

## 2013-03-14 ENCOUNTER — Encounter: Payer: Self-pay | Admitting: Family Medicine

## 2013-03-14 ENCOUNTER — Telehealth: Payer: Self-pay

## 2013-03-14 MED ORDER — FLUCONAZOLE 150 MG PO TABS: 150.0000 mg | ORAL_TABLET | Freq: Once | ORAL | Status: DC

## 2013-03-14 NOTE — Addendum Note (Signed)
Addended by: Dianne Dun on: 03/14/2013 09:38 AM   Modules accepted: Orders

## 2013-03-14 NOTE — Telephone Encounter (Signed)
Pt request status of diflucan being sent to [facility]. Spoke with pt and she has already picked up med at pharmacy.

## 2013-03-15 ENCOUNTER — Encounter: Payer: Self-pay | Admitting: Family Medicine

## 2013-03-15 LAB — URINE CULTURE
Colony Count: NO GROWTH
Organism ID, Bacteria: NO GROWTH

## 2013-03-16 ENCOUNTER — Other Ambulatory Visit: Payer: Self-pay | Admitting: Family Medicine

## 2013-03-16 ENCOUNTER — Ambulatory Visit (INDEPENDENT_AMBULATORY_CARE_PROVIDER_SITE_OTHER): Payer: Medicare Other | Admitting: Family Medicine

## 2013-03-16 DIAGNOSIS — R319 Hematuria, unspecified: Secondary | ICD-10-CM | POA: Diagnosis not present

## 2013-03-16 DIAGNOSIS — R1030 Lower abdominal pain, unspecified: Secondary | ICD-10-CM

## 2013-03-16 LAB — POCT URINALYSIS DIPSTICK
Bilirubin, UA: NEGATIVE
Nitrite, UA: NEGATIVE
pH, UA: 6

## 2013-03-17 ENCOUNTER — Telehealth: Payer: Self-pay | Admitting: Family Medicine

## 2013-03-17 ENCOUNTER — Emergency Department: Payer: Self-pay | Admitting: Emergency Medicine

## 2013-03-17 ENCOUNTER — Ambulatory Visit: Payer: Self-pay | Admitting: Family Medicine

## 2013-03-17 DIAGNOSIS — M542 Cervicalgia: Secondary | ICD-10-CM | POA: Diagnosis not present

## 2013-03-17 DIAGNOSIS — Z79899 Other long term (current) drug therapy: Secondary | ICD-10-CM | POA: Diagnosis not present

## 2013-03-17 DIAGNOSIS — R319 Hematuria, unspecified: Secondary | ICD-10-CM | POA: Diagnosis not present

## 2013-03-17 DIAGNOSIS — M549 Dorsalgia, unspecified: Secondary | ICD-10-CM | POA: Diagnosis not present

## 2013-03-17 DIAGNOSIS — R109 Unspecified abdominal pain: Secondary | ICD-10-CM | POA: Diagnosis not present

## 2013-03-17 DIAGNOSIS — R1084 Generalized abdominal pain: Secondary | ICD-10-CM | POA: Diagnosis not present

## 2013-03-17 LAB — COMPREHENSIVE METABOLIC PANEL
Albumin: 4 g/dL (ref 3.4–5.0)
Alkaline Phosphatase: 43 U/L — ABNORMAL LOW (ref 50–136)
Anion Gap: 4 — ABNORMAL LOW (ref 7–16)
BUN: 13 mg/dL (ref 7–18)
Bilirubin,Total: 0.5 mg/dL (ref 0.2–1.0)
Calcium, Total: 9.3 mg/dL (ref 8.5–10.1)
Chloride: 103 mmol/L (ref 98–107)
Co2: 31 mmol/L (ref 21–32)
EGFR (African American): >60
Glucose: 126 mg/dL — ABNORMAL HIGH (ref 65–99)
Osmolality: 277 (ref 275–301)
SGPT (ALT): 24 U/L (ref 12–78)
Total Protein: 7.4 g/dL (ref 6.4–8.2)

## 2013-03-17 LAB — CBC
HCT: 39.3 % (ref 35.0–47.0)
HGB: 13.4 g/dL (ref 12.0–16.0)
MCH: 34.1 pg — ABNORMAL HIGH (ref 26.0–34.0)
MCHC: 34 g/dL (ref 32.0–36.0)
MCV: 100 fL (ref 80–100)
RDW: 13.9 % (ref 11.5–14.5)
WBC: 5.8 10*3/uL (ref 3.6–11.0)

## 2013-03-17 LAB — URINALYSIS, COMPLETE
Glucose,UR: NEGATIVE mg/dL (ref 0–75)
Nitrite: NEGATIVE
Squamous Epithelial: NONE SEEN
WBC UR: 3 /HPF (ref 0–5)

## 2013-03-17 LAB — LIPASE, BLOOD: Lipase: 232 U/L (ref 73–393)

## 2013-03-17 NOTE — Progress Notes (Signed)
Hematuria. Presented for follow up UA, nurse visit.

## 2013-03-17 NOTE — Telephone Encounter (Signed)
Patient Information:  Caller Name: Chrissie Noa  Phone: [phone]  Patient: Haley Kim, Haley Kim  Gender: Female  DOB: 09/07/1946  Age: 67 Years  PCP: Ruthe Mannan [facility])  Office Follow Up:  Does the office need to follow up with this patient?: No  Instructions For The Office: N/A  RN Note:  Rosary has been experiencing severe back and side pain.  States she had a CT scan this morning to determine if she has a kidney stone.  Pain worsened after the test and became very severe.  Spoke with a nurse earlier and was advised to go to ED.  Currently at [facility] ED to be seen.  ED told him they will not be able to access the CT scan results until Dr. Dayton Martes signs off on them.  Called office and spoke with [facility].  Herbert Seta states the results are there and another MD will sign.  Symptoms  Reason For Call & Symptoms: CT results  Reviewed Health History In EMR: N/A  Reviewed Medications In EMR: N/A  Reviewed Allergies In EMR: N/A  Reviewed Surgeries / Procedures: N/A  Date of Onset of Symptoms: Unknown  Guideline(s) Used:  No Protocol Available - Sick Adult  Disposition Per Guideline:   Discuss with PCP and Callback by Nurse within 1 Hour  Reason For Disposition Reached:   Nursing judgment  Advice Given:  N/A  Patient Will Follow Care Advice:  YES

## 2013-03-17 NOTE — Telephone Encounter (Signed)
Caller: Haley Kim/Spouse; Phone: [phone]; Reason for Call: Haley Kim calling back because [facility] ER Triage nurse told him if the MD in the office has the CT report, then they could treat her and she would not need to be seen in the ED.  Called office and spoke with [facility].  Heather states Dr.  Ermalene Searing will call Haley Kim and his wife shortly.  Informed Haley Kim.

## 2013-03-20 ENCOUNTER — Telehealth: Payer: Self-pay

## 2013-03-20 NOTE — Telephone Encounter (Signed)
Record Num: [id number] Operator: Roselyn Meier Patient Name: Haley Kim Call Date & Time: 03/17/2013 11:52:50AM Patient Phone: [phone] PCP: Ruthe Mannan Patient Gender: Female PCP Fax : [phone] Patient DOB: [birth year] Practice Name: [facility] Reason for Call: Caller: Bill/Spouse; PCP: Ruthe Mannan (Family Practice); CB#: [phone]; Call regarding Pain; Onset 03/10/13 with back pain. Pt was seen in the office for symptoms and pt had CT scan ordered and done this am. Caller has not heard back the results and pt is in severe pain. Pt has taken an Excedrin for the pain but it has not helped. Triaged patient per Flank Pain Protocol. See ED Immediately Disposition for 'Unbearable abdominal pain'. Caller will take patient to [facility] where the CT was performed. Protocol(s) Used: Back Symptoms Protocol(s) Used: Flank Pain Recommended Outcome per Protocol: See ED Immediately Reason for Outcome: Pain predominately in flank area Unbearable abdominal/pelvic pain Care Advice: ~

## 2013-03-20 NOTE — Telephone Encounter (Signed)
Spoke with patient's husband, he said he will have patient call us back.  He is currently out of town.

## 2013-03-20 NOTE — Telephone Encounter (Signed)
Spoke with [employer].  This has been addressed last week.  Please call to check on pt.

## 2013-03-21 MED ORDER — MIRTAZAPINE 15 MG PO TBDP: 15.0000 mg | ORAL_TABLET | Freq: Every day | ORAL | Status: DC

## 2013-03-21 NOTE — Telephone Encounter (Signed)
Agreed.  Rx sent.

## 2013-03-21 NOTE — Telephone Encounter (Signed)
Advised patient.  She's willing to try remeron.  Uses [facility].  She just doesn't have any appetite, is trying to drink 2 ensures a day. Advised her to call back if the pain doesn't improve.

## 2013-03-21 NOTE — Telephone Encounter (Signed)
Spoke with patient.  She says she's doing better. Still having a lot of pain, severe at times.  Taking tylenol.   She says she didn't pick up appetite stimulant from pharmacy because it was too expensive.  She's asking if there is something else that she can try.  She is drinking ensure.

## 2013-03-21 NOTE — Telephone Encounter (Signed)
I'm glad she is feeling a little better.  Please keep [place] posted.  I reviewed her chart and we tried remeron in past.  This is not expensive, we could try it again.

## 2013-03-21 NOTE — Telephone Encounter (Signed)
Left message asking patient to call back

## 2013-03-22 ENCOUNTER — Telehealth: Payer: Self-pay | Admitting: Family Medicine

## 2013-03-22 NOTE — Telephone Encounter (Signed)
Patient Information:  Caller Name: Chrissie Noa  Phone: [phone]  Patient: Haley Kim, Haley Kim  Gender: Female  DOB: 02/07/1946  Age: 67 Years  PCP: Ruthe Mannan [facility])  Office Follow Up:  Does the office need to follow up with this patient?: No  Instructions For The Office: N/A  RN Note:  Seen in ED 03/17/13; told to see PCP within 1 week. Severe abdominal pain 03/22/13 when awoke at 0700; was unable to void.  Previous void was 03/21/13 afternoon.  Abdominal pain at 0700 rated 9/10. Drank 30 oz Gatorade; was then able to void and have a BM and pain decreased to 4/10 around 1000. Poor appetite.  Drinking fluids now.  Declined triage. Reported if continues to drink water and Gatorade, she  will continue to be able to urinate.   Requesting post ED visit.  Transferred to office scheduler/Carrie.  Symptoms  Reason For Call & Symptoms: Constant periumbilical abdominal pain that intermittently becomes severe.  Pain was severe this morning at 7-8 AM when unable to void; voided 2 hour later after drinking  30 oz of Gatorade.  Prior void was 03/21/13 afternoon.  Reviewed Health History In EMR: Yes  Reviewed Medications In EMR: Yes  Reviewed Allergies In EMR: Yes  Reviewed Surgeries / Procedures: N/A  Date of Onset of Symptoms: 03/15/2013  Treatments Tried: Gatorade to hydrate  Treatments Tried Worked: Yes  Guideline(s) Used:  No Protocol Available - Sick Adult  Disposition Per Guideline:   See Today or Tomorrow in Office  Reason For Disposition Reached:   Nursing judgment  Advice Given:  N/A  RN Overrode Recommendation:  Patient Already Has Appt, Document Patient  Office scheduled appointment for ED follow up.

## 2013-03-23 ENCOUNTER — Encounter: Payer: Self-pay | Admitting: Family Medicine

## 2013-03-23 ENCOUNTER — Ambulatory Visit (INDEPENDENT_AMBULATORY_CARE_PROVIDER_SITE_OTHER): Payer: Medicare Other | Admitting: Family Medicine

## 2013-03-23 VITALS — BP 140/80 | HR 76 | Temp 97.3°F | Wt 88.0 lb

## 2013-03-23 DIAGNOSIS — N200 Calculus of kidney: Secondary | ICD-10-CM

## 2013-03-23 DIAGNOSIS — R300 Dysuria: Secondary | ICD-10-CM

## 2013-03-23 DIAGNOSIS — R109 Unspecified abdominal pain: Secondary | ICD-10-CM | POA: Diagnosis not present

## 2013-03-23 MED ORDER — OXYCODONE-ACETAMINOPHEN 5-325 MG PO TABS: 1.0000 | ORAL_TABLET | Freq: Four times a day (QID) | ORAL | Status: DC | PRN

## 2013-03-23 MED ORDER — TAMSULOSIN HCL 0.4 MG PO CAPS: ORAL_CAPSULE | ORAL | Status: DC

## 2013-03-23 NOTE — Patient Instructions (Signed)
Good to see you. See Shirlee Limerick about your referral before you leave today. We are also starting Flomax.

## 2013-03-23 NOTE — Progress Notes (Signed)
Subjective:    Patient ID: Haley Kim, female    DOB: [birth year], 67 y.o.   MRN: [id number]  HPI 67 yo here for ER follow up.  [facility] notes reviewed.  I saw her on 03/13/2013 for back pain and nausea.  She had been treated the week before for UTI with 3 day course of Bactrim.    UA positive for blood and LE on 4/14, so I treated her for presumed pyelo with cipro.  Urine cx was neg so I ordered a CT of her abdomen and pelvis to rule out nephrolithiasis. Showed lower pole non obstructing left renal calculus, 6.2 x 4 mm.  No hydronephrosis.  Could not rule out on obstructing distal stone. Also had fluid collection in right pelvis concerning for peritonitis.  She went to ER due to severe pain.  CBC, CMET, lipase unremarkable. UA po for blood, trace LE. CT of abdomen and pelvis repeated on 03/17/2013 in ER- no significant changes. Left renal calculus remained non obstructing, previous fluid seen within pelvis remained as well.  Pain is now more intermittent but she still feels it.  Left back, radiates to left groin.  When she is having the pain, it is quite severe. Feels it typically when she tries to urinate.    Current Outpatient Prescriptions on File Prior to Visit  Medication Sig Dispense Refill  . Calcium Carbonate (CALCIUM 600 PO) Take 1 tablet by mouth daily.      . Cholecalciferol (VITAMIN D3) 2000 UNITS TABS Take by mouth as directed.      . Coenzyme Q10 50 MG CAPS Take 120 mg by mouth daily.       . fluconazole (DIFLUCAN) 150 MG tablet Take 1 tablet (150 mg total) by mouth once.  1 tablet  1  . fluticasone (FLONASE) 50 MCG/ACT nasal spray Place 2 sprays into the nose daily.  16 g  3  . MAGNESIUM PO Take 500 mg by mouth as directed.       . mirtazapine (REMERON SOL-TAB) 15 MG disintegrating tablet Take 1 tablet (15 mg total) by mouth at bedtime.  30 tablet  1  . Multiple Vitamin (MULTIVITAMIN) tablet Take 1 tablet by mouth daily.        . Probiotic Product (PROBIOTIC PO) Take  by mouth daily.        . vitamin C (ASCORBIC ACID) 500 MG tablet Take 500 mg by mouth daily.      . vitamin E 400 UNIT capsule Take 400 Units by mouth daily.         No current facility-administered medications on file prior to visit.    No Known Allergies  Past Medical History  Diagnosis Date  . NSVD (normal spontaneous vaginal delivery)     x 1  . Wrist fracture 01/2004    fell on tennis court, muscle problem caused  fall  . Pure hypercholesterolemia   . Disorders of bilirubin excretion   . Loss of weight   . 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 Allergy   . COPD (chronic obstructive pulmonary disease)   . Chronic headache     Past Surgical History  Procedure Laterality Date  . Breast lumpectomy      bilateral, age 42  . Tubal ligation      Family History  Problem Relation Age of Onset  . Alzheimer's disease Mother   . Alzheimer's disease Father   . Stroke Father     x 5  .  Heart disease Father   . Cancer Paternal Aunt     throat  . Cancer Paternal Grandmother     throat  . Diabetes Neg Hx   . Alcohol abuse Neg Hx   . Colon cancer Neg Hx     History   Social History  . Marital Status: Married    Spouse Name: N/A    Number of Children: 1  . Years of Education: N/A   Occupational History  . Housewife    Social History Main Topics  . Smoking status: Never Smoker   . Smokeless tobacco: Never Used  . Alcohol Use: No  . Drug Use: No  . Sexually Active: No   Other Topics Concern  . Not on file   Social History Narrative  . No narrative on file   Review of Systems No vaginal discharge No fevers    Objective:   Physical Exam  BP 140/80  Pulse 76  Temp(Src) 97.3 F (36.3 C)  Wt 88 lb (39.917 kg)  BMI 13.99 kg/m2  Constitutional: Thin pleasant female, NAD Abdominal: Soft. There is tenderness.       Mild suprapubic tenderness  Musculoskeletal:       No CVA tenderness       Assessment & Plan:   1. Abdominal   pain, other specified site Due to #2.  See below. 2. Nephrolithiasis New, non obstructing. Given that it is larger than 6 mm, will refer to urology. Start flomax, percocet rx given for severe pain. Advised increased fluids. The patient indicates understanding of these issues and agrees with the plan.  - Ambulatory referral to Urology

## 2013-04-11 ENCOUNTER — Ambulatory Visit (INDEPENDENT_AMBULATORY_CARE_PROVIDER_SITE_OTHER)
Admission: RE | Admit: 2013-04-11 | Discharge: 2013-04-11 | Disposition: A | Discharge status: Home / Self Care | Payer: Medicare Other | Source: Ambulatory Visit | Attending: Internal Medicine | Admitting: Internal Medicine

## 2013-04-11 DIAGNOSIS — R911 Solitary pulmonary nodule: Secondary | ICD-10-CM | POA: Diagnosis not present

## 2013-04-11 DIAGNOSIS — J479 Bronchiectasis, uncomplicated: Secondary | ICD-10-CM | POA: Diagnosis not present

## 2013-04-17 ENCOUNTER — Telehealth: Payer: Self-pay | Admitting: Internal Medicine

## 2013-04-17 NOTE — Telephone Encounter (Signed)
Pt aware of results. Jennifer Castillo, CMA  

## 2013-06-26 ENCOUNTER — Telehealth: Payer: Self-pay

## 2013-06-26 NOTE — Telephone Encounter (Signed)
Advised patients sister as instructed.

## 2013-06-26 NOTE — Telephone Encounter (Signed)
Unfortunately, I cannot sign this letter since she is not her POA and not on her designated party release.  Both Dr. Hetty Ely and I have been trying to work her up and treat her for weight loss.  I would be happy to refer her for mental health evaluation and treatment but she has refused this.

## 2013-06-26 NOTE — Telephone Encounter (Signed)
Donnamarie Poag, pts sister in law brought a letter for Dr Dayton Martes requesting a mental health referral; Mrs Clinton Sawyer said pt is aware of who she is, her address etc but since pt refuses to eat, not taking medication and lays in bed 15-18 hours a day Mrs Clinton Sawyer is in process of petitioning for a competency hearing for pt to seek mental health evaluation. Copy of letter is in Dr Elmer Sow in box. Mrs Clinton Sawyer request cb. Do not see where Mrs Clinton Sawyer is on designated party release form and Mrs Clinton Sawyer said she is not pt's POA.

## 2013-08-18 ENCOUNTER — Other Ambulatory Visit: Payer: Self-pay

## 2013-08-18 MED ORDER — MIRTAZAPINE 15 MG PO TBDP: 15.0000 mg | ORAL_TABLET | Freq: Every day | ORAL | Status: DC

## 2013-08-18 NOTE — Telephone Encounter (Signed)
Pt request refill mirtazapine to [facility] [address]. Pt said she takes med to increase her appetite.Please advise.

## 2013-10-05 ENCOUNTER — Other Ambulatory Visit: Payer: Self-pay

## 2013-12-22 ENCOUNTER — Telehealth: Payer: Self-pay | Admitting: *Deleted

## 2013-12-22 NOTE — Telephone Encounter (Signed)
Lmom time to schedule carotid doppler (1 year ago)

## 2016-01-15 ENCOUNTER — Telehealth: Payer: Self-pay

## 2016-01-15 NOTE — Telephone Encounter (Signed)
Patient stated she wanted to think about it and call back. Will attempt to OR at a future date.

## 2016-01-17 ENCOUNTER — Other Ambulatory Visit: Payer: Medicare Other

## 2016-01-20 ENCOUNTER — Other Ambulatory Visit: Payer: Self-pay | Admitting: Family Medicine

## 2016-01-20 ENCOUNTER — Other Ambulatory Visit (INDEPENDENT_AMBULATORY_CARE_PROVIDER_SITE_OTHER): Payer: Medicare Other

## 2016-01-20 DIAGNOSIS — E7800 Pure hypercholesterolemia, unspecified: Secondary | ICD-10-CM

## 2016-01-20 DIAGNOSIS — Z0000 Encounter for general adult medical examination without abnormal findings: Secondary | ICD-10-CM | POA: Diagnosis not present

## 2016-01-20 LAB — LIPID PANEL
CHOL/HDL RATIO: 2
CHOLESTEROL: 231 mg/dL — AB (ref 0–200)
HDL: 95.4 mg/dL (ref >39.00)
LDL Cholesterol: 117 mg/dL — ABNORMAL HIGH (ref 0–99)
NonHDL: 135.11
TRIGLYCERIDES: 93 mg/dL (ref 0.0–149.0)
VLDL: 18.6 mg/dL (ref 0.0–40.0)

## 2016-01-20 LAB — COMPREHENSIVE METABOLIC PANEL
ALBUMIN: 4.3 g/dL (ref 3.5–5.2)
ALT: 14 U/L (ref 0–35)
AST: 20 U/L (ref 0–37)
Alkaline Phosphatase: 33 U/L — ABNORMAL LOW (ref 39–117)
BILIRUBIN TOTAL: 0.8 mg/dL (ref 0.2–1.2)
BUN: 22 mg/dL (ref 6–23)
CALCIUM: 10 mg/dL (ref 8.4–10.5)
CO2: 32 mEq/L (ref 19–32)
CREATININE: 0.88 mg/dL (ref 0.40–1.20)
Chloride: 102 mEq/L (ref 96–112)
GFR: 67.12 mL/min (ref >60.00)
Glucose, Bld: 94 mg/dL (ref 70–99)
Potassium: 4 mEq/L (ref 3.5–5.1)
Sodium: 141 mEq/L (ref 135–145)
Total Protein: 7.3 g/dL (ref 6.0–8.3)

## 2016-01-20 LAB — TSH: TSH: 2.32 u[IU]/mL (ref 0.35–4.50)

## 2016-01-20 LAB — CBC WITH DIFFERENTIAL/PLATELET
Basophils Absolute: 0 10*3/uL (ref 0.0–0.1)
Basophils Relative: 0.8 % (ref 0.0–3.0)
EOS ABS: 0.2 10*3/uL (ref 0.0–0.7)
EOS PCT: 4.7 % (ref 0.0–5.0)
HEMATOCRIT: 39.3 % (ref 36.0–46.0)
HEMOGLOBIN: 13 g/dL (ref 12.0–15.0)
LYMPHS PCT: 21.1 % (ref 12.0–46.0)
Lymphs Abs: 0.9 10*3/uL (ref 0.7–4.0)
MCHC: 33.1 g/dL (ref 30.0–36.0)
MCV: 97.8 fl (ref 78.0–100.0)
Monocytes Absolute: 0.4 10*3/uL (ref 0.1–1.0)
Monocytes Relative: 8.8 % (ref 3.0–12.0)
Neutro Abs: 2.9 10*3/uL (ref 1.4–7.7)
Neutrophils Relative %: 64.6 % (ref 43.0–77.0)
Platelets: 195 10*3/uL (ref 150.0–400.0)
RBC: 4.02 Mil/uL (ref 3.87–5.11)
RDW: 15.1 % (ref 11.5–15.5)
WBC: 4.4 10*3/uL (ref 4.0–10.5)

## 2016-01-22 ENCOUNTER — Ambulatory Visit (INDEPENDENT_AMBULATORY_CARE_PROVIDER_SITE_OTHER): Payer: Medicare Other | Admitting: Family Medicine

## 2016-01-22 ENCOUNTER — Encounter: Payer: Self-pay | Admitting: Family Medicine

## 2016-01-22 VITALS — BP 104/70 | HR 121 | Temp 97.9°F | Ht 66.25 in | Wt 77.5 lb

## 2016-01-22 DIAGNOSIS — Z0000 Encounter for general adult medical examination without abnormal findings: Secondary | ICD-10-CM | POA: Diagnosis not present

## 2016-01-22 DIAGNOSIS — M8580 Other specified disorders of bone density and structure, unspecified site: Secondary | ICD-10-CM | POA: Diagnosis not present

## 2016-01-22 DIAGNOSIS — Z23 Encounter for immunization: Secondary | ICD-10-CM | POA: Diagnosis not present

## 2016-01-22 DIAGNOSIS — F5000 Anorexia nervosa, unspecified: Secondary | ICD-10-CM

## 2016-01-22 DIAGNOSIS — R634 Abnormal weight loss: Secondary | ICD-10-CM | POA: Diagnosis not present

## 2016-01-22 DIAGNOSIS — J42 Unspecified chronic bronchitis: Secondary | ICD-10-CM | POA: Diagnosis not present

## 2016-01-22 MED ORDER — DRONABINOL 2.5 MG PO CAPS: 2.5000 mg | ORAL_CAPSULE | Freq: Two times a day (BID) | ORAL | Status: DC

## 2016-01-22 NOTE — Progress Notes (Signed)
Subjective:    Patient ID: Haley Kim, female    DOB: February 24, 1946, 70 y.o.   MRN: [id number]  HPI  70 yo female whom I have not seen in nearly 3 years,  here with her husband for medicare annual wellness visit.  I have personally reviewed the Medicare Annual Wellness questionnaire and have noted 1. The patient's medical and social history 2. Their use of alcohol, tobacco or illicit drugs 3. Their current medications and supplements 4. The patient's functional ability including ADL's, fall risks, home safety risks and hearing or visual             impairment. 5. Diet and physical activities 6. Evidence for depression or mood disorders  End of life wishes discussed and updated in Social History.  The roster of all physicians providing medical care to patient - is listed in the CareTeams section of the chart.  Overdue for a lot of prevention. DEXA 11/11/11 Colonoscopy 07/25/12 Pneumovax 01/30/10 Mammogram 12/22/12   Weight loss- has been a persistent issue - for years, even prior to establishing with me.  Dr. Council Mechanic was working on this with her over 6 years ago.   GI work up years ago was neg.  Referred to nutritionist who discharged pt bc she has an "unusual relationship with food," according to her husband.   She feels she is "does not have an eating disorder."  Often just not hungry. Was previously on remeron and megace- felt neither one worked. She denies that she has an eating disorder. Wt Readings from Last 3 Encounters:  01/22/16 77 lb 8 oz (35.154 kg)  03/23/13 88 lb (39.917 kg)  03/13/13 88 lb (39.917 kg)    Osteopenia- stopped fosamax years ago Due for repeat DEXA.  She is taking Calcium and Vit D.  Plays tennis.  No recent falls.   Lab Results  Component Value Date   CHOL 231* 01/20/2016   HDL 95.40 01/20/2016   LDLCALC 117* 01/20/2016   LDLDIRECT 133.9 11/14/2012   TRIG 93.0 01/20/2016   CHOLHDL 2 01/20/2016   Lab Results  Component Value Date    CREATININE 0.88 01/20/2016   Lab Results  Component Value Date   TSH 2.32 01/20/2016   Lab Results  Component Value Date   WBC 4.4 01/20/2016   HGB 13.0 01/20/2016   HCT 39.3 01/20/2016   MCV 97.8 01/20/2016   PLT 195.0 01/20/2016   Lab Results  Component Value Date   NA 141 01/20/2016   K 4.0 01/20/2016   CL 102 01/20/2016   CO2 32 01/20/2016    Patient Active Problem List   Diagnosis Date Noted  . Medicare annual wellness visit, subsequent 01/22/2016  . Pulmonary nodule, right middle lobe, 74mm, cavitary - Nov 2013 (done for chronic cough) 10/20/2012  . COPD (chronic obstructive pulmonary disease) ([place]) 08/16/2012  . Osteopenia 11/19/2011  . Carotid stenosis, bilateral 04/09/2011  . 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 HYPERCHOLESTEROLEMIA 01/18/2008  . GILBERT'S SYNDROME 01/18/2008  . WEIGHT LOSS 05/10/2007   Past Medical History  Diagnosis Date  . NSVD (normal spontaneous vaginal delivery)     x 1  . Wrist fracture 01/2004    fell on tennis court, muscle problem caused  fall  . Pure hypercholesterolemia   . Disorders of bilirubin excretion   . Loss of weight   . 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 Allergy   . COPD (chronic obstructive pulmonary disease) ([place])   .  Chronic headache    Past Surgical History  Procedure Laterality Date  . Breast lumpectomy      bilateral, age 39  . Tubal ligation     Social History  Substance Use Topics  . Smoking status: Never Smoker   . Smokeless tobacco: Never Used  . Alcohol Use: No   Family History  Problem Relation Age of Onset  . Alzheimer's disease Mother   . Alzheimer's disease Father   . Stroke Father     x 5  . Heart disease Father   . Cancer Paternal Aunt     throat  . Cancer Paternal Grandmother     throat  . Diabetes Neg Hx   . Alcohol abuse Neg Hx   . Colon cancer Neg Hx    No Known Allergies Current Outpatient Prescriptions on File Prior to Visit   Medication Sig Dispense Refill  . Calcium Carbonate (CALCIUM 600 PO) Take 1 tablet by mouth daily.    . Cholecalciferol (VITAMIN D3) 2000 UNITS TABS Take by mouth as directed.    . Coenzyme Q10 50 MG CAPS Take 120 mg by mouth daily.     Marland Kitchen MAGNESIUM PO Take 500 mg by mouth as directed.     . Multiple Vitamin (MULTIVITAMIN) tablet Take 1 tablet by mouth daily.      . Probiotic Product (PROBIOTIC PO) Take by mouth daily.      . vitamin C (ASCORBIC ACID) 500 MG tablet Take 500 mg by mouth daily.    . vitamin E 400 UNIT capsule Take 400 Units by mouth daily.       No current facility-administered medications on file prior to visit.   The PMH, PSH, Social History, Family History, Medications, and allergies have been reviewed in [facility], and have been updated if relevant.  Review of Systems  Constitutional: Positive for appetite change and unexpected weight change.  HENT: Negative.   Eyes: Negative.   Respiratory: Negative.   Cardiovascular: Negative.   Gastrointestinal: Negative.   Endocrine: Negative.   Genitourinary: Negative.   Musculoskeletal: Negative.   Skin: Negative.   Allergic/Immunologic: Negative.   Neurological: Negative.   Hematological: Negative.   Psychiatric/Behavioral: Negative for dysphoric mood.  All other systems reviewed and are negative.      Objective:   Physical Exam BP 104/70 mmHg  Pulse 121  Temp(Src) 97.9 F (36.6 C) (Oral)  Ht 5' 6.25" (1.683 m)  Wt 77 lb 8 oz (35.154 kg)  BMI 12.41 kg/m2  SpO2 97% Wt Readings from Last 3 Encounters:  01/22/16 77 lb 8 oz (35.154 kg)  03/23/13 88 lb (39.917 kg)  03/13/13 88 lb (39.917 kg)     General:  Cachectic elderly woman,in no acute distress; alert,appropriate and cooperative throughout examination Head:  normocephalic and atraumatic.   Eyes:  vision grossly intact, pupils equal, pupils round, and pupils reactive to light.   Ears:  R ear normal and L ear normal.   Nose:  no external deformity.   Mouth:   good dentition.   Neck:  No deformities, masses, or tenderness noted. Lungs:  Normal respiratory effort, chest expands symmetrically. Lungs are clear to auscultation, no crackles or wheezes. Heart:  Normal rate and regular rhythm. S1 and S2 normal without gallop, murmur, click, rub or other extra sounds. Abdomen:  Bowel sounds positive,abdomen soft and non-tender without masses, organomegaly or hernias noted. Msk:  No deformity or scoliosis noted of thoracic or lumbar spine.   Extremities:  No clubbing, cyanosis,  edema, or deformity noted with normal full range of motion of all joints.   Neurologic:  alert & oriented X3 and gait normal.   Skin:  Intact without suspicious lesions or rashes Cervical Nodes:  No lymphadenopathy noted Axillary Nodes:  No palpable lymphadenopathy Psych:  Cognition and judgment appear intact. Alert and cooperative with normal attention span and concentration. No apparent delusions, illusions, hallucinations      Assessment & Plan:

## 2016-01-22 NOTE — Addendum Note (Signed)
Addended by: Modena Nunnery on: 01/22/2016 01:42 PM   Modules accepted: Orders

## 2016-01-22 NOTE — Patient Instructions (Signed)
Great to see you.  Check with your insurance to see if they will cover the shingles shot.  

## 2016-01-22 NOTE — Assessment & Plan Note (Signed)
The patients weight, height, BMI and visual acuity have been recorded in the chart.  Cognitive function assessed.   I have made referrals, counseling and provided education to the patient based review of the above and I have provided the pt with a written personalized care plan for preventive services.  Prevnar 13 and influenza vaccines given today.  

## 2016-01-22 NOTE — Progress Notes (Signed)
Pre visit review using our clinic review tool, if applicable. No additional management support is needed unless otherwise documented below in the visit note. 

## 2016-01-22 NOTE — Addendum Note (Signed)
Addended by: Lucille Passy on: 01/22/2016 01:44 PM   Modules accepted: Orders, SmartSet

## 2016-01-22 NOTE — Assessment & Plan Note (Addendum)
Deteriorated. Total time spent with patient was 65 minutes, with 25 minutes spent specifically on problem visit concerning weight loss/eating disorder. Greater than 50 percent of the 25 minutes was spent in counseling on this. She does finally agree to psychotherapy.  Referral placed. She is willing to try marinol. Rx printed and given to pt. Follow up with me in 1 month.

## 2016-02-04 ENCOUNTER — Telehealth: Payer: Self-pay | Admitting: Family Medicine

## 2016-02-04 NOTE — Telephone Encounter (Signed)
Haley Kim, can you help with this?

## 2016-02-04 NOTE — Telephone Encounter (Signed)
Patient's husband called.  Patient was referred to Dr.Perrin.  Dr.Perrin's office called him and let him know that Dr.Perrin is not taking new patients.  Her office recommended other providers in [place].  Patient's husband would like to know who Dr.Aron would recommend.  They live in [place] and would like to see someone at the [facility] office or in [place].

## 2016-02-06 NOTE — Telephone Encounter (Signed)
Appt was made with Dr Kyra Leyland with [facility] for 02/11/16 and Patient is aware.

## 2016-02-11 ENCOUNTER — Ambulatory Visit (INDEPENDENT_AMBULATORY_CARE_PROVIDER_SITE_OTHER): Payer: Medicare Other | Admitting: Psychiatry

## 2016-02-11 DIAGNOSIS — F509 Eating disorder, unspecified: Secondary | ICD-10-CM

## 2016-02-11 DIAGNOSIS — F309 Manic episode, unspecified: Secondary | ICD-10-CM

## 2016-02-19 ENCOUNTER — Ambulatory Visit: Payer: Medicare Other | Admitting: Family Medicine

## 2016-03-02 ENCOUNTER — Other Ambulatory Visit: Payer: Self-pay | Admitting: Family Medicine

## 2016-03-03 NOTE — Telephone Encounter (Signed)
Rx faxed to requested pharmacy 

## 2016-03-03 NOTE — Telephone Encounter (Signed)
Last f/u 01/2016-CPE 

## 2016-03-09 ENCOUNTER — Ambulatory Visit: Payer: Medicare Other | Admitting: Family Medicine

## 2016-03-17 DIAGNOSIS — R4189 Other symptoms and signs involving cognitive functions and awareness: Secondary | ICD-10-CM | POA: Diagnosis not present

## 2016-03-17 DIAGNOSIS — R630 Anorexia: Secondary | ICD-10-CM | POA: Diagnosis not present

## 2016-03-17 DIAGNOSIS — R636 Underweight: Secondary | ICD-10-CM | POA: Diagnosis not present

## 2016-03-20 DIAGNOSIS — R4182 Altered mental status, unspecified: Secondary | ICD-10-CM | POA: Diagnosis not present

## 2016-04-01 ENCOUNTER — Encounter: Payer: Self-pay | Admitting: Neurology

## 2016-04-01 ENCOUNTER — Ambulatory Visit (INDEPENDENT_AMBULATORY_CARE_PROVIDER_SITE_OTHER): Payer: Medicare Other | Admitting: Neurology

## 2016-04-01 VITALS — BP 134/76 | HR 82 | Ht 66.25 in | Wt 79.2 lb

## 2016-04-01 DIAGNOSIS — R799 Abnormal finding of blood chemistry, unspecified: Secondary | ICD-10-CM | POA: Diagnosis not present

## 2016-04-01 DIAGNOSIS — R413 Other amnesia: Secondary | ICD-10-CM

## 2016-04-01 DIAGNOSIS — G3184 Mild cognitive impairment, so stated: Secondary | ICD-10-CM | POA: Insufficient documentation

## 2016-04-01 DIAGNOSIS — M8580 Other specified disorders of bone density and structure, unspecified site: Secondary | ICD-10-CM | POA: Diagnosis not present

## 2016-04-01 MED ORDER — MIRTAZAPINE 7.5 MG PO TABS: 7.5000 mg | ORAL_TABLET | Freq: Every day | ORAL | Status: DC

## 2016-04-01 NOTE — Progress Notes (Signed)
PATIENT: Haley Kim DOB: 09/14/1946  Chief Complaint  Patient presents with  . Memory Loss    MMSE 25/30 - 15 animals.  She is here with her husband, Haley Kim, to have her memory evaluated.  They would like to review her recent MRI brain (brought disc with them today).  She was started on donepezil 5mg  daily about six weeks ago.     HISTORICAL  Haley Kim is a 70 years old right-handed female, accompanied by her husband Haley Kim, seen in refer by her primary care physician Dr. Leamon Arnt for evaluation of memory loss in May third 2017.  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,  She was noted to have memory loss since 2014, concurrent with her memory loss, she also suffered significant weight loss, lost 40 pounds over past few years, she stayed home most of the time, she used to enjoy playing tennis, she has not play with her friend for 3 months, she stay in bed most of the time, lost appetite, no longer active in her household duty, watching TV when she is awake, she also complains of frequent headaches, bilateral frontal temporal region low-grade pressure pain  Laboratory evaluation in February 2017 showed normal CBC CMP TSH   We have personally reviewed MRI brain scan from [facility] in April 2017, mild generalized atrophy, supratentorium small vessel disease, no acute lesions. She was started on Aricept 5 mg a month ago, was able to tolerate the medications   REVIEW OF SYSTEMS: Full 14 system review of systems performed and notable only for weight loss, fatigue, cough, constipation, feeling cold, runny nose, memory loss, confusion, weakness, too much sleep, decreased energy, change in appetite  ALLERGIES: No Known Allergies  HOME MEDICATIONS: Current Outpatient Prescriptions  Medication Sig Dispense Refill  . Calcium Carbonate (CALCIUM 600 PO) Take 1 tablet by mouth daily.    .  Cholecalciferol (VITAMIN D3) 2000 UNITS TABS Take by mouth as directed.    . Coenzyme Q10 50 MG CAPS Take 120 mg by mouth daily.     Marland Kitchen donepezil (ARICEPT) 5 MG tablet Take by mouth.    . dronabinol (MARINOL) 2.5 MG capsule TAKE ONE CAPSULE BY MOUTH TWICE DAILY BEFORE  LUNCH  AND  SUPPER 60 capsule 0  . MAGNESIUM PO Take 400 mg by mouth as directed.     . Multiple Vitamin (MULTIVITAMIN) tablet Take 1 tablet by mouth daily.      . Probiotic Product (PROBIOTIC PO) Take by mouth daily.      . vitamin C (ASCORBIC ACID) 500 MG tablet Take 500 mg by mouth daily.    . vitamin E 400 UNIT capsule Take 400 Units by mouth daily.       No current facility-administered medications for this visit.    PAST MEDICAL HISTORY: Past Medical History  Diagnosis Date  . NSVD (normal spontaneous vaginal delivery)     x 1  . Wrist fracture 01/2004    fell on tennis court, muscle problem caused  fall  . Pure hypercholesterolemia   . Disorders of bilirubin excretion   . Loss of weight   . 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 Allergy   . COPD (chronic obstructive pulmonary disease) ([place])   . Chronic headache   . Anxiety   . Memory loss     PAST SURGICAL HISTORY: Past Surgical History  Procedure  Laterality Date  . Breast lumpectomy      bilateral, age 63  . Tubal ligation      FAMILY HISTORY: Family History  Problem Relation Age of Onset  . Alzheimer's disease Mother   . Alzheimer's disease Father   . Stroke Father     x 5  . Heart disease Father   . Cancer Paternal Aunt     throat  . Cancer Paternal Grandmother     throat  . Diabetes Neg Hx   . Alcohol abuse Neg Hx   . Colon cancer Neg Hx     SOCIAL HISTORY:  Social History   Social History  . Marital Status: Married    Spouse Name: N/A  . Number of Children: 1  . Years of Education: 2 yrs coll   Occupational History  . Housewife    Social History Main Topics  . Smoking status: Never Smoker   .  Smokeless tobacco: Never Used  . Alcohol Use: No  . Drug Use: No  . Sexual Activity: No   Other Topics Concern  . Not on file   Social History Narrative   Desires CPR.   Lives at home with her husband.   Right-handed.   No caffeine use.     PHYSICAL EXAM   Filed Vitals:   04/01/16 1352  BP: 134/76  Pulse: 82  Height: 5' 6.25" (1.683 m)  Weight: 79 lb 4 oz (35.948 kg)    Not recorded      Body mass index is 12.69 kg/(m^2).  PHYSICAL EXAMNIATION:  Gen: NAD, conversant, well nourised, obese, well groomed                     Cardiovascular: Regular rate rhythm, no peripheral edema, warm, nontender. Eyes: Conjunctivae clear without exudates or hemorrhage Neck: Supple, no carotid bruise. Pulmonary: Clear to auscultation bilaterally   NEUROLOGICAL EXAM:  MENTAL STATUS:Very faint elderly lady, Speech:    Speech is normal; fluent and spontaneous with normal comprehension.  Cognition: Mini-Mental Status Examination is 25/30, animal naming 15     Orientation to time, place and person: She missed the season     Recent and remote memory: She missed 3 out of 3 recall     Normal Attention span and concentration     Normal Language, naming, repeating,spontaneous speech she has difficulty copy design     Fund of knowledge,    CRANIAL NERVES: CN II: Visual fields are full to confrontation. Fundoscopic exam is normal with sharp discs and no vascular changes. Pupils are round equal and briskly reactive to light. CN III, IV, VI: extraocular movement are normal. No ptosis. CN V: Facial sensation is intact to pinprick in all 3 divisions bilaterally. Corneal responses are intact.  CN VII: Face is symmetric with normal eye closure and smile. CN VIII: Hearing is normal to rubbing fingers CN IX, X: Palate elevates symmetrically. Phonation is normal. CN XI: Head turning and shoulder shrug are intact CN XII: Tongue is midline with normal movements and no atrophy.  MOTOR: There is no  pronator drift of out-stretched arms. Muscle bulk and tone are normal. Muscle strength is normal.  REFLEXES: Reflexes are 2+ and symmetric at the biceps, triceps, knees, and ankles. Plantar responses are flexor.  SENSORY: Intact to light touch, pinprick, positional sensation and vibratory sensation are intact in fingers and toes.  COORDINATION: Rapid alternating movements and fine finger movements are intact. There is no dysmetria on finger-to-nose and  heel-knee-shin.    GAIT/STANCE: Posture is normal. Gait is steady with normal steps, base, arm swing, and turning. Heel and toe walking are normal. Tandem gait is normal.  Romberg is absent.   DIAGNOSTIC DATA (LABS, IMAGING, TESTING) - I reviewed patient records, labs, notes, testing and imaging myself where available.   ASSESSMENT AND PLAN  AASHA QUILLEN is a 70 y.o. 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 Ph.D.  [facility] [address] [place], [place] [address] Ph: [phone] Fax: [phone]  [id number] Reche Dixon, MD

## 2016-04-02 LAB — VITAMIN B12: Vitamin B-12: 1552 pg/mL — ABNORMAL HIGH (ref 211–946)

## 2016-04-02 LAB — VITAMIN D 25 HYDROXY (VIT D DEFICIENCY, FRACTURES): VIT D 25 HYDROXY: 57.2 ng/mL (ref 30.0–100.0)

## 2016-05-14 ENCOUNTER — Encounter: Payer: Self-pay | Admitting: Neurology

## 2016-05-14 ENCOUNTER — Ambulatory Visit (INDEPENDENT_AMBULATORY_CARE_PROVIDER_SITE_OTHER): Payer: Medicare Other | Admitting: Neurology

## 2016-05-14 ENCOUNTER — Ambulatory Visit: Payer: Medicare Other | Admitting: Neurology

## 2016-05-14 VITALS — BP 122/96 | HR 96 | Ht 66.25 in | Wt 79.5 lb

## 2016-05-14 DIAGNOSIS — F329 Major depressive disorder, single episode, unspecified: Secondary | ICD-10-CM

## 2016-05-14 DIAGNOSIS — F32A Depression, unspecified: Secondary | ICD-10-CM | POA: Insufficient documentation

## 2016-05-14 DIAGNOSIS — R911 Solitary pulmonary nodule: Secondary | ICD-10-CM

## 2016-05-14 MED ORDER — MIRTAZAPINE 7.5 MG PO TABS: 15.0000 mg | ORAL_TABLET | Freq: Every day | ORAL | Status: DC

## 2016-05-14 NOTE — Progress Notes (Signed)
PATIENT: Haley Kim DOB: Mar 11, 1946  Chief Complaint  Patient presents with  . Rm 5  . Follow-up    Pt returns for 1 mo f/u of memory. Here today w/ her husband. Reports that she has been doing well. Husband says that she has been having some hallucinations and bad dreams. Has some issues w/ diarrhea and constipation but otherwise tolerating Aricept. Sleeps about 12 hrs a night w/ Remeron. Feels that appetite has improved. Weight remains stable. MMSE: 26/30. Voices sadness d/t not being able to play tennis recently.     HISTORICAL  Haley Kim is a 70 years old right-handed female, accompanied by her husband Haley Kim, seen in refer by her primary care physician Dr. Leamon Arnt for evaluation of memory loss in May third 2017.  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,  She was noted to have memory loss since 2014, concurrent with her memory loss, she also suffered significant weight loss, lost 40 pounds over past few years, she stayed home most of the time, she used to enjoy playing tennis, she has not play with her friend for 3 months, she stay in bed most of the time, lost appetite, no longer active in her household duty, watching TV when she is awake, she also complains of frequent headaches, bilateral frontal temporal region low-grade pressure pain  Laboratory evaluation in February 2017 showed normal CBC CMP TSH   We have personally reviewed MRI brain scan from [facility] in April 2017, mild generalized atrophy, supratentorium small vessel disease, no acute lesions. She was started on Aricept 5 mg a month ago, was able to tolerate the medications  UPDATE June 15th 2017: She is able to tolerate Remeron 7.5 mg every night, which has helped her appetite, she can sleep better, is more motivated, but over the past one month, she was noted to have few episode of nighttime confusion, woke up  from sleep looking for her mother, who has passed away few years ago, she has no recollection of the event,  We reviewed laboratory evaluation, normal B12, vitamin D.  REVIEW OF SYSTEMS: Full 14 system review of systems performed and notable only for Memory loss, weakness  ALLERGIES: No Known Allergies  HOME MEDICATIONS: Current Outpatient Prescriptions  Medication Sig Dispense Refill  . Calcium Carbonate (CALCIUM 600 PO) Take 1 tablet by mouth daily.    . Cholecalciferol (VITAMIN D3) 2000 UNITS TABS Take by mouth as directed.    . Coenzyme Q10 50 MG CAPS Take 120 mg by mouth daily.     Marland Kitchen donepezil (ARICEPT) 5 MG tablet Take by mouth.    Marland Kitchen MAGNESIUM PO Take 400 mg by mouth as directed.     . mirtazapine (REMERON) 7.5 MG tablet Take 1 tablet (7.5 mg total) by mouth at bedtime. 30 tablet 11  . Multiple Vitamin (MULTIVITAMIN) tablet Take 1 tablet by mouth daily.      . Probiotic Product (PROBIOTIC PO) Take by mouth daily.      . vitamin C (ASCORBIC ACID) 500 MG tablet Take 500 mg by mouth daily.    . vitamin E 400 UNIT capsule Take 400 Units by mouth daily.      Marland Kitchen dronabinol (MARINOL) 2.5 MG capsule      No current facility-administered medications for this visit.    PAST MEDICAL HISTORY: Past Medical History  Diagnosis Date  . NSVD (normal spontaneous  vaginal delivery)     x 1  . Wrist fracture 01/2004    fell on tennis court, muscle problem caused  fall  . Pure hypercholesterolemia   . Disorders of bilirubin excretion   . Loss of weight   . 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 Allergy   . COPD (chronic obstructive pulmonary disease) ([place])   . Chronic headache   . Anxiety   . Memory loss     PAST SURGICAL HISTORY: Past Surgical History  Procedure Laterality Date  . Breast lumpectomy      bilateral, age 45  . Tubal ligation      FAMILY HISTORY: Family History  Problem Relation Age of Onset  . Alzheimer's disease Mother   . Alzheimer's  disease Father   . Stroke Father     x 5  . Heart disease Father   . Cancer Paternal Aunt     throat  . Cancer Paternal Grandmother     throat  . Diabetes Neg Hx   . Alcohol abuse Neg Hx   . Colon cancer Neg Hx     SOCIAL HISTORY:  Social History   Social History  . Marital Status: Married    Spouse Name: N/A  . Number of Children: 1  . Years of Education: 2 yrs coll   Occupational History  . Housewife    Social History Main Topics  . Smoking status: Never Smoker   . Smokeless tobacco: Never Used  . Alcohol Use: No  . Drug Use: No  . Sexual Activity: No   Other Topics Concern  . Not on file   Social History Narrative   Desires CPR.   Lives at home with her husband.   Right-handed.   No caffeine use.     PHYSICAL EXAM   Filed Vitals:   05/14/16 1320  BP: 122/96  Pulse: 96  Height: 5' 6.25" (1.683 m)  Weight: 79 lb 8 oz (36.061 kg)    Not recorded      Body mass index is 12.73 kg/(m^2).  PHYSICAL EXAMNIATION:  Gen: NAD, conversant, well nourised, obese, well groomed                     Cardiovascular: Regular rate rhythm, no peripheral edema, warm, nontender. Eyes: Conjunctivae clear without exudates or hemorrhage Neck: Supple, no carotid bruise. Pulmonary: Clear to auscultation bilaterally   NEUROLOGICAL EXAM:  MENTAL STATUS:Very faint elderly lady, Speech:    Speech is normal; fluent and spontaneous with normal comprehension.  Cognition: Mini-Mental Status Examination is 25/30, animal naming 15     Orientation to time, place and person: She missed the season     Recent and remote memory: She missed 3 out of 3 recall     Normal Attention span and concentration     Normal Language, naming, repeating,spontaneous speech she has difficulty copy design     Fund of knowledge,    CRANIAL NERVES: CN II: Visual fields are full to confrontation. Fundoscopic exam is normal with sharp discs and no vascular changes. Pupils are round equal and briskly  reactive to light. CN III, IV, VI: extraocular movement are normal. No ptosis. CN V: Facial sensation is intact to pinprick in all 3 divisions bilaterally. Corneal responses are intact.  CN VII: Face is symmetric with normal eye closure and smile. CN VIII: Hearing is normal to rubbing fingers CN IX, X: Palate elevates symmetrically. Phonation is normal. CN XI: Head  turning and shoulder shrug are intact CN XII: Tongue is midline with normal movements and no atrophy.  MOTOR: There is no pronator drift of out-stretched arms. Muscle bulk and tone are normal. Muscle strength is normal.  REFLEXES: Reflexes are 2+ and symmetric at the biceps, triceps, knees, and ankles. Plantar responses are flexor.  SENSORY: Intact to light touch, pinprick, positional sensation and vibratory sensation are intact in fingers and toes.  COORDINATION: Rapid alternating movements and fine finger movements are intact. There is no dysmetria on finger-to-nose and heel-knee-shin.    GAIT/STANCE: Posture is normal. Gait is steady with normal steps, base, arm swing, and turning. Heel and toe walking are normal. Tandem gait is normal.  Romberg is absent.   DIAGNOSTIC DATA (LABS, IMAGING, TESTING) - I reviewed patient records, labs, notes, testing and imaging myself where available.   ASSESSMENT AND PLAN  Haley Kim is a 70 y.o. 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 Valentina Shaggy  in June 24 2016  A component of depression  Increase Remeron to 7.5 mg 2 tabs every day Depression, hallucinations  If she continue have significant nighttime agitations, may consider add on Abilify/or seroquel    Marcial Pacas, M.D. Ph.D.  [facility] [address], [place], [place] [address] Ph: [phone] Fax: [phone]  [id number] Reche Dixon, MD

## 2016-06-24 ENCOUNTER — Ambulatory Visit: Payer: Medicare Other | Attending: Psychology | Admitting: Psychology

## 2016-06-24 DIAGNOSIS — G3184 Mild cognitive impairment, so stated: Secondary | ICD-10-CM | POA: Diagnosis not present

## 2016-06-24 NOTE — Progress Notes (Signed)
[facility]  [address]   Telephone [phone] [address] Fax [phone] [place], [place] [address]  Initial Contact Note  Name:  Haley Kim Date of Birth; 05/31/1946 MRN:  [id number] Date:  06/24/2016  Haley Kim is an 70 y.o. female who was referred for neuropsychological evaluation by Marcial Pacas, MD due to history of progressive memory loss.    A total of 6 hours was spent today reviewing medical records, interviewing (CPT 90791/96116) Haley Kim and administering and scoring neurocognitive tests (CPTs 802 [id number] & [id number]).  Diagnostic Impression: Mild Cognitive impairment, multiple domains [G31.84]  There were no concerns expressed or behaviors displayed by Haley Kim that would require immediate attention.   A full report will follow once the planned testing has been completed. Her next appointment is scheduled for 07/02/16.   Jamey Ripa, Ph.D Licensed Psychologist 06/24/2016

## 2016-06-25 ENCOUNTER — Encounter: Payer: Self-pay | Admitting: Psychology

## 2016-07-02 ENCOUNTER — Ambulatory Visit: Payer: Medicare Other | Attending: Psychology | Admitting: Psychology

## 2016-07-02 DIAGNOSIS — G3184 Mild cognitive impairment, so stated: Secondary | ICD-10-CM | POA: Insufficient documentation

## 2016-07-02 NOTE — Progress Notes (Signed)
[facility]  [address]   Telephone [phone] [address] Fax [phone] [place], [place] [address]   NEUROPSYCHOLOGICAL EVALUATION  *CONFIDENTIAL* This report should not be released without the consent of the client  Name:   Nansi Birmingham. Schriner   Date of Birth:  [birth year] [facility] MR#:  [id number] Date of Evaluation: 06/24/16   Reason for Referral Pema Thomure is a 70 year old right-handed woman who was referred for neuropsychological evaluation by Levert Feinstein, MD of [facility] for evaluation of an approximate three year history of gradual memory decline. She also has displayed significant and unintentional weight loss concurrent with her memory loss. Lab studies in February 2017 were normal. She was started on donepezil in March 2017. Dr. Terrace Arabia described a MRI brain scan performed in April 2017 as having shown mild generalized atrophy and supratentorial small vessel disease.   Sources of Information Electronic medical records from the [facility] were reviewed. Ms. Mohar, and her husband, Mr. Katrianna Friesenhahn, were interviewed.   Chief Complaints & Current Status Ms. Huesca stated that she has noticed a decline in her memory over the past year. She gave examples of forgetting where she put something, having difficulty finding words while speaking, having a shorter attention span and not remembering to do something as planned. In contrast, her husband reported that she has demonstrated a decline in memory over at least the past three years. He reported that around the same time she also complained of poor appetite and began to unintentionally lose weight eventually losing over forty pounds. Ms. Show acknowledged being too thin by at least twenty five pounds. She denied having ever intentionally limited her caloric intake, induced vomiting or exercised excessively to lose weight. Her husband reported that over the past five  years she has spent an increasing amount of time in bed, now upwards of eighteen hours per day, with complaint of low energy. He reported that within the past six months she has experienced occasional episodes of nighttime confusion in which she woke up from sleep looking for her mother who had passed away a few years ago. Ms. Gin reported no recollection of these episodes. He has not observed her to appear sad nor express any themes of depression. There was no report of any behavioral disturbances, including wandering, aggression, self-destructive behavior, impulsivity, delusional thinking or unsafe behavior. She continues to occasionally drive locally without incident.  Currently, she reports ongoing poor appetite and continual weight loss despite recently increased eating (husband confirms). Her current weight is 72 lbs. She estimated that she sleeps about 12 hours within a 24 hour period though her husband stated his belief that she sleeps closer to 18 hours most days. She reported no longer having enough energy to do many of the physical and social activities that she still enjoys. She particularly misses playing tennis with her friends, which she stopped doing about six months ago.  She spends most of her time watching television. She meets with friends about once a month. She denied experiencing sad mood, apathy, mania, loss of interests, hopelessness, suicidal thoughts, hallucinations and delusions. She did not report any ongoing life stressors. She reportedly continues to drive locally and pay her bills without difficulty.  Background She lives with her husband of forty eight years. They have an adult daughter. 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  She reported that she attended college  for two years but did not get a degree. She reported a history of "mediocre" grades but did not cite any school-based problems with attention or  learning.  Her past medical history was notable for chronic obstructive pulmonary disease and hyperlipidemia.  Her current medications include donepezil, dronabinol, mirtazapine and various vitamins.   She reported no history of head injury, loss of consciousness, seizure activity, stroke-like symptoms or exposure to toxic chemicals.   She denied abuse of alcohol or use of illicit drugs or tobacco products.   She reported no history of serious emotional difficulties. Her first and only mental health contact was a consultation with a psychologist earlier this year about her weight loss. Ms. Capp stated she was told that no psychological treatment was indicated.   With regards to her family medical history, she reported that her father developed dementia, possibly Alzheimer's disease. She was not aware of any family history of psychiatric disorder.  Observations She appeared as an emaciated woman who was appropriately dressed and groomed. She appeared alert and in no apparent physical distress. Her gait appeared normal. She interacted in a pleasant manner. She did not display signs of emotional distress. Her affect appeared within a wide range and without lability. No problems were evident for speech articulation, prosody, word usage, word finding or oral comprehension. Her thought processes were coherent and organized without loose associations, flight of ideas or verbal perseverations. Her thought content was devoid of unusual or bizarre ideas.  Evaluation Procedures In addition to a review of medical records as well as clinical interview, the following tests or questionnaires were administered: Animal Naming Test  [place] Clock Drawing Test Controlled Oral Word Association Test Geriatric Depression Scale (short form) Modified [place] Card Sorting Test Rey Complex Figure: Copy Trail Making A & B Wechsler Adult Intelligence Scale-IV: Music therapist, Coding, Digit Span &  Similarities  Wechsler Memory Scale-IV: Older Adult Battery Wide Range Achievement Test-4 (Word Reading)  Assessment Results Observations & Interpretative Considerations Test results were overall deemed to represent a valid measure of her cognitive functioning. Despite her emaciated appearance and report of low energy level, she did not display any problems with maintaining alertness or energy level. She did not exhibit signs of physical or emotional discomfort. She did not report or display problems with vision, hearing or motor skills. She appeared to sustain attention and persist to task. There were no signs of careless, impulsive or perseverative responding. She was judged to have put forth optimal effort.    Her pre-morbid intellectual potential was estimated to fall within the Average range based on her educational and occupational background coupled with a measure of word reading skill (Wide Range Achievement Test-4).  Her test scores were corrected to reflect norms for her age and, whenever possible, her gender and educational level (i.e., 14 years).   Attention & Executive Function Her performances on measures of processing speed were mixed. Her speed to transcribe symbols to match digits using a key (Wechsler Adult Intelligence Scale-IV (WAIS-IV) Coding) fell within the Average range. In contrast, her speed to connect numbers randomly arrayed on a page in numerical sequence (Trials A) fell within the impaired range.    Her attentional capacity to hold and manipulate auditory information within working memory was at least Average based on her ability to mentally rearrange digit sequences in reverse or ascending order (WAIS-IV Digit Span). A measure of her visual working memory that required immediate recognition of symbols in left to right order (Wechsler  Memory Scale-IV (WMS-IV) Symbol Span) fell at the lower boundary of the Low Average range.   Her speed to complete a test that required  complex sequencing and mental shifting (Trails B) fell within the Low Average range. Of note, she deviated one time from the alternating and ascending number to letter sequence.     Measures of verbal productivity were lower than expected. Her ability to name members of a category (Animal Naming Test) fell within the Low Average range. Her ability to generate words to designated letters (Controlled Oral Word Association Test) fell within the Borderline/Mildly impaired range.   Her performance on a measure of verbal reasoning (WAIS-IV Similarities) that required classification of ostensibly different objects or ideas to a shared category was within the Low Average range.   She scored at the lower boundary of the Low Average range on a test of nonverbal problem-solving and conceptual flexibility that required inferring logical ways to sort geometric designs on the basis of ongoing feedback (Modified [employer]).  Learning & Memory On the WMS-IV, her Immediate Memory Index (IMI), a composite measure of her ability to recall verbal and visual information immediately after the stimuli was presented, fell within the mildly impaired range at the 4th percentile. There was no discrepancy between her performances on auditory versus visual tasks. Her Delayed Memory Index (DMI), a combined measure of her ability to recall verbal and visual information after a 20 to 30 minute delay, fell within the severely impaired range at below the 1st percentile. Her DMI was significantly lower than expected given her IMI, which indicated abnormal forgetting over the course of the delay interval. She showed declines in her delayed recall on all subtests compared to her initial level of encoding. While she demonstrated improved delayed recall when tested on a recognition format, her recognition memory scores remained well within the subnormal range.   Language Her ability to name drawings of objects to  confrontation [facility] Naming Test) fell within the Low Average range. As noted above, a measure of her word fluency was subnormal. Her word reading skill (Wide Range Achievement Test-4) fell within the Average range.  Visual-Spatial Organization & Visual-Construction  There were no signs of spatial inattention or problems with visual recognition. Her ability to assemble two-dimensional block designs from models Product manager) fell within the Average range. Her drawing of a spatially-complex geometric design (Rey Complex Figure) was seriously distorted primarily due to misplacements of internal design features. In contrast, her ability to draw a clock face and set the hands to a designated time [employer]) was normal.  Emotional Status Her score of 5/15 on the Geriatric Depression Scale (short form) was not suggestive of depression. The items she endorsed reflected her feeling that her life is empty "to a degree", that she is often bored, that she would rather stay at home, that she is not full of energy and that she "sometimes" feels worthless.      Summary & Conclusions Nada Godley is a 70 year old woman with an approximate three year history of gradual memory decline and unintentional weight loss. In addition, for the past five years she has spent most of her time in bed with complaint of low energy. Within the past six months she has demonstrated occasional episodes of nighttime confusion. Her behavior has been otherwise appropriate.  Neuropsychological testing indicated mild to moderate impairment of memory at the stages of acquiring and later retrieving new information. In addition, measures of spatially-complex  drawing and phonemic fluency were within the subnormal range. Her confrontational naming, semantic fluency and most tested executive functions fell within the Low Average range, which was deemed to represent a mild decline from her estimated pre-morbid level. Of note,  despite her gaunt appearance and report of low energy level, she did not display any problems maintaining alertness or energy level throughout this evaluation.  With regards to her psychological functioning, she denied feeling depressed, apathetic or unduly anxious. She reported a desire to be more physically and socially active though cited insufficient energy. She conveyed an accurate perception of her body image and denied intentionally limiting her caloric intake.  In conclusion, it was difficult to determine to what degree her cognitive deficits evident on testing might be impacting her activities of daily living as she spends most of her time these days in bed. 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 While she expressed dissatisfaction with her life due to not having sufficient energy to engage in valued activities, there were no indications of serious mood disturbance. Finally, her continual weight loss does not appear to reflect a psychologically-based eating disorder.  Recommendations 1. Consider referring her to a Registered Dietitian Nutritionist to develop a plan to increase her caloric intake. Her husband stated that when she last met with a nutritional professional in 2013 she was not fully compliant with advice. Ms. Johal stated that she is now motivated to accept this type of help. Any input should be given to her in writing to ensure that she will learn and remember it.   2. Neuropsychological re-evaluation is suggested in one to two years to track for changes, if any, in her cognitive functioning.    I have appreciated the opportunity to evaluate Ms. Buena Irish. The results and recommendations from this evaluation were discussed with her and her husband on 07/02/16. Please feel free to contact  me with any comments or questions.    ______________________ Jamey Ripa, Ph.D Licensed Psychologist      ADDENDUM-NEUROPSYCHOLOGICAL TEST RESULTS  Animal Naming Test Score= [phone] (adjusted for age, gender and educational level)   [place] Naming Test Score= 34/60 14th (adjusted for age, gender and educational level)   Clock Drawing Test Score= 9/10 Normal    Controlled Oral Word Association Test Score= 52 words/5 repetitions    5th (adjusted for age, gender and educational level)   Modified [place] Card Sorting Test  Categories correct        4  12th (adjusted for age and education)  Perseverative errors       7  12th               Total errors      22  21st           Executive function composite     80    9th            Rey Complex Figure: copy       Score= 21/36   2nd    Trails A Score=  71s  0e  1st (adjusted for age, gender and educational level)  Trails B Score= 113s 1e  21st (adjusted for age, gender and educational level)   Wechsler Adult Intelligence Scale-IV Subtest Scaled Score Percentile  Block Design    9 37th     Similarities  7 16th     Digit Span  Forward               Backward               Sequencing   _0 50th   37th  84th      37th        Coding       8 25th         Wechsler Memory Scale-IV Older Adult Battery Index Index Score Percentile  Immediate Memory  73   4th        Auditory Memory  67   1st      Visual Memory  69    2nd     Delayed Memory  58    <1st      Symbol Span subtest  Scaled score=6     9th       Wide Range Achievement Test-4  Subtest  Raw score Standard score Percentile  Word Reading 64/70  109 73rd

## 2016-07-02 NOTE — Evaluation (Deleted)
[facility]  [address]   Telephone [phone] [address] Fax [phone] [place], [place] [address]   NEUROPSYCHOLOGICAL EVALUATION  *CONFIDENTIAL* This report should not be released without the consent of the client  Name:   Haley Kim   Date of Birth:  [birth year] [facility] MR#:  [id number] Date of Evaluation: 06/24/16   Reason for Referral Haley Kim is a 71 year old right-handed woman who was referred for neuropsychological evaluation by Marcial Pacas, MD of [facility] for evaluation of an approximate three year history of gradual memory decline. She also has displayed significant and unintentional weight loss concurrent with her memory loss. Lab studies in February 2017 were normal. She was started on donepezil in March 2017. Dr. Krista Blue described a MRI brain scan performed in April 2017 as having shown mild generalized atrophy and supratentorial small vessel disease.   Sources of Information Electronic medical records from the [facility] were reviewed. Haley Kim, and her husband, Mr. Nashea Chumney, were interviewed.   Chief Complaints & Current Status Haley Kim stated that she has noticed a decline in her memory over the past year. She gave examples of forgetting where she put something, having difficulty finding words while speaking, having a shorter attention span and not remembering to do something as planned. In contrast, her husband reported that she has demonstrated a decline in memory over at least the past three years. He reported that around the same time she also complained of poor appetite and began to unintentionally lose weight eventually losing over forty pounds. Haley Kim acknowledged being too thin by at least twenty five pounds. She denied having ever intentionally limited her caloric intake, induced vomiting or exercised excessively to lose weight. Her husband reported that over the past five  years she has spent an increasing amount of time in bed, now upwards of eighteen hours per day, with complaint of low energy. He reported that within the past six months she has experienced occasional episodes of nighttime confusion in which she woke up from sleep looking for her mother who had passed away a few years ago. Haley Kim reported no recollection of these episodes. He has not observed her to appear sad nor express any themes of depression. There was no report of any behavioral disturbances, including wandering, aggression, self-destructive behavior, impulsivity, delusional thinking or unsafe behavior. She continues to occasionally drive locally without incident.  Currently, she reports ongoing poor appetite and continual weight loss despite recently increased eating (husband confirms). Her current weight is 72 lbs. She estimated that she sleeps about 12 hours within a 24 hour period though her husband stated his belief that she sleeps closer to 18 hours most days. She reported no longer having enough energy to do many of the physical and social activities that she still enjoys. She particularly misses playing tennis with her friends, which she stopped doing about six months ago.  She spends most of her time watching television. She meets with friends about once a month. She denied experiencing sad mood, apathy, mania, loss of interests, hopelessness, suicidal thoughts, hallucinations and delusions. She did not report any ongoing life stressors. She reportedly continues to drive locally and pay her bills without difficulty.  Background She lives with her husband of 13 years. They have an adult daughter. 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  She reported that she attended college  for two years but did not get a degree. She reported a history of "mediocre" grades but did not cite any school-based problems with attention or  learning.  Her past medical history was notable for chronic obstructive pulmonary disease and hyperlipidemia.  Her current medications include donepezil, dronabinol, mirtazapine and various vitamins.   She reported no history of head injury, loss of consciousness, seizure activity, stroke-like symptoms or exposure to toxic chemicals.   She denied abuse of alcohol or use of illicit drugs or tobacco products.   She reported no history of serious emotional difficulties. Her first and only mental health contact was a consultation with a psychologist earlier this year about her weight loss. Haley Kim stated she was told that no psychological treatment was indicated.   With regards to her family medical history, she reported that her father developed dementia, possibly Alzheimer's disease. She was not aware of any family history of psychiatric disorder.  Observations She appeared as an emaciated woman who was appropriately dressed and groomed. She appeared alert and in no apparent physical distress. Her gait appeared normal. She interacted in a pleasant manner. She did not display signs of emotional distress. Her affect appeared within a wide range and without lability. No problems were evident for speech articulation, prosody, word usage, word finding or oral comprehension. Her thought processes were coherent and organized without loose associations, flight of ideas or verbal perseverations. Her thought content was devoid of unusual or bizarre ideas.  Evaluation Procedures In addition to a review of medical records as well as clinical interview, the following tests or questionnaires were administered: Animal Naming Test  [place] Clock Drawing Test Controlled Oral Word Association Test Geriatric Depression Scale (short form) Modified [place] Card Sorting Test Rey Complex Figure: Copy Trail Making A & B Wechsler Adult Intelligence Scale-IV: Music therapist, Coding, Digit Span &  Similarities  Wechsler Memory Scale-IV: Older Adult Battery Wide Range Achievement Test-4 (Word Reading)  Assessment Results Observations & Interpretative Considerations Test results were overall deemed to represent a valid measure of her cognitive functioning. Despite her emaciated appearance and report of low energy level, she did not display any problems with maintaining alertness or energy level. She did not exhibit signs of physical or emotional discomfort. She did not report or display problems with vision, hearing or motor skills. She appeared to sustain attention and persist to task. There were no signs of careless, impulsive or perseverative responding. She was judged to have put forth optimal effort.    Her pre-morbid intellectual potential was estimated to fall within the Average range based on her educational and occupational background coupled with a measure of word reading skill (Wide Range Achievement Test-4).  Her test scores were corrected to reflect norms for her age and, whenever possible, her gender and educational level (i.e., 14 years).   Attention & Executive Function Her performances on measures of processing speed were mixed. Her speed to transcribe symbols to match digits using a key (Wechsler Adult Intelligence Scale-IV (WAIS-IV) Coding) fell within the Average range. In contrast, her speed to connect numbers randomly arrayed on a page in numerical sequence (Trials A) fell within the impaired range.    Her attentional capacity to hold and manipulate auditory information within working memory was at least Average based on her ability to mentally rearrange digit sequences in reverse or ascending order (WAIS-IV Digit Span). A measure of her visual working memory that required immediate recognition of symbols in left to right order (Wechsler  Memory Scale-IV (WMS-IV) Symbol Span) fell at the lower boundary of the Low Average range.   Her speed to complete a test that required  complex sequencing and mental shifting (Trails B) fell within the Low Average range. Of note, she deviated one time from the alternating and ascending number to letter sequence.     Measures of verbal productivity were lower than expected. Her ability to name members of a category (Animal Naming Test) fell within the Low Average range. Her ability to generate words to designated letters (Controlled Oral Word Association Test) fell within the Borderline/Mildly impaired range.   Her performance on a measure of verbal reasoning (WAIS-IV Similarities) that required classification of ostensibly different objects or ideas to a shared category was within the Low Average range.   She scored at the lower boundary of the Low Average range on a test of nonverbal problem-solving and conceptual flexibility that required inferring logical ways to sort geometric designs on the basis of ongoing feedback (Modified [employer]).  Learning & Memory On the WMS-IV, her Immediate Memory Index (IMI), a composite measure of her ability to recall verbal and visual information immediately after the stimuli was presented, fell within the mildly impaired range at the 4th percentile. There was no discrepancy between her performances on auditory versus visual tasks. Her Delayed Memory Index (DMI), a combined measure of her ability to recall verbal and visual information after a 20 to 30 minute delay, fell within the severely impaired range at below the 1st percentile. Her DMI was significantly lower than expected given her IMI, which indicated abnormal forgetting over the course of the delay interval. She showed declines in her delayed recall on all subtests compared to her initial level of encoding. While she demonstrated improved delayed recall when tested on a recognition format, her recognition memory scores remained well within the subnormal range.   Language Her ability to name drawings of objects to  confrontation [facility] Naming Test) fell within the Low Average range. As noted above, a measure of her word fluency was subnormal. Her word reading skill (Wide Range Achievement Test-4) fell within the Average range.  Visual-Spatial Organization & Visual-Construction  There were no signs of spatial inattention or problems with visual recognition. Her ability to assemble two-dimensional block designs from models Counsellor) fell within the Average range. Her drawing of a spatially-complex geometric design (Rey Complex Figure) was seriously distorted primarily due to misplacements of internal design features. In contrast, her ability to draw a clock face and set the hands to a designated time [employer]) was normal.  Emotional Status Her score of 5/15 on the Geriatric Depression Scale (short form) was not suggestive of depression. The items she endorsed reflected her feeling that her life is empty "to a degree", that she is often bored, that she would rather stay at home, that she is not full of energy and that she "sometimes" feels worthless.      Summary & Conclusions Haley Kim is a 70 year old woman with an approximate three year history of gradual memory decline and unintentional weight loss. In addition, for the past five years she has spent most of her time in bed with complaint of low energy. Within the past six months she has demonstrated occasional episodes of nighttime confusion. Her behavior has been otherwise appropriate.  Neuropsychological testing indicated mild to moderate impairment of memory at the stages of acquiring and later retrieving new information. In addition, measures of spatially-complex  drawing and phonemic fluency were within the subnormal range. Her confrontational naming, semantic fluency and most tested executive functions fell within the Low Average range, which was deemed to represent a mild decline from her estimated pre-morbid level. Of note,  despite her gaunt appearance and report of low energy level, she did not display any problems maintaining alertness or energy level throughout this evaluation.  With regards to her psychological functioning, she denied feeling depressed, apathetic or unduly anxious. She reported a desire to be more physically and socially active though cited insufficient energy. She conveyed an accurate perception of her body image and denied intentionally limiting her caloric intake.  In conclusion, it was difficult to determine to what degree her cognitive deficits evident on testing might be impacting her activities of daily living as she spends most of her time these days in bed. 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 While she expressed dissatisfaction with her life due to not having sufficient energy to engage in valued activities, there were no indications of serious mood disturbance. Finally, her continual weight loss does not appear to reflect a psychologically-based eating disorder.  Recommendations 1. Consider referring her to a Registered Dietitian Nutritionist to develop a plan to increase her caloric intake. Her husband stated that when she last met with a nutritional professional in 2013 she was not fully compliant with advice. Haley Kim stated that she is now motivated to accept this type of help. Any input should be given to her in writing to ensure that she will learn and remember it.   2. Neuropsychological re-evaluation is suggested in one to two years to track for changes, if any, in her cognitive functioning.    I have appreciated the opportunity to evaluate Haley Kim. The results and recommendations from this evaluation were discussed with her and her husband on 07/02/16. Please feel free to contact  me with any comments or questions.    ______________________ Jamey Ripa, Ph.D Licensed Psychologist      ADDENDUM-NEUROPSYCHOLOGICAL TEST RESULTS  Animal Naming Test Score= [phone] (adjusted for age, gender and educational level)   [place] Naming Test Score= 34/60 14th (adjusted for age, gender and educational level)   Clock Drawing Test Score= 9/10 Normal    Controlled Oral Word Association Test Score= 52 words/5 repetitions    5th (adjusted for age, gender and educational level)   Modified [place] Card Sorting Test  Categories correct        4  12th (adjusted for age and education)  Perseverative errors       7  12th               Total errors      110  21st           Executive function composite     80    9th            Rey Complex Figure: copy       Score= 21/36   2nd    Trails A Score=  71s  0e  1st (adjusted for age, gender and educational level)  Trails B Score= 113s 1e  21st (adjusted for age, gender and educational level)   Wechsler Adult Intelligence Scale-IV Subtest Scaled Score Percentile  Block Design    9 37th     Similarities  7 16th     Digit Span  Forward               Backward               Sequencing   _0 50th   37th  84th      37th        Coding       8 25th         Wechsler Memory Scale-IV Older Adult Battery Index Index Score Percentile  Immediate Memory  73   4th        Auditory Memory  67   1st      Visual Memory  69    2nd     Delayed Memory  58    <1st      Symbol Span subtest  Scaled score=6     9th       Wide Range Achievement Test-4  Subtest  Raw score Standard score Percentile  Word Reading 64/70  109 73rd

## 2016-07-20 ENCOUNTER — Ambulatory Visit (INDEPENDENT_AMBULATORY_CARE_PROVIDER_SITE_OTHER): Payer: Medicare Other | Admitting: Neurology

## 2016-07-20 ENCOUNTER — Encounter: Payer: Self-pay | Admitting: Neurology

## 2016-07-20 VITALS — BP 146/89 | HR 73 | Ht 66.25 in | Wt 78.8 lb

## 2016-07-20 DIAGNOSIS — E46 Unspecified protein-calorie malnutrition: Secondary | ICD-10-CM | POA: Insufficient documentation

## 2016-07-20 DIAGNOSIS — F329 Major depressive disorder, single episode, unspecified: Secondary | ICD-10-CM | POA: Diagnosis not present

## 2016-07-20 DIAGNOSIS — F32A Depression, unspecified: Secondary | ICD-10-CM

## 2016-07-20 DIAGNOSIS — R413 Other amnesia: Secondary | ICD-10-CM | POA: Diagnosis not present

## 2016-07-20 NOTE — Progress Notes (Signed)
PATIENT: Haley Kim DOB: 12-26-1945  Chief Complaint  Patient presents with  . Memory Loss    MMSE 27/30 - 15 animals.  She is here with her husband, [name].  They would like to review her neuropsychiatic testing by Dr. Valentina Shaggy.  She is sleeping better with the increased dose of Remeron but often has bad dreams.     HISTORICAL  Haley Kim is a 70 years old right-handed female, accompanied by her husband [name], seen in refer by her primary care physician Dr. Leamon Arnt for evaluation of memory loss on May third 2017.  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,  She was noted to have memory loss since 2014, concurrent with her memory loss, she also suffered significant weight loss, lost 40 pounds over past few years, she stayed home most of the time, she used to enjoy playing tennis, she has not play with her friend for 3 months, she stay in bed most of the time, lost appetite, no longer active in her household duty, watching TV when she is awake, she also complains of frequent headaches, bilateral frontal temporal region low-grade pressure pain  Laboratory evaluation in February 2017 showed normal CBC CMP TSH   We have personally reviewed MRI brain scan from [facility] in April 2017, mild generalized atrophy, supratentorium small vessel disease, no acute lesions. She was started on Aricept 5 mg a month ago, was able to tolerate the medications  UPDATE June 15th 2017: She is able to tolerate Remeron 7.5 mg every night, which has helped her appetite, she can sleep better, is more motivated, but over the past one month, she was noted to have few episode of nighttime confusion, woke up from sleep looking for her mother, who has passed away few years ago, she has no recollection of the event,  We reviewed laboratory evaluation, normal B12, vitamin D.  Update July 20 2016: I have  personally reviewed MRI of the brain April 2017 from [facility], no acute process, mild atrophy chronic small vessel disease, She continue have decreased appetite, stayed in bed most of the time, lack of energy   I also reviewed Dr. Monico Hoar report, there was evidence of mild to moderate impairment requiring and later retrieving new information, in addition, there is evidence of spatial the complex drawing,phonemic fluency were within subnormal range, her confrontational naming, semantic fluency, and executive function fell within the low average range. This represent decline from her estimated baseline level, she was given the diagnosis of mild cognitive impairment,   REVIEW OF SYSTEMS: Full 14 system review of systems performed and notable only for Memory loss, weakness  ALLERGIES: No Known Allergies  HOME MEDICATIONS: Current Outpatient Prescriptions  Medication Sig Dispense Refill  . Calcium Carbonate (CALCIUM 600 PO) Take 1 tablet by mouth daily.    . Cholecalciferol (VITAMIN D3) 2000 UNITS TABS Take by mouth as directed.    . Coenzyme Q10 50 MG CAPS Take 120 mg by mouth daily.     Marland Kitchen donepezil (ARICEPT) 5 MG tablet Take by mouth.    . dronabinol (MARINOL) 2.5 MG capsule     . MAGNESIUM PO Take 400 mg by mouth as directed.     . mirtazapine (REMERON) 7.5 MG tablet Take 2 tablets (15 mg total) by mouth at bedtime. 60 tablet 11  . Multiple Vitamin (MULTIVITAMIN) tablet Take 1 tablet by mouth daily.      Marland Kitchen  Probiotic Product (PROBIOTIC PO) Take by mouth daily.      . vitamin C (ASCORBIC ACID) 500 MG tablet Take 500 mg by mouth daily.    . vitamin E 400 UNIT capsule Take 400 Units by mouth daily.       No current facility-administered medications for this visit.     PAST MEDICAL HISTORY: Past Medical History:  Diagnosis Date  . Allergy   . Anxiety   . Chronic headache   . COPD (chronic obstructive pulmonary disease) ([place])   . Disorders of bilirubin excretion   . Loss of weight    . Memory loss   . NSVD (normal spontaneous vaginal delivery)    x 1  . Pure hypercholesterolemia   . 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 Wrist fracture 01/2004   fell on tennis court, muscle problem caused  fall    PAST SURGICAL HISTORY: Past Surgical History:  Procedure Laterality Date  . BREAST LUMPECTOMY     bilateral, age 71  . TUBAL LIGATION      FAMILY HISTORY: Family History  Problem Relation Age of Onset  . Alzheimer's disease Mother   . Alzheimer's disease Father   . Stroke Father     x 5  . Heart disease Father   . Cancer Paternal Aunt     throat  . Cancer Paternal Grandmother     throat  . Diabetes Neg Hx   . Alcohol abuse Neg Hx   . Colon cancer Neg Hx     SOCIAL HISTORY:  Social History   Social History  . Marital status: Married    Spouse name: N/A  . Number of children: 1  . Years of education: 2 yrs coll   Occupational History  . Housewife    Social History Main Topics  . Smoking status: Never Smoker  . Smokeless tobacco: Never Used  . Alcohol use No  . Drug use: No  . Sexual activity: No   Other Topics Concern  . Not on file   Social History Narrative   Desires CPR.   Lives at home with her husband.   Right-handed.   No caffeine use.     PHYSICAL EXAM   Vitals:   07/20/16 1510  BP: (!) 146/89  Pulse: 73  Weight: 78 lb 12 oz (35.7 kg)  Height: 5' 6.25" (1.683 m)    Not recorded      Body mass index is 12.61 kg/m.  PHYSICAL EXAMNIATION:  Gen: NAD, conversant, well nourised, obese, well groomed                     Cardiovascular: Regular rate rhythm, no peripheral edema, warm, nontender. Eyes: Conjunctivae clear without exudates or hemorrhage Neck: Supple, no carotid bruise. Pulmonary: Clear to auscultation bilaterally   NEUROLOGICAL EXAM:  MENTAL STATUS:Very faint elderly lady, Speech:    Speech is normal; fluent and spontaneous with normal comprehension.  Cognition: Mini-Mental  Status Examination is 27/30, animal naming 15     Orientation to time, place and person: She missed the season     Recent and remote memory: She missed 3 out of 3 recall     Normal Attention span and concentration     Normal Language, naming, repeating,spontaneous speech she has difficulty copy design     Fund of knowledge,    CRANIAL NERVES: CN II: Visual fields are full to confrontation. Fundoscopic exam is normal with sharp discs and no vascular  changes. Pupils are round equal and briskly reactive to light. CN III, IV, VI: extraocular movement are normal. No ptosis. CN V: Facial sensation is intact to pinprick in all 3 divisions bilaterally. Corneal responses are intact.  CN VII: Face is symmetric with normal eye closure and smile. CN VIII: Hearing is normal to rubbing fingers CN IX, X: Palate elevates symmetrically. Phonation is normal. CN XI: Head turning and shoulder shrug are intact CN XII: Tongue is midline with normal movements and no atrophy.  MOTOR: There is no pronator drift of out-stretched arms. Muscle bulk and tone are normal. Muscle strength is normal.  REFLEXES: Reflexes are 2+ and symmetric at the biceps, triceps, knees, and ankles. Plantar responses are flexor.  SENSORY: Intact to light touch, pinprick, positional sensation and vibratory sensation are intact in fingers and toes.  COORDINATION: Rapid alternating movements and fine finger movements are intact. There is no dysmetria on finger-to-nose and heel-knee-shin.    GAIT/STANCE: Posture is normal. Gait is steady with normal steps, base, arm swing, and turning. Heel and toe walking are normal. Tandem gait is normal.  Romberg is absent.   DIAGNOSTIC DATA (LABS, IMAGING, TESTING) - I reviewed patient records, labs, notes, testing and imaging myself where available.   ASSESSMENT AND PLAN  Haley Kim is a 70 y.o. 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 Ph.D.  [facility] [address] [place], [place] [address] Ph: [phone] Fax: [phone]  [id number] Reche Dixon, MD

## 2016-07-22 ENCOUNTER — Telehealth: Payer: Self-pay | Admitting: Neurology

## 2016-07-22 NOTE — Telephone Encounter (Signed)
Spouse called to advise, when patient was here on Monday, Dr. Krista Blue was going to refer to Dietician and someone would contact patient, spouse states no one has called.

## 2016-07-23 NOTE — Telephone Encounter (Signed)
Called and spoke to [place] at [phone] she will be calling patient today and get her scheduled . I relayed to Otila Kluver that I would call patient and let her know you were calling and she relayed no she will call patient as soon as we hung up the telephone.

## 2016-07-30 ENCOUNTER — Ambulatory Visit: Payer: Self-pay | Admitting: Dietician

## 2016-08-20 ENCOUNTER — Telehealth: Payer: Self-pay | Admitting: Nurse Practitioner

## 2016-08-20 NOTE — Telephone Encounter (Signed)
[facility]ar[facility] called asking for another diagnostic code for lab work .Please call [phone] ref: [id number]

## 2016-08-21 NOTE — Telephone Encounter (Signed)
Spoke to [employer] with [facility].  Relayed to use Z79.899 for long tern (current ) use of medications.

## 2017-04-05 DIAGNOSIS — R634 Abnormal weight loss: Secondary | ICD-10-CM | POA: Diagnosis not present

## 2017-04-05 DIAGNOSIS — E43 Unspecified severe protein-calorie malnutrition: Secondary | ICD-10-CM | POA: Diagnosis not present

## 2017-04-05 DIAGNOSIS — R5382 Chronic fatigue, unspecified: Secondary | ICD-10-CM | POA: Diagnosis not present

## 2017-04-05 DIAGNOSIS — R64 Cachexia: Secondary | ICD-10-CM | POA: Diagnosis not present

## 2017-04-05 DIAGNOSIS — Z0000 Encounter for general adult medical examination without abnormal findings: Secondary | ICD-10-CM | POA: Diagnosis not present

## 2017-04-05 DIAGNOSIS — R630 Anorexia: Secondary | ICD-10-CM | POA: Diagnosis not present

## 2017-04-22 DIAGNOSIS — G3184 Mild cognitive impairment, so stated: Secondary | ICD-10-CM | POA: Diagnosis not present

## 2017-04-22 DIAGNOSIS — E46 Unspecified protein-calorie malnutrition: Secondary | ICD-10-CM | POA: Diagnosis not present

## 2017-04-22 DIAGNOSIS — J449 Chronic obstructive pulmonary disease, unspecified: Secondary | ICD-10-CM | POA: Diagnosis not present

## 2017-04-22 DIAGNOSIS — R911 Solitary pulmonary nodule: Secondary | ICD-10-CM | POA: Diagnosis not present

## 2017-04-22 DIAGNOSIS — R439 Unspecified disturbances of smell and taste: Secondary | ICD-10-CM | POA: Diagnosis not present

## 2017-07-19 NOTE — Progress Notes (Signed)
[facility]  PATIENT: Haley Kim DOB: 1946/08/23   REASON FOR VISIT: Follow-up for memory loss HISTORY FROM:patient and husband Bill    HISTORY OF PRESENT ILLNESS:Haley Kim is a 71 years old right-handed female, accompanied by her husband [name], seen in refer by her primary care physician Dr. Leamon Arnt for evaluation of memory loss on May third 2017.  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,  She was noted to have memory loss since 2014, concurrent with her memory loss, she also suffered significant weight loss, lost 40 pounds over past few years, she stayed home most of the time, she used to enjoy playing tennis, she has not play with her friend for 3 months, she stay in bed most of the time, lost appetite, no longer active in her household duty, watching TV when she is awake, she also complains of frequent headaches, bilateral frontal temporal region low-grade pressure pain  Laboratory evaluation in February 2017 showed normal CBC CMP TSH   We have personally reviewed MRI brain scan from [facility] in April 2017, mild generalized atrophy, supratentorium small vessel disease, no acute lesions. She was started on Aricept 5 mg a month ago, was able to tolerate the medications  UPDATE June 15th 2017:YY She is able to tolerate Remeron 7.5 mg every night, which has helped her appetite, she can sleep better, is more motivated, but over the past one month, she was noted to have few episode of nighttime confusion, woke up from sleep looking for her mother, who has passed away few years ago, she has no recollection of the event,  We reviewed laboratory evaluation, normal B12, vitamin D.  Update July 20 2016:YY I have personally reviewed MRI of the brain April 2017 from [facility], no acute process, mild atrophy chronic small vessel disease, She continue have  decreased appetite, stayed in bed most of the time, lack of energy   I also reviewed Dr. Monico Hoar report, there was evidence of mild to moderate impairment requiring and later retrieving new information, in addition, there is evidence of spatial the complex drawing,phonemic fluency were within subnormal range, her confrontational naming, semantic fluency, and executive function fell within the low average range. This represent decline from her estimated baseline level, she was given the diagnosis of mild cognitive impairment, UPDATE  08/21/2018CM   Haley Kim, 71 year old female returns for follow-up with history of mild cognitive impairment. She was on Aricept 5 mg by  primary care,  But is no longer taking that medication. She also has stopped her Remeron. She has lost an additional 4 pounds since last seen by Dr.Yan last year.  She had a referral to a nutritionist however the husband says she did not follow through with recommendations.  She continues to read books and do word searches  For memory stimulation. MRI of the brain in the past showed no acute process, mild atrophy and chronic small  vessel disease. Husband states walking for exercise up until a month ago.  She returns for reevaluation  REVIEW OF SYSTEMS: Full 14 system review of systems performed and notable only for those listed, all others are neg:  Constitutional: neg  Cardiovascular: neg Ear/Nose/Throat: neg  Skin: neg Eyes: neg Respiratory: neg Gastroitestinal: neg  Hematology/Lymphatic: neg  Endocrine: intolerance to cold Musculoskeletal: walking difficulty Allergy/Immunology: neg Neurological:  Memory loss Psychiatric: neg Sleep : neg   ALLERGIES: No Known  Allergies  HOME MEDICATIONS: Outpatient Medications Prior to Visit  Medication Sig Dispense Refill  . Calcium Carbonate (CALCIUM 600 PO) Take 1 tablet by mouth daily.    . Cholecalciferol (VITAMIN D3) 2000 UNITS TABS Take by mouth as directed.    . Coenzyme  Q10 50 MG CAPS Take 120 mg by mouth daily.     Marland Kitchen MAGNESIUM PO Take 400 mg by mouth as directed.     . Multiple Vitamin (MULTIVITAMIN) tablet Take 1 tablet by mouth daily.      . Probiotic Product (PROBIOTIC PO) Take by mouth daily.      . vitamin C (ASCORBIC ACID) 500 MG tablet Take 1,000 mg by mouth daily.     . vitamin E 400 UNIT capsule Take 400 Units by mouth daily.      Marland Kitchen donepezil (ARICEPT) 5 MG tablet Take by mouth.    . dronabinol (MARINOL) 2.5 MG capsule     . mirtazapine (REMERON) 7.5 MG tablet Take 2 tablets (15 mg total) by mouth at bedtime. (Patient not taking: Reported on 07/20/2017) 60 tablet 11   No facility-administered medications prior to visit.     PAST MEDICAL HISTORY: Past Medical History:  Diagnosis Date  . Allergy   . Anxiety   . Chronic headache   . COPD (chronic obstructive pulmonary disease) ([place])   . Disorders of bilirubin excretion   . Loss of weight   . Memory loss   . NSVD (normal spontaneous vaginal delivery)    x 1  . Pure hypercholesterolemia   . 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 Wrist fracture 01/2004   fell on tennis court, muscle problem caused  fall    PAST SURGICAL HISTORY: Past Surgical History:  Procedure Laterality Date  . BREAST LUMPECTOMY     bilateral, age 73  . TUBAL LIGATION      FAMILY HISTORY: Family History  Problem Relation Age of Onset  . Alzheimer's disease Mother   . Alzheimer's disease Father   . Stroke Father        x 5  . Heart disease Father   . Cancer Paternal Aunt        throat  . Cancer Paternal Grandmother        throat  . Diabetes Neg Hx   . Alcohol abuse Neg Hx   . Colon cancer Neg Hx     SOCIAL HISTORY: Social History   Social History  . Marital status: Married    Spouse name: N/A  . Number of children: 1  . Years of education: 2 yrs coll   Occupational History  . Housewife    Social History Main Topics  . Smoking status: Never Smoker  . Smokeless  tobacco: Never Used  . Alcohol use No  . Drug use: No  . Sexual activity: No   Other Topics Concern  . Not on file   Social History Narrative   Desires CPR.   Lives at home with her husband.   Right-handed.   No caffeine use.     PHYSICAL EXAM  Vitals:   07/20/17 1056  BP: 136/90  Pulse: (!) 106  Weight: 72 lb (32.7 kg)  Height: 5' 6.25" (1.683 m)   Body mass index is 11.53 kg/m.  Generalized: Well developed,  Frail female  no acute distress  Head: normocephalic and atraumatic,. Oropharynx benign  Neck: Supple,   Musculoskeletal: No deformity   Neurological examination   Mentation: Alert  MMSE -  Mini Mental State Exam 07/20/2017 07/20/2016 05/14/2016  Orientation to time 1 5 5   Orientation to Place 1 5 4   Registration 3 3 3   Attention/ Calculation 5 5 5   Recall 1 0 0  Language- name 2 objects 2 2 2   Language- repeat 1 1 1   Language- follow 3 step command 3 3 3   Language- read & follow direction 1 1 1   Write a sentence 1 1 1   Copy design 1 1 1   Total score 20 27 26     Follows all commands speech and language fluent. AFT 8. Clock drawing 3/4  Cranial nerve II-XII: Pupils were equal round reactive to light extraocular movements were full, visual field were full on confrontational test. Facial sensation and strength were normal. hearing was intact to finger rubbing bilaterally. Uvula tongue midline. head turning and shoulder shrug were normal and symmetric.Tongue protrusion into cheek strength was normal. Motor: normal bulk and tone, full strength in the BUE, BLE, Sensory: normal and symmetric to light touch, pinprick, and  Vibration, in the upper and lower extremities Coordination: finger-nose-finger, heel-to-shin bilaterally, no dysmetria Reflexes: 1+ upper lower and symmetric, plantar responses were flexor bilaterally. Gait and Station: Rising up from seated position without assistance, normal stance,  moderate stride, good arm swing, smooth turning, able to  perform tiptoe, and heel walking without difficulty. Tandem gait is steady  DIAGNOSTIC DATA (LABS, IMAGING, TESTING) - I reviewed patient records, labs, notes, testing and imaging myself where available.  Lab Results  Component Value Date   WBC 4.4 01/20/2016   HGB 13.0 01/20/2016   HCT 39.3 01/20/2016   MCV 97.8 01/20/2016   PLT 195.0 01/20/2016      Component Value Date/Time   NA 141 01/20/2016 1108   NA 138 03/17/2013 1528   K 4.0 01/20/2016 1108   K 3.6 03/17/2013 1528   CL 102 01/20/2016 1108   CL 103 03/17/2013 1528   CO2 32 01/20/2016 1108   CO2 31 03/17/2013 1528   GLUCOSE 94 01/20/2016 1108   GLUCOSE 126 (H) 03/17/2013 1528   BUN 22 01/20/2016 1108   BUN 13 03/17/2013 1528   CREATININE 0.88 01/20/2016 1108   CREATININE 0.96 03/17/2013 1528   CALCIUM 10.0 01/20/2016 1108   CALCIUM 9.3 03/17/2013 1528   PROT 7.3 01/20/2016 1108   PROT 7.4 03/17/2013 1528   ALBUMIN 4.3 01/20/2016 1108   ALBUMIN 4.0 03/17/2013 1528   AST 20 01/20/2016 1108   AST 24 03/17/2013 1528   ALT 14 01/20/2016 1108   ALT 24 03/17/2013 1528   ALKPHOS 33 (L) 01/20/2016 1108   ALKPHOS 43 (L) 03/17/2013 1528   BILITOT 0.8 01/20/2016 1108   BILITOT 0.5 03/17/2013 1528   GFRNONAA >60 03/17/2013 1528   GFRAA >60 03/17/2013 1528   Lab Results  Component Value Date   CHOL 231 (H) 01/20/2016   HDL 95.40 01/20/2016   LDLCALC 117 (H) 01/20/2016   LDLDIRECT 133.9 11/14/2012   TRIG 93.0 01/20/2016   CHOLHDL 2 01/20/2016   No results found for: HGBA1C Lab Results  Component Value Date   VITAMINB12 1,552 (H) 04/01/2016   Lab Results  Component Value Date   TSH 2.32 01/20/2016      ASSESSMENT AND PLAN  71 y.o. 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 MMSE today 20/30.patient has stopped her medications and her husband refuses to have her placed on others. He is more concerned about her nutritional status at 72 pounds PLAN: To stimulate appetite try wine 1 oz before meals Rx for Megace is quit expensive  continue to read  books and do word search  Follow up yearly Patient refuses research study, patient refuses further medications for memory Dennie Bible, [facility], [facility], [facility] [address], [place], [place] [address] [phone]

## 2017-07-20 ENCOUNTER — Encounter: Payer: Self-pay | Admitting: Nurse Practitioner

## 2017-07-20 ENCOUNTER — Ambulatory Visit (INDEPENDENT_AMBULATORY_CARE_PROVIDER_SITE_OTHER): Payer: Medicare Other | Admitting: Nurse Practitioner

## 2017-07-20 VITALS — BP 136/90 | HR 106 | Ht 66.25 in | Wt 72.0 lb

## 2017-07-20 DIAGNOSIS — G3184 Mild cognitive impairment, so stated: Secondary | ICD-10-CM | POA: Diagnosis not present

## 2017-07-20 DIAGNOSIS — R413 Other amnesia: Secondary | ICD-10-CM

## 2017-07-20 NOTE — Patient Instructions (Signed)
To stimulate appetite try a few ounces of wine Rx for Megace is quit expensive  continue to read  books and do word search  Follow up yearly

## 2017-07-21 NOTE — Progress Notes (Signed)
I have reviewed and agreed above plan. 

## 2017-09-23 DIAGNOSIS — F0390 Unspecified dementia without behavioral disturbance: Secondary | ICD-10-CM | POA: Diagnosis not present

## 2017-09-23 DIAGNOSIS — R808 Other proteinuria: Secondary | ICD-10-CM | POA: Diagnosis not present

## 2017-09-23 DIAGNOSIS — E441 Mild protein-calorie malnutrition: Secondary | ICD-10-CM | POA: Diagnosis not present

## 2017-09-23 DIAGNOSIS — Z1159 Encounter for screening for other viral diseases: Secondary | ICD-10-CM | POA: Diagnosis not present

## 2017-09-23 DIAGNOSIS — F419 Anxiety disorder, unspecified: Secondary | ICD-10-CM | POA: Diagnosis not present

## 2017-10-04 DIAGNOSIS — D472 Monoclonal gammopathy: Secondary | ICD-10-CM | POA: Diagnosis not present

## 2017-10-19 DIAGNOSIS — E441 Mild protein-calorie malnutrition: Secondary | ICD-10-CM | POA: Diagnosis not present

## 2017-10-19 DIAGNOSIS — J479 Bronchiectasis, uncomplicated: Secondary | ICD-10-CM | POA: Diagnosis not present

## 2017-10-19 DIAGNOSIS — Z23 Encounter for immunization: Secondary | ICD-10-CM | POA: Diagnosis not present

## 2017-10-19 DIAGNOSIS — F0390 Unspecified dementia without behavioral disturbance: Secondary | ICD-10-CM | POA: Diagnosis not present

## 2017-10-19 DIAGNOSIS — D472 Monoclonal gammopathy: Secondary | ICD-10-CM | POA: Diagnosis not present

## 2017-10-19 DIAGNOSIS — E7800 Pure hypercholesterolemia, unspecified: Secondary | ICD-10-CM | POA: Diagnosis not present

## 2017-10-25 DIAGNOSIS — F0280 Dementia in other diseases classified elsewhere without behavioral disturbance: Secondary | ICD-10-CM | POA: Diagnosis not present

## 2017-10-25 DIAGNOSIS — R413 Other amnesia: Secondary | ICD-10-CM | POA: Diagnosis not present

## 2017-10-25 DIAGNOSIS — G301 Alzheimer's disease with late onset: Secondary | ICD-10-CM | POA: Diagnosis not present

## 2018-02-23 ENCOUNTER — Telehealth: Payer: Self-pay | Admitting: Nurse Practitioner

## 2018-02-23 ENCOUNTER — Encounter: Payer: Self-pay | Admitting: Nurse Practitioner

## 2018-02-23 NOTE — Telephone Encounter (Signed)
Spoke w/ patient's husband on 3/27 about rescheduling 07/21/18 appt w/ Cecille Rubin. Husband stated he would cancel the appt for now and call back when ready to r/s after speaking with his wife. Cancellation letter sent

## 2018-04-27 ENCOUNTER — Other Ambulatory Visit (HOSPITAL_COMMUNITY): Payer: Self-pay | Admitting: Nurse Practitioner

## 2018-04-27 ENCOUNTER — Other Ambulatory Visit: Payer: Self-pay | Admitting: Nurse Practitioner

## 2018-04-27 DIAGNOSIS — G301 Alzheimer's disease with late onset: Principal | ICD-10-CM

## 2018-04-27 DIAGNOSIS — F0280 Dementia in other diseases classified elsewhere without behavioral disturbance: Secondary | ICD-10-CM

## 2018-05-04 ENCOUNTER — Other Ambulatory Visit: Payer: Self-pay | Admitting: Internal Medicine

## 2018-05-04 DIAGNOSIS — R9389 Abnormal findings on diagnostic imaging of other specified body structures: Secondary | ICD-10-CM

## 2018-05-04 DIAGNOSIS — N200 Calculus of kidney: Secondary | ICD-10-CM

## 2018-05-06 ENCOUNTER — Ambulatory Visit (HOSPITAL_COMMUNITY)
Admission: RE | Admit: 2018-05-06 | Discharge: 2018-05-06 | Disposition: A | Discharge status: Home / Self Care | Payer: Medicare Other | Source: Ambulatory Visit | Attending: Nurse Practitioner | Admitting: Nurse Practitioner

## 2018-05-06 DIAGNOSIS — R413 Other amnesia: Secondary | ICD-10-CM | POA: Insufficient documentation

## 2018-05-06 DIAGNOSIS — G301 Alzheimer's disease with late onset: Secondary | ICD-10-CM | POA: Insufficient documentation

## 2018-05-06 DIAGNOSIS — F0280 Dementia in other diseases classified elsewhere without behavioral disturbance: Secondary | ICD-10-CM | POA: Diagnosis present

## 2018-05-13 ENCOUNTER — Ambulatory Visit
Admission: RE | Admit: 2018-05-13 | Discharge: 2018-05-13 | Disposition: A | Discharge status: Home / Self Care | Payer: Medicare Other | Source: Ambulatory Visit | Attending: Internal Medicine | Admitting: Internal Medicine

## 2018-05-13 DIAGNOSIS — N200 Calculus of kidney: Secondary | ICD-10-CM | POA: Diagnosis present

## 2018-05-13 DIAGNOSIS — M47896 Other spondylosis, lumbar region: Secondary | ICD-10-CM | POA: Insufficient documentation

## 2018-05-13 DIAGNOSIS — R9389 Abnormal findings on diagnostic imaging of other specified body structures: Secondary | ICD-10-CM

## 2018-05-13 DIAGNOSIS — N858 Other specified noninflammatory disorders of uterus: Secondary | ICD-10-CM | POA: Insufficient documentation

## 2018-05-13 DIAGNOSIS — I700 Atherosclerosis of aorta: Secondary | ICD-10-CM | POA: Insufficient documentation

## 2018-07-12 ENCOUNTER — Other Ambulatory Visit: Payer: Self-pay

## 2018-07-12 ENCOUNTER — Encounter
Admission: RE | Admit: 2018-07-12 | Discharge: 2018-07-12 | Disposition: A | Discharge status: Home / Self Care | Payer: Medicare Other | Source: Ambulatory Visit | Attending: Obstetrics and Gynecology | Admitting: Obstetrics and Gynecology

## 2018-07-12 DIAGNOSIS — Z01812 Encounter for preprocedural laboratory examination: Secondary | ICD-10-CM | POA: Diagnosis present

## 2018-07-12 HISTORY — DX: Abnormal weight loss: R63.4

## 2018-07-12 HISTORY — DX: Chronic cough: R05.3

## 2018-07-12 HISTORY — DX: Anorexia: R63.0

## 2018-07-12 HISTORY — DX: Cough: R05

## 2018-07-12 HISTORY — DX: Dehydration: E86.0

## 2018-07-12 LAB — BASIC METABOLIC PANEL
ANION GAP: 6 (ref 5–15)
BUN: 16 mg/dL (ref 8–23)
CO2: 32 mmol/L (ref 22–32)
Calcium: 9.7 mg/dL (ref 8.9–10.3)
Chloride: 105 mmol/L (ref 98–111)
Creatinine, Ser: 0.96 mg/dL (ref 0.44–1.00)
GFR, EST NON AFRICAN AMERICAN: 57 mL/min — AB (ref >60)
GLUCOSE: 101 mg/dL — AB (ref 70–99)
POTASSIUM: 4.6 mmol/L (ref 3.5–5.1)
Sodium: 143 mmol/L (ref 135–145)

## 2018-07-12 LAB — TYPE AND SCREEN
ABO/RH(D): A NEG
ANTIBODY SCREEN: NEGATIVE

## 2018-07-12 LAB — CBC
HEMATOCRIT: 37.7 % (ref 35.0–47.0)
HEMOGLOBIN: 12.9 g/dL (ref 12.0–16.0)
MCH: 35.1 pg — ABNORMAL HIGH (ref 26.0–34.0)
MCHC: 34.3 g/dL (ref 32.0–36.0)
MCV: 102.3 fL — ABNORMAL HIGH (ref 80.0–100.0)
Platelets: 203 10*3/uL (ref 150–440)
RBC: 3.69 MIL/uL — AB (ref 3.80–5.20)
RDW: 14.5 % (ref 11.5–14.5)
WBC: 5.6 10*3/uL (ref 3.6–11.0)

## 2018-07-12 NOTE — H&P (Signed)
Haley Kim is a 72 y.o. female here for Fractional D+C and possible myosure resection  . Consult from Dr Caryl Comes for CT scan finding of uterine mass  41mm in size noted 05/13/18 while work up for back pain  Pt denies no pelvic pain and no vaginal bleeding .    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  Past Surgical History:  has a past surgical history that includes Cataract extraction; Tubal ligation; and breast lump remocal (Right). Family History: family history includes Alzheimer's disease in her mother; Dementia in her father; Stroke in her father. Social History:  reports that she has never smoked. She has never used smokeless tobacco. She reports that she drank alcohol. She reports that she has current or past drug history. OB/GYN History:          OB History    Gravida  1   Para  1   Term      Preterm      AB      Living  1     SAB      TAB      Ectopic      Molar      Multiple      Live Births  1          Allergies: is allergic to donepezil; dronabinol; and mirtazapine. Medications:  Current Outpatient Medications:  .  acetylcysteine (NAC ORAL), Take 1,000 mg by mouth, Disp: , Rfl:  .  ascorbic acid (VITA-C ORAL), Take by mouth, Disp: , Rfl:  .  cholecalciferol, vitamin D3, (VITAMIN D3) 5,000 unit Tab, Take by mouth, Disp: , Rfl:  .  cinnamon bark (CINNAMON ORAL), Take 500 mg by mouth, Disp: , Rfl:  .  co-enzyme Q-10, ubiquinone, 200 mg capsule, Take 200 mg by mouth once daily, Disp: , Rfl:  .  GARLIC ORAL, Take 75 mg by  mouth, Disp: , Rfl:  .  ginger, Zingiber officinalis, (GINGER EXTRACT) 250 mg Cap, Take 500 mg by mouth, Disp: , Rfl:  .  L.acid/L.casei/B.bif/B.lon/FOS (PROBIOTIC BLEND ORAL), Take by mouth, Disp: , Rfl:  .  magnesium oxide 400 mg, Take 400 mg by mouth, Disp: , Rfl:  .  megestrol (MEGACE) 400 mg/10 mL (40 mg/mL) suspension, Take 1 mL (40 mg total) by mouth once daily (Patient taking differently: Take 80 mg by mouth once daily  ), Disp: 90 mL, Rfl: 3 .  multivitamin tablet, Take 1 tablet by mouth once daily, Disp: , Rfl:  .  omega-3/dha/epa/fish oil (OMEGA-3 ORAL), Take by mouth, Disp: , Rfl:  .  resveratrol 250 mg Cap, Take 250 mg by mouth, Disp: , Rfl:  .  turmeric/turmeric ext/pepr ext (TURMERIC-TURMERIC EXT-PEPPER ORAL), Take 1.8 mg by mouth, Disp: , Rfl:  .  UNABLE TO FIND, PQQ TAKE 1 20 MG TAB DAILY, Disp: , Rfl:  .  vitamin B complex (VITAMINS B COMPLEX ORAL), Take by mouth, Disp: , Rfl:  .  vitamin E acetate (VITAMIN E ORAL), Take 400 Units by mouth, Disp: , Rfl:  .  VITAMIN K2 ORAL, Take by mouth, Disp: , Rfl:  .  ZINC ORAL, Take 5 mg by mouth, Disp: , Rfl:   Review of Systems: General:  No fatigue or weight loss Eyes:                           No vision changes Ears:                            No hearing difficulty Respiratory:                No cough or shortness of breath Pulmonary:                  No asthma or shortness of breath Cardiovascular:           No chest pain, palpitations, dyspnea on exertion Gastrointestinal:          No abdominal bloating, chronic diarrhea, constipations, masses, pain or hematochezia Genitourinary:             No hematuria, dysuria, abnormal vaginal discharge, pelvic pain, Menometrorrhagia Lymphatic:                   No swollen lymph nodes Musculoskeletal:         No muscle weakness Neurologic:                  No extremity weakness, syncope, seizure disorder Psychiatric:                  No history of depression, delusions  or suicidal/homicidal ideation    Exam:   Vitals:   06/1318 1442  BP: 105/85  Pulse: 71    Body mass index is 12.15 kg/m.  WDWN white/ female in NAD   Lungs: CTA  CV : RRR without murmur    Neck:  no thyromegaly Abdomen: soft , no mass, normal active bowel sounds,  non-tender, no rebound tenderness Pelvic: tanner stage 5 ,  External genitalia: vulva /labia no lesions Urethra: no prolapse Vagina: normal physiologic d/c Cervix: no lesions, no cervical motion tenderness   Uterus: normal size shape and contour, non-tender Adnexa: no mass,  non-tender   Rectovaginal: no mass heme negative   U/s:Uterus retroverted  Complex fluid filled endometrium=19.34mm; walls=0.11 + 0.13cm=0.24cm (2.10mm); solid component within endometrial lining=2.31 x 1.77 x 3.00cm  No free fluid seen  Lt ovary solid cyst=0.73 x 0.56 x 0.71cm  Rt ovary appears wnl  Impression:   The encounter diagnosis was Uterine mass.  Exam normal Endometrial findings could be old blood vs fibroid vs endometrial cancer .   Plan:  Recommend Fractional D+C and H/S , possible myosure if solid mass identified.  RTC for pre op  Pt can currently consent for her procedure        Caroline Sauger, MD

## 2018-07-12 NOTE — Pre-Procedure Instructions (Signed)
[facility] [place] REQUESTED BY DR B Caryl Comes

## 2018-07-12 NOTE — Patient Instructions (Signed)
Your procedure is scheduled on: 07/18/18 Mon Report to Same Day Surgery 2nd floor medical mall [facility] Entrance-take elevator on left to 2nd floor.  Check in with surgery information desk.) To find out your arrival time please call [phone] between 1PM - 3PM on  07/15/18 Fri  Remember: Instructions that are not followed completely may result in serious medical risk, up to and including death, or upon the discretion of your surgeon and anesthesiologist your surgery may need to be rescheduled.    _x___ 1. Do not eat food after midnight the night before your procedure. You may drink clear liquids up to 2 hours before you are scheduled to arrive at the hospital for your procedure.  Do not drink clear liquids within 2 hours of your scheduled arrival to the hospital.  Clear liquids include  --Water or Apple juice without pulp  --Clear carbohydrate beverage such as ClearFast or Gatorade  --Black Coffee or Clear Tea (No milk, no creamers, do not add anything to                  the coffee or Tea Type 1 and type 2 diabetics should only drink water.   ____Ensure clear carbohydrate drink on the way to the hospital for bariatric patients  ____Ensure clear carbohydrate drink 3 hours before surgery for Dr Dwyane Luo patients if physician instructed.   No gum chewing or hard candies.     __x__ 2. No Alcohol for 24 hours before or after surgery.   __x__3. No Smoking or e-cigarettes for 24 prior to surgery.  Do not use any chewable tobacco products for at least 6 hour prior to surgery   ____  4. Bring all medications with you on the day of surgery if instructed.    __x__ 5. Notify your doctor if there is any change in your medical condition     (cold, fever, infections).    x___6. On the morning of surgery brush your teeth with toothpaste and water.  You may rinse your mouth with mouth wash if you wish.  Do not swallow any toothpaste or mouthwash.   Do not wear jewelry, make-up, hairpins,  clips or nail polish.  Do not wear lotions, powders, or perfumes. You may wear deodorant.  Do not shave 48 hours prior to surgery. Men may shave face and neck.  Do not bring valuables to the hospital.    [facility] is not responsible for any belongings or valuables.               Contacts, dentures or bridgework may not be worn into surgery.  Leave your suitcase in the car. After surgery it may be brought to your room.  For patients admitted to the hospital, discharge time is determined by your                       treatment team.  _  Patients discharged the day of surgery will not be allowed to drive home.  You will need someone to drive you home and stay with you the night of your procedure.    Please read over the following fact sheets that you were given:   [facility] Preparing for Surgery and or MRSA Information   _x___ Take anti-hypertensive listed below, cardiac, seizure, asthma,     anti-reflux and psychiatric medicines. These include:  1. None  2.  3.  4.  5.  6.  ____Fleets enema or Magnesium Citrate as  directed.   _x___ Use CHG Soap or sage wipes as directed on instruction sheet   ____ Use inhalers on the day of surgery and bring to hospital day of surgery  ____ Stop Metformin and Janumet 2 days prior to surgery.    ____ Take 1/2 of usual insulin dose the night before surgery and none on the morning     surgery.   _x___ Follow recommendations from Cardiologist, Pulmonologist or PCP regarding          stopping Aspirin, Coumadin, Plavix ,Eliquis, Effient, or Pradaxa, and Pletal.  X____Stop Anti-inflammatories such as Advil, Aleve, Ibuprofen, Motrin, Naproxen, Naprosyn, Goodies powders or aspirin products. OK to take Tylenol and                          Celebrex.   _x___ Stop supplements until after surgery.  But may continue Vitamin D, Vitamin B,       and multivitamin.   ____ Bring C-Pap to the hospital.

## 2018-07-12 NOTE — Pre-Procedure Instructions (Signed)
AS INSTRUCTED BY DR Gildardo Griffes, REQUEST FOR MEDICAL CLEARANCE CALLED AND FAXED TO DR B Caryl Comes. ALSO CALLED AND FAXED TO DR Ouida Sills. FAILURE TO THRIVE. SPOKE [place]

## 2018-07-18 ENCOUNTER — Other Ambulatory Visit: Payer: Self-pay

## 2018-07-18 ENCOUNTER — Encounter: Payer: Self-pay | Admitting: Anesthesiology

## 2018-07-18 ENCOUNTER — Encounter
Admission: RE | Disposition: A | Discharge status: Home / Self Care | Payer: Self-pay | Source: Ambulatory Visit | Attending: Obstetrics and Gynecology

## 2018-07-18 ENCOUNTER — Ambulatory Visit: Payer: Medicare Other | Admitting: Anesthesiology

## 2018-07-18 ENCOUNTER — Ambulatory Visit
Admission: RE | Admit: 2018-07-18 | Discharge: 2018-07-18 | Disposition: A | Discharge status: Home / Self Care | Payer: Medicare Other | Source: Ambulatory Visit | Attending: Obstetrics and Gynecology | Admitting: Obstetrics and Gynecology

## 2018-07-18 DIAGNOSIS — F0390 Unspecified dementia without behavioral disturbance: Secondary | ICD-10-CM | POA: Diagnosis not present

## 2018-07-18 DIAGNOSIS — F419 Anxiety disorder, unspecified: Secondary | ICD-10-CM | POA: Diagnosis not present

## 2018-07-18 DIAGNOSIS — Z79899 Other long term (current) drug therapy: Secondary | ICD-10-CM | POA: Diagnosis not present

## 2018-07-18 DIAGNOSIS — K219 Gastro-esophageal reflux disease without esophagitis: Secondary | ICD-10-CM | POA: Insufficient documentation

## 2018-07-18 DIAGNOSIS — N858 Other specified noninflammatory disorders of uterus: Secondary | ICD-10-CM | POA: Diagnosis present

## 2018-07-18 DIAGNOSIS — F329 Major depressive disorder, single episode, unspecified: Secondary | ICD-10-CM | POA: Diagnosis not present

## 2018-07-18 DIAGNOSIS — E7800 Pure hypercholesterolemia, unspecified: Secondary | ICD-10-CM | POA: Insufficient documentation

## 2018-07-18 DIAGNOSIS — J449 Chronic obstructive pulmonary disease, unspecified: Secondary | ICD-10-CM | POA: Diagnosis not present

## 2018-07-18 DIAGNOSIS — D472 Monoclonal gammopathy: Secondary | ICD-10-CM | POA: Insufficient documentation

## 2018-07-18 HISTORY — PX: DILATATION & CURETTAGE/HYSTEROSCOPY WITH MYOSURE: SHX6511

## 2018-07-18 HISTORY — PX: HYSTEROSCOPY W/D&C: SHX1775

## 2018-07-18 LAB — ABO/RH: ABO/RH(D): A NEG

## 2018-07-18 SURGERY — DILATATION AND CURETTAGE /HYSTEROSCOPY
Anesthesia: General

## 2018-07-18 MED ORDER — OXYCODONE HCL 5 MG/5ML PO SOLN: 5.0000 mg | Freq: Once | ORAL | Status: DC | PRN

## 2018-07-18 MED ORDER — EPHEDRINE SULFATE 50 MG/ML IJ SOLN
INTRAMUSCULAR | Status: DC | PRN
  Administered 2018-07-18: 10 mg via INTRAVENOUS
  Administered 2018-07-18: 5 mg via INTRAVENOUS

## 2018-07-18 MED ORDER — FAMOTIDINE 20 MG PO TABS
ORAL_TABLET | ORAL | Status: AC
  Administered 2018-07-18: 20 mg via ORAL
  Filled 2018-07-18: qty 1

## 2018-07-18 MED ORDER — ONDANSETRON HCL 4 MG/2ML IJ SOLN
INTRAMUSCULAR | Status: DC | PRN
  Administered 2018-07-18: 4 mg via INTRAVENOUS

## 2018-07-18 MED ORDER — LACTATED RINGERS IV SOLN: INTRAVENOUS | Status: DC

## 2018-07-18 MED ORDER — FAMOTIDINE 20 MG PO TABS
20.0000 mg | ORAL_TABLET | Freq: Once | ORAL | Status: AC
  Administered 2018-07-18: 20 mg via ORAL

## 2018-07-18 MED ORDER — DEXAMETHASONE SODIUM PHOSPHATE 10 MG/ML IJ SOLN
INTRAMUSCULAR | Status: DC | PRN
  Administered 2018-07-18: 5 mg via INTRAVENOUS

## 2018-07-18 MED ORDER — FENTANYL CITRATE (PF) 100 MCG/2ML IJ SOLN
INTRAMUSCULAR | Status: DC | PRN
  Administered 2018-07-18 (×4): 25 ug via INTRAVENOUS

## 2018-07-18 MED ORDER — FENTANYL CITRATE (PF) 100 MCG/2ML IJ SOLN
INTRAMUSCULAR | Status: AC
  Filled 2018-07-18: qty 2

## 2018-07-18 MED ORDER — CEFAZOLIN SODIUM-DEXTROSE 2-4 GM/100ML-% IV SOLN
2.0000 g | Freq: Once | INTRAVENOUS | Status: AC
  Administered 2018-07-18: 2 g via INTRAVENOUS

## 2018-07-18 MED ORDER — OXYCODONE HCL 5 MG PO TABS: 5.0000 mg | ORAL_TABLET | Freq: Once | ORAL | Status: DC | PRN

## 2018-07-18 MED ORDER — FENTANYL CITRATE (PF) 100 MCG/2ML IJ SOLN: 25.0000 ug | INTRAMUSCULAR | Status: DC | PRN

## 2018-07-18 MED ORDER — LACTATED RINGERS IV SOLN
INTRAVENOUS | Status: DC
  Administered 2018-07-18: 14:00:00 via INTRAVENOUS

## 2018-07-18 MED ORDER — PROPOFOL 10 MG/ML IV BOLUS
INTRAVENOUS | Status: DC | PRN
  Administered 2018-07-18: 50 mg via INTRAVENOUS
  Administered 2018-07-18: 20 mg via INTRAVENOUS

## 2018-07-18 MED ORDER — CEFAZOLIN SODIUM-DEXTROSE 2-4 GM/100ML-% IV SOLN
INTRAVENOUS | Status: AC
  Filled 2018-07-18: qty 100

## 2018-07-18 SURGICAL SUPPLY — 21 items
CANISTER SUC SOCK COL 7IN (MISCELLANEOUS) ×3 IMPLANT
CANISTER SUCT 3000ML PPV (MISCELLANEOUS) ×3 IMPLANT
CATH ROBINSON RED A/P 16FR (CATHETERS) ×3 IMPLANT
DEVICE MYOSURE LITE (MISCELLANEOUS) IMPLANT
DEVICE MYOSURE REACH (MISCELLANEOUS) IMPLANT
GLOVE BIO SURGEON STRL SZ8 (GLOVE) ×12 IMPLANT
GOWN STRL REUS W/ TWL LRG LVL3 (GOWN DISPOSABLE) ×1 IMPLANT
GOWN STRL REUS W/ TWL XL LVL3 (GOWN DISPOSABLE) ×1 IMPLANT
GOWN STRL REUS W/TWL LRG LVL3 (GOWN DISPOSABLE) ×2
GOWN STRL REUS W/TWL XL LVL3 (GOWN DISPOSABLE) ×2
IV LACTATED RINGERS 1000ML (IV SOLUTION) ×3 IMPLANT
KIT TURNOVER CYSTO (KITS) ×3 IMPLANT
PACK DNC HYST (MISCELLANEOUS) ×3 IMPLANT
PAD OB MATERNITY 4.3X12.25 (PERSONAL CARE ITEMS) ×3 IMPLANT
PAD PREP 24X41 OB/GYN DISP (PERSONAL CARE ITEMS) ×3 IMPLANT
SOL .9 NS 3000ML IRR  AL (IV SOLUTION) ×2
SOL .9 NS 3000ML IRR UROMATIC (IV SOLUTION) ×1 IMPLANT
TOWEL OR 17X26 4PK STRL BLUE (TOWEL DISPOSABLE) ×3 IMPLANT
TUBING CONNECTING 10 (TUBING) ×2 IMPLANT
TUBING CONNECTING 10' (TUBING) ×1
TUBING HYSTEROSCOPY DOLPHIN (MISCELLANEOUS) ×3 IMPLANT

## 2018-07-18 NOTE — Brief Op Note (Signed)
07/18/2018  2:46 PM  PATIENT:  Jacolyn Reedy  72 y.o. female  PRE-OPERATIVE DIAGNOSIS:  endometrial mass  POST-OPERATIVE DIAGNOSIS: atrophic endometrium  PROCEDURE:   Fractional dilation and curettage  Hysteroscopy   SURGEON:  Surgeon(s) and Role:    * Schermerhorn, Gwen Her, MD - Primary  PHYSICIAN ASSISTANT: none  ASSISTANTS: none   ANESTHESIA:   general  EBL:  Minimal . IOF 1000 cc UO 25 cc   BLOOD ADMINISTERED:none  DRAINS: none   LOCAL MEDICATIONS USED:  NONE  SPECIMEN:  Source of Specimen:  endocervical curettings, endometrial curettings   DISPOSITION OF SPECIMEN:  PATHOLOGY  COUNTS:  YES  TOURNIQUET:  * No tourniquets in log *  DICTATION: .Other Dictation: Dictation Number verbal  PLAN OF CARE: verbal   PATIENT DISPOSITION:  PACU - hemodynamically stable.   Delay start of Pharmacological VTE agent (>24hrs) due to surgical blood loss or risk of bleeding: not applicable

## 2018-07-18 NOTE — Progress Notes (Signed)
Pt ready for Fractional D+C and myosure resection of endometrial mass All questions answered . Labs reviewed

## 2018-07-18 NOTE — Anesthesia Post-op Follow-up Note (Signed)
Anesthesia QCDR form completed.        

## 2018-07-18 NOTE — Anesthesia Preprocedure Evaluation (Addendum)
Anesthesia Evaluation  Patient identified by MRN, date of birth, ID band Patient awake and Patient confused    Reviewed: Allergy & Precautions, H&P , NPO status , Patient's Chart, lab work & pertinent test results  History of Anesthesia Complications Negative for: history of anesthetic complications  Airway Mallampati: III  TM Distance: <3 FB Neck ROM: limited    Dental  (+) Chipped, Poor Dentition, Missing   Pulmonary COPD,           Cardiovascular Exercise Tolerance: Good (-) angina+ Peripheral Vascular Disease  (-) Past MI and (-) PND negative cardio ROS       Neuro/Psych  Headaches, PSYCHIATRIC DISORDERS Anxiety Depression    GI/Hepatic negative GI ROS, Neg liver ROS, neg GERD  ,  Endo/Other  negative endocrine ROS  Renal/GU      Musculoskeletal   Abdominal   Peds  Hematology negative hematology ROS (+)   Anesthesia Other Findings Past Medical History: No date: Allergy No date: Anxiety No date: Chronic cough No date: Chronic headache No date: COPD (chronic obstructive pulmonary disease) (HCC) No date: Dehydration No date: Disorders of bilirubin excretion No date: Loss of appetite No date: Loss of weight No date: Memory loss No date: NSVD (normal spontaneous vaginal delivery)     Comment:  x 1 No date: Pure hypercholesterolemia No date: 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 No date: Weight loss 01/2004: Wrist fracture     Comment:  fell on tennis court, muscle problem caused  fall  Past Surgical History: No date: BREAST LUMPECTOMY     Comment:  bilateral, age 67 No date: TUBAL LIGATION  BMI    Body Mass Index:  12.36 kg/m      Reproductive/Obstetrics negative OB ROS                             Anesthesia Physical Anesthesia Plan  ASA: III  Anesthesia Plan: General LMA   Post-op Pain Management:    Induction: Intravenous  PONV Risk  Score and Plan: Ondansetron, Dexamethasone and Treatment may vary due to age or medical condition  Airway Management Planned: LMA  Additional Equipment:   Intra-op Plan:   Post-operative Plan: Extubation in OR  Informed Consent: I have reviewed the patients History and Physical, chart, labs and discussed the procedure including the risks, benefits and alternatives for the proposed anesthesia with the patient or authorized representative who has indicated his/her understanding and acceptance.   Dental Advisory Given  Plan Discussed with: Anesthesiologist, CRNA and Surgeon  Anesthesia Plan Comments: ( Patient and husband consented for risks of anesthesia including but not limited to:  - adverse reactions to medications - damage to teeth, lips or other oral mucosa - sore throat or hoarseness - Damage to heart, brain, lungs or loss of life  They voiced understanding.)       Anesthesia Quick Evaluation

## 2018-07-18 NOTE — Transfer of Care (Signed)
Immediate Anesthesia Transfer of Care Note  Patient: Haley Kim  Procedure(s) Performed: DILATATION AND CURETTAGE /HYSTEROSCOPY (N/A ) DILATATION & CURETTAGE/HYSTEROSCOPY WITH MYOSURE (N/A )  Patient Location: PACU  Anesthesia Type:General  Level of Consciousness: sedated  Airway & Oxygen Therapy: Patient connected to face mask oxygen  Post-op Assessment: Post -op Vital signs reviewed and stable  Post vital signs: stable  Last Vitals:  Vitals Value Taken Time  BP 125/67 07/18/2018  2:56 PM  Temp    Pulse 71 07/18/2018  2:56 PM  Resp 20 07/18/2018  2:56 PM  SpO2 100 % 07/18/2018  2:56 PM    Last Pain:  Vitals:   07/18/18 1142  TempSrc: Temporal  PainSc: 0-No pain         Complications: No apparent anesthesia complications

## 2018-07-18 NOTE — Discharge Instructions (Addendum)
Dilation and Curettage or Vacuum Curettage, Care After This sheet gives you information about how to care for yourself after your procedure. Your health care provider may also give you more specific instructions. If you have problems or questions, contact your health care provider. What can I expect after the procedure? After your procedure, it is common to have:  Mild pain or cramping.  Some vaginal bleeding or spotting.  These may last for up to 2 weeks after your procedure. Follow these instructions at home: Activity   Do not drive or use heavy machinery while taking prescription pain medicine.  Avoid driving for the first 24 hours after your procedure.  Take frequent, short walks, followed by rest periods, throughout the day. Ask your health care provider what activities are safe for you. After 1-2 days, you may be able to return to your normal activities.  Do not lift anything heavier than 10 lb (4.5 kg) until your health care provider approves.  For at least 2 weeks, or as long as told by your health care provider, do not: ? Douche. ? Use tampons. ? Have sexual intercourse. General instructions   Take over-the-counter and prescription medicines only as told by your health care provider. This is especially important if you take blood thinning medicine.  Do not take baths, swim, or use a hot tub until your health care provider approves. Take showers instead of baths.  Wear compression stockings as told by your health care provider. These stockings help to prevent blood clots and reduce swelling in your legs.  It is your responsibility to get the results of your procedure. Ask your health care provider, or the department performing the procedure, when your results will be ready.  Keep all follow-up visits as told by your health care provider. This is important. Contact a health care provider if:  You have severe cramps that get worse or that do not get better with  medicine.  You have severe abdominal pain.  You cannot drink fluids without vomiting.  You develop pain in a different area of your pelvis.  You have bad-smelling vaginal discharge.  You have a rash. Get help right away if:  You have vaginal bleeding that soaks more than one sanitary pad in 1 hour, for 2 hours in a row.  You pass large blood clots from your vagina.  You have a fever that is above 100.72F (38.0C).  Your abdomen feels very tender or hard.  You have chest pain.  You have shortness of breath.  You cough up blood.  You feel dizzy or light-headed.  You faint.  You have pain in your neck or shoulder area. This information is not intended to replace advice given to you by your health care provider. Make sure you discuss any questions you have with your health care provider. Document Released: 11/13/2000 Document Revised: 07/15/2016 Document Reviewed: 06/18/2016 Elsevier Interactive Patient Education  2018 [place]   1) The drugs that you were given will stay in your system until tomorrow so for the next 24 hours you should not:  A) Drive an automobile B) Make any legal decisions C) Drink any alcoholic beverage   2) You may resume regular meals tomorrow.  Today it is better to start with liquids and gradually work up to solid foods.  You may eat anything you prefer, but it is better to start with liquids, then soup and crackers, and gradually work up to solid foods.  3) Please notify your doctor immediately if you have any unusual bleeding, trouble breathing, redness and pain at the surgery site, drainage, fever, or pain not relieved by medication.    4) Additional Instructions:   Please contact your physician with any problems or Same Day Surgery at [phone], Monday through Friday 6 am to 4 pm, or [place] at [facility] number at [phone].

## 2018-07-18 NOTE — Anesthesia Procedure Notes (Signed)
Procedure Name: LMA Insertion Performed by: Aline Brochure, CRNA Pre-anesthesia Checklist: Patient identified, Patient being monitored, Timeout performed, Emergency Drugs available and Suction available Patient Re-evaluated:Patient Re-evaluated prior to induction Oxygen Delivery Method: Circle system utilized Preoxygenation: Pre-oxygenation with 100% oxygen Induction Type: IV induction Ventilation: Mask ventilation without difficulty LMA: LMA inserted LMA Size: 3.5 Tube type: Oral Number of attempts: 1 Placement Confirmation: positive ETCO2 and breath sounds checked- equal and bilateral Tube secured with: Tape Dental Injury: Teeth and Oropharynx as per pre-operative assessment

## 2018-07-19 NOTE — Anesthesia Postprocedure Evaluation (Signed)
Anesthesia Post Note  Patient: Haley Kim  Procedure(s) Performed: DILATATION AND CURETTAGE /HYSTEROSCOPY (N/A ) DILATATION & CURETTAGE/HYSTEROSCOPY WITH MYOSURE (N/A )  Patient location during evaluation: PACU Anesthesia Type: General Level of consciousness: awake and alert Pain management: pain level controlled Vital Signs Assessment: post-procedure vital signs reviewed and stable Respiratory status: spontaneous breathing, nonlabored ventilation, respiratory function stable and patient connected to nasal cannula oxygen Cardiovascular status: blood pressure returned to baseline and stable Postop Assessment: no apparent nausea or vomiting Anesthetic complications: no     Last Vitals:  Vitals:   07/18/18 1543 07/18/18 1618  BP: (!) 152/38 (P) 138/85  Pulse: 74 (!) (P) 104  Resp: 20 (P) 16  Temp: 36.6 C   SpO2: 99% (P) 96%    Last Pain:  Vitals:   07/18/18 1543  TempSrc: Temporal  PainSc: 2                  Precious Haws Piscitello

## 2018-07-19 NOTE — Op Note (Signed)
NAME: Haley Kim, BERTINI MEDICAL RECORD [id number] ACCOUNT [id number] DATE OF BIRTH:12/30/1945 FACILITY: [facility] LOCATION: [facility] PHYSICIAN:Loghan Subia Josefine Class, MD  OPERATIVE REPORT  DATE OF PROCEDURE:  07/18/2018  PREOPERATIVE DIAGNOSES:   1.  Thickened endometrial lining. 1.  Endometrial mass.  POSTOPERATIVE DIAGNOSIS:  Atrophic endometrium.  PROCEDURE:   1.  Fractional dilation and curettage. 2.  Hysteroscopy.  SURGEON:   Boykin Nearing, MD   ANESTHESIA:  General endotracheal anesthesia.  INDICATIONS:  A 72 year old female was noted to have on CT a 19 mm endometrial mass.  Vaginal ultrasound showed complex fluid-filled endometrium at 19 mm and a solid component in the endometrial lining measuring 2.3 x 1.7 cm x 3.0 cm.  DESCRIPTION OF PROCEDURE:  After adequate general endotracheal anesthesia, the patient was placed in dorsal supine position, legs in the [place] stirrups.  The patient's lower abdomen, perineum and vagina were prepped and draped in normal sterile fashion.   The patient did receive 2 grams IV Ancef prior to commencement of the case.  Timeout was performed.  A weighted speculum was placed in the posterior vaginal vault and the anterior cervix was grasped with a single tooth tenaculum.  Lacrimal probe dilator  followed by Hanks dilators were used to dilate the cervix.  An endocervical curettage was performed.  Additional dilation to a 19 Hanks dilator was used.  Cold blood resulted during the dilation process.  The hysteroscope was advanced without  difficulty.  Lactated Ringer's was used as a distending medium.  A thorough evaluation of the endometrial cavity revealed atrophic endometrium with some products of old blood and scant tissue noted.  There was no endometrial mass identified.  Pictures  taken.  Hysteroscope was removed and an endometrial curettage was performed with scant tissue.  Good hemostasis was noted.  COMPLICATIONS:  There were no  complications.  ESTIMATED BLOOD LOSS:  Minimal.  INTRAOPERATIVE FLUIDS:  1000 mL.  Straight catheterization of the bladder before commencement of the case yielding a 25 mL dark urine.    The patient was taken to recovery room in good condition.  AN/NUANCE  D:07/18/2018 T:07/18/2018 JOB:[id number]

## 2018-07-20 LAB — SURGICAL PATHOLOGY

## 2018-07-21 ENCOUNTER — Ambulatory Visit: Payer: Medicare Other | Admitting: Nurse Practitioner

## 2019-01-18 DIAGNOSIS — R634 Abnormal weight loss: Secondary | ICD-10-CM | POA: Diagnosis not present

## 2019-01-18 DIAGNOSIS — E538 Deficiency of other specified B group vitamins: Secondary | ICD-10-CM | POA: Diagnosis not present

## 2019-01-18 DIAGNOSIS — K219 Gastro-esophageal reflux disease without esophagitis: Secondary | ICD-10-CM | POA: Diagnosis not present

## 2019-01-18 DIAGNOSIS — I700 Atherosclerosis of aorta: Secondary | ICD-10-CM | POA: Diagnosis not present

## 2019-01-26 DIAGNOSIS — M519 Unspecified thoracic, thoracolumbar and lumbosacral intervertebral disc disorder: Secondary | ICD-10-CM | POA: Diagnosis not present

## 2019-01-26 DIAGNOSIS — G301 Alzheimer's disease with late onset: Secondary | ICD-10-CM | POA: Diagnosis not present

## 2019-01-26 DIAGNOSIS — J479 Bronchiectasis, uncomplicated: Secondary | ICD-10-CM | POA: Diagnosis not present

## 2019-01-26 DIAGNOSIS — G309 Alzheimer's disease, unspecified: Secondary | ICD-10-CM | POA: Diagnosis not present

## 2019-01-26 DIAGNOSIS — E7800 Pure hypercholesterolemia, unspecified: Secondary | ICD-10-CM | POA: Diagnosis not present

## 2019-01-26 DIAGNOSIS — Z0000 Encounter for general adult medical examination without abnormal findings: Secondary | ICD-10-CM | POA: Diagnosis not present

## 2019-01-26 DIAGNOSIS — E43 Unspecified severe protein-calorie malnutrition: Secondary | ICD-10-CM | POA: Diagnosis not present

## 2019-01-26 DIAGNOSIS — D472 Monoclonal gammopathy: Secondary | ICD-10-CM | POA: Diagnosis not present

## 2019-01-26 DIAGNOSIS — Z1211 Encounter for screening for malignant neoplasm of colon: Secondary | ICD-10-CM | POA: Diagnosis not present

## 2019-01-26 DIAGNOSIS — F419 Anxiety disorder, unspecified: Secondary | ICD-10-CM | POA: Diagnosis not present

## 2019-01-26 DIAGNOSIS — M791 Myalgia, unspecified site: Secondary | ICD-10-CM | POA: Diagnosis not present

## 2019-01-26 DIAGNOSIS — K219 Gastro-esophageal reflux disease without esophagitis: Secondary | ICD-10-CM | POA: Diagnosis not present

## 2019-01-26 DIAGNOSIS — D72819 Decreased white blood cell count, unspecified: Secondary | ICD-10-CM | POA: Diagnosis not present

## 2019-02-02 DIAGNOSIS — M810 Age-related osteoporosis without current pathological fracture: Secondary | ICD-10-CM | POA: Diagnosis not present

## 2019-03-23 DIAGNOSIS — D72819 Decreased white blood cell count, unspecified: Secondary | ICD-10-CM | POA: Diagnosis not present

## 2019-04-03 DIAGNOSIS — K219 Gastro-esophageal reflux disease without esophagitis: Secondary | ICD-10-CM | POA: Diagnosis not present

## 2019-04-03 DIAGNOSIS — M810 Age-related osteoporosis without current pathological fracture: Secondary | ICD-10-CM | POA: Diagnosis not present

## 2019-09-04 DIAGNOSIS — G301 Alzheimer's disease with late onset: Secondary | ICD-10-CM | POA: Diagnosis not present

## 2019-09-04 DIAGNOSIS — F0280 Dementia in other diseases classified elsewhere without behavioral disturbance: Secondary | ICD-10-CM | POA: Diagnosis not present

## 2019-09-04 DIAGNOSIS — D472 Monoclonal gammopathy: Secondary | ICD-10-CM | POA: Diagnosis not present

## 2019-09-04 DIAGNOSIS — E7800 Pure hypercholesterolemia, unspecified: Secondary | ICD-10-CM | POA: Diagnosis not present

## 2019-09-04 DIAGNOSIS — E43 Unspecified severe protein-calorie malnutrition: Secondary | ICD-10-CM | POA: Diagnosis not present

## 2019-09-04 DIAGNOSIS — J479 Bronchiectasis, uncomplicated: Secondary | ICD-10-CM | POA: Diagnosis not present

## 2019-09-04 DIAGNOSIS — J449 Chronic obstructive pulmonary disease, unspecified: Secondary | ICD-10-CM | POA: Diagnosis not present

## 2019-09-20 DIAGNOSIS — F0280 Dementia in other diseases classified elsewhere without behavioral disturbance: Secondary | ICD-10-CM | POA: Diagnosis not present

## 2019-09-20 DIAGNOSIS — D472 Monoclonal gammopathy: Secondary | ICD-10-CM | POA: Diagnosis not present

## 2019-09-20 DIAGNOSIS — G309 Alzheimer's disease, unspecified: Secondary | ICD-10-CM | POA: Diagnosis not present

## 2019-09-20 DIAGNOSIS — F0150 Vascular dementia without behavioral disturbance: Secondary | ICD-10-CM | POA: Diagnosis not present

## 2019-09-20 DIAGNOSIS — E43 Unspecified severe protein-calorie malnutrition: Secondary | ICD-10-CM | POA: Diagnosis not present

## 2019-09-29 ENCOUNTER — Telehealth: Payer: Self-pay

## 2019-09-29 NOTE — Telephone Encounter (Signed)
Telephone call to patients home, spoke with patients husband Luci Bank requests Telehealth palliative visit to discuss palliatuve care for patient 10-03-19 at 3:00 PM.

## 2019-10-02 DIAGNOSIS — E43 Unspecified severe protein-calorie malnutrition: Secondary | ICD-10-CM | POA: Diagnosis not present

## 2019-10-03 ENCOUNTER — Other Ambulatory Visit: Payer: PPO

## 2019-10-03 ENCOUNTER — Other Ambulatory Visit: Payer: Self-pay

## 2020-02-28 DIAGNOSIS — J479 Bronchiectasis, uncomplicated: Secondary | ICD-10-CM | POA: Diagnosis not present

## 2020-02-28 DIAGNOSIS — D472 Monoclonal gammopathy: Secondary | ICD-10-CM | POA: Diagnosis not present

## 2020-02-28 DIAGNOSIS — E7800 Pure hypercholesterolemia, unspecified: Secondary | ICD-10-CM | POA: Diagnosis not present

## 2020-03-20 DIAGNOSIS — F0280 Dementia in other diseases classified elsewhere without behavioral disturbance: Secondary | ICD-10-CM | POA: Diagnosis not present

## 2020-03-20 DIAGNOSIS — G309 Alzheimer's disease, unspecified: Secondary | ICD-10-CM | POA: Diagnosis not present

## 2020-03-20 DIAGNOSIS — F0150 Vascular dementia without behavioral disturbance: Secondary | ICD-10-CM | POA: Diagnosis not present

## 2020-03-25 ENCOUNTER — Telehealth: Payer: Self-pay

## 2020-03-25 NOTE — Telephone Encounter (Signed)
Telephone call to schedule palliative care visit.  Patients husband states he is working with [employer] to get someone to stay with patient for a few hours each week.  Husband states he nor wife have received CoVid 19 vaccines and he does not want anyone coming into home.  RN informed husband visits could be done telehealth or by phone.  Husband states he wants to wait and work with Secondary school teacher.  Husband states he will call back if he decides he wants service for patient.

## 2020-03-25 NOTE — Telephone Encounter (Signed)
Telephone call to patient's home to schedule palliative care visit.  Noone answered phone.  RN left message requesting call back to schedule palliative care visit.

## 2020-03-27 ENCOUNTER — Telehealth: Payer: Self-pay

## 2020-03-27 NOTE — Telephone Encounter (Signed)
Telephone call to NP [facility] office, spoke with Nevin Bloodgood.  Informed Nevin Bloodgood to inform NP that husband does not want home palliative care services for wife at the present time.

## 2021-01-06 ENCOUNTER — Inpatient Hospital Stay
Admission: EM | Admit: 2021-01-06 | Discharge: 2021-01-16 | DRG: 308 | Disposition: A | Discharge status: Skilled Nursing Facility | Payer: Medicare Other | Attending: Internal Medicine | Admitting: Internal Medicine

## 2021-01-06 ENCOUNTER — Other Ambulatory Visit: Payer: Self-pay

## 2021-01-06 ENCOUNTER — Emergency Department: Payer: Medicare Other

## 2021-01-06 DIAGNOSIS — F0280 Dementia in other diseases classified elsewhere without behavioral disturbance: Secondary | ICD-10-CM | POA: Diagnosis present

## 2021-01-06 DIAGNOSIS — Z8249 Family history of ischemic heart disease and other diseases of the circulatory system: Secondary | ICD-10-CM

## 2021-01-06 DIAGNOSIS — S42212A Unspecified displaced fracture of surgical neck of left humerus, initial encounter for closed fracture: Secondary | ICD-10-CM | POA: Diagnosis not present

## 2021-01-06 DIAGNOSIS — M47819 Spondylosis without myelopathy or radiculopathy, site unspecified: Secondary | ICD-10-CM | POA: Diagnosis present

## 2021-01-06 DIAGNOSIS — F0150 Vascular dementia without behavioral disturbance: Secondary | ICD-10-CM | POA: Diagnosis present

## 2021-01-06 DIAGNOSIS — D62 Acute posthemorrhagic anemia: Secondary | ICD-10-CM | POA: Diagnosis present

## 2021-01-06 DIAGNOSIS — F32A Depression, unspecified: Secondary | ICD-10-CM | POA: Diagnosis present

## 2021-01-06 DIAGNOSIS — W1830XA Fall on same level, unspecified, initial encounter: Secondary | ICD-10-CM | POA: Diagnosis present

## 2021-01-06 DIAGNOSIS — W19XXXA Unspecified fall, initial encounter: Secondary | ICD-10-CM | POA: Diagnosis not present

## 2021-01-06 DIAGNOSIS — E162 Hypoglycemia, unspecified: Secondary | ICD-10-CM | POA: Diagnosis present

## 2021-01-06 DIAGNOSIS — S52572A Other intraarticular fracture of lower end of left radius, initial encounter for closed fracture: Secondary | ICD-10-CM | POA: Diagnosis present

## 2021-01-06 DIAGNOSIS — R0682 Tachypnea, not elsewhere classified: Secondary | ICD-10-CM | POA: Diagnosis present

## 2021-01-06 DIAGNOSIS — E43 Unspecified severe protein-calorie malnutrition: Secondary | ICD-10-CM | POA: Diagnosis present

## 2021-01-06 DIAGNOSIS — S82131A Displaced fracture of medial condyle of right tibia, initial encounter for closed fracture: Secondary | ICD-10-CM

## 2021-01-06 DIAGNOSIS — S82141A Displaced bicondylar fracture of right tibia, initial encounter for closed fracture: Secondary | ICD-10-CM | POA: Diagnosis present

## 2021-01-06 DIAGNOSIS — S52612A Displaced fracture of left ulna styloid process, initial encounter for closed fracture: Secondary | ICD-10-CM | POA: Diagnosis present

## 2021-01-06 DIAGNOSIS — S42215A Unspecified nondisplaced fracture of surgical neck of left humerus, initial encounter for closed fracture: Secondary | ICD-10-CM | POA: Diagnosis present

## 2021-01-06 DIAGNOSIS — M810 Age-related osteoporosis without current pathological fracture: Secondary | ICD-10-CM | POA: Diagnosis present

## 2021-01-06 DIAGNOSIS — G309 Alzheimer's disease, unspecified: Secondary | ICD-10-CM | POA: Diagnosis present

## 2021-01-06 DIAGNOSIS — J449 Chronic obstructive pulmonary disease, unspecified: Secondary | ICD-10-CM | POA: Diagnosis present

## 2021-01-06 DIAGNOSIS — Y92009 Unspecified place in unspecified non-institutional (private) residence as the place of occurrence of the external cause: Secondary | ICD-10-CM

## 2021-01-06 DIAGNOSIS — I959 Hypotension, unspecified: Secondary | ICD-10-CM | POA: Diagnosis present

## 2021-01-06 DIAGNOSIS — E785 Hyperlipidemia, unspecified: Secondary | ICD-10-CM | POA: Diagnosis present

## 2021-01-06 DIAGNOSIS — R6510 Systemic inflammatory response syndrome (SIRS) of non-infectious origin without acute organ dysfunction: Secondary | ICD-10-CM

## 2021-01-06 DIAGNOSIS — W19XXXD Unspecified fall, subsequent encounter: Secondary | ICD-10-CM | POA: Diagnosis not present

## 2021-01-06 DIAGNOSIS — R64 Cachexia: Secondary | ICD-10-CM | POA: Diagnosis present

## 2021-01-06 DIAGNOSIS — Z681 Body mass index (BMI) 19 or less, adult: Secondary | ICD-10-CM

## 2021-01-06 DIAGNOSIS — I4892 Unspecified atrial flutter: Secondary | ICD-10-CM | POA: Diagnosis present

## 2021-01-06 DIAGNOSIS — E7800 Pure hypercholesterolemia, unspecified: Secondary | ICD-10-CM | POA: Diagnosis present

## 2021-01-06 DIAGNOSIS — I4891 Unspecified atrial fibrillation: Secondary | ICD-10-CM | POA: Diagnosis present

## 2021-01-06 DIAGNOSIS — Z823 Family history of stroke: Secondary | ICD-10-CM

## 2021-01-06 DIAGNOSIS — S82141D Displaced bicondylar fracture of right tibia, subsequent encounter for closed fracture with routine healing: Secondary | ICD-10-CM | POA: Diagnosis not present

## 2021-01-06 DIAGNOSIS — Z20822 Contact with and (suspected) exposure to covid-19: Secondary | ICD-10-CM | POA: Diagnosis present

## 2021-01-06 DIAGNOSIS — Z820 Family history of epilepsy and other diseases of the nervous system: Secondary | ICD-10-CM

## 2021-01-06 DIAGNOSIS — S42202A Unspecified fracture of upper end of left humerus, initial encounter for closed fracture: Secondary | ICD-10-CM | POA: Diagnosis not present

## 2021-01-06 DIAGNOSIS — S42302A Unspecified fracture of shaft of humerus, left arm, initial encounter for closed fracture: Secondary | ICD-10-CM | POA: Diagnosis present

## 2021-01-06 DIAGNOSIS — M25532 Pain in left wrist: Secondary | ICD-10-CM

## 2021-01-06 LAB — CBC WITH DIFFERENTIAL/PLATELET
Abs Immature Granulocytes: 0.11 10*3/uL — ABNORMAL HIGH (ref 0.00–0.07)
Basophils Absolute: 0 10*3/uL (ref 0.0–0.1)
Basophils Relative: 0 %
Eosinophils Absolute: 0 10*3/uL (ref 0.0–0.5)
Eosinophils Relative: 0 %
HCT: 27.5 % — ABNORMAL LOW (ref 36.0–46.0)
Hemoglobin: 9.1 g/dL — ABNORMAL LOW (ref 12.0–15.0)
Immature Granulocytes: 1 %
Lymphocytes Relative: 2 %
Lymphs Abs: 0.3 10*3/uL — ABNORMAL LOW (ref 0.7–4.0)
MCH: 33.3 pg (ref 26.0–34.0)
MCHC: 33.1 g/dL (ref 30.0–36.0)
MCV: 100.7 fL — ABNORMAL HIGH (ref 80.0–100.0)
Monocytes Absolute: 1.2 10*3/uL — ABNORMAL HIGH (ref 0.1–1.0)
Monocytes Relative: 7 %
Neutro Abs: 15.6 10*3/uL — ABNORMAL HIGH (ref 1.7–7.7)
Neutrophils Relative %: 90 %
Platelets: 139 10*3/uL — ABNORMAL LOW (ref 150–400)
RBC: 2.73 MIL/uL — ABNORMAL LOW (ref 3.87–5.11)
RDW: 13.9 % (ref 11.5–15.5)
WBC: 17.2 10*3/uL — ABNORMAL HIGH (ref 4.0–10.5)
nRBC: 0 % (ref 0.0–0.2)

## 2021-01-06 LAB — BASIC METABOLIC PANEL
Anion gap: 13 (ref 5–15)
BUN: 15 mg/dL (ref 8–23)
CO2: 25 mmol/L (ref 22–32)
Calcium: 9 mg/dL (ref 8.9–10.3)
Chloride: 105 mmol/L (ref 98–111)
Creatinine, Ser: 1.17 mg/dL — ABNORMAL HIGH (ref 0.44–1.00)
GFR, Estimated: 48 mL/min — ABNORMAL LOW (ref >60)
Glucose, Bld: 171 mg/dL — ABNORMAL HIGH (ref 70–99)
Potassium: 4 mmol/L (ref 3.5–5.1)
Sodium: 143 mmol/L (ref 135–145)

## 2021-01-06 LAB — CBC
HCT: 37 % (ref 36.0–46.0)
Hemoglobin: 12 g/dL (ref 12.0–15.0)
MCH: 32.8 pg (ref 26.0–34.0)
MCHC: 32.4 g/dL (ref 30.0–36.0)
MCV: 101.1 fL — ABNORMAL HIGH (ref 80.0–100.0)
Platelets: 175 10*3/uL (ref 150–400)
RBC: 3.66 MIL/uL — ABNORMAL LOW (ref 3.87–5.11)
RDW: 13.7 % (ref 11.5–15.5)
WBC: 14.9 10*3/uL — ABNORMAL HIGH (ref 4.0–10.5)
nRBC: 0 % (ref 0.0–0.2)

## 2021-01-06 LAB — COMPREHENSIVE METABOLIC PANEL WITH GFR
ALT: 21 U/L (ref 0–44)
AST: 23 U/L (ref 15–41)
Albumin: 3.5 g/dL (ref 3.5–5.0)
Alkaline Phosphatase: 23 U/L — ABNORMAL LOW (ref 38–126)
Anion gap: 8 (ref 5–15)
BUN: 16 mg/dL (ref 8–23)
CO2: 26 mmol/L (ref 22–32)
Calcium: 8.1 mg/dL — ABNORMAL LOW (ref 8.9–10.3)
Chloride: 110 mmol/L (ref 98–111)
Creatinine, Ser: 1.08 mg/dL — ABNORMAL HIGH (ref 0.44–1.00)
GFR, Estimated: 53 mL/min — ABNORMAL LOW
Glucose, Bld: 151 mg/dL — ABNORMAL HIGH (ref 70–99)
Potassium: 3.7 mmol/L (ref 3.5–5.1)
Sodium: 144 mmol/L (ref 135–145)
Total Bilirubin: 1.1 mg/dL (ref 0.3–1.2)
Total Protein: 5.4 g/dL — ABNORMAL LOW (ref 6.5–8.1)

## 2021-01-06 LAB — LACTIC ACID, PLASMA: Lactic Acid, Venous: 2.3 mmol/L (ref 0.5–1.9)

## 2021-01-06 LAB — PROTIME-INR
INR: 1.2 (ref 0.8–1.2)
Prothrombin Time: 14.4 seconds (ref 11.4–15.2)

## 2021-01-06 LAB — PROCALCITONIN: Procalcitonin: 0.29 ng/mL

## 2021-01-06 LAB — SARS CORONAVIRUS 2 BY RT PCR (HOSPITAL ORDER, PERFORMED IN ~~LOC~~ HOSPITAL LAB): SARS Coronavirus 2: NEGATIVE

## 2021-01-06 LAB — TROPONIN I (HIGH SENSITIVITY): Troponin I (High Sensitivity): 8 ng/L (ref <18)

## 2021-01-06 LAB — APTT: aPTT: 31 seconds (ref 24–36)

## 2021-01-06 MED ORDER — SODIUM CHLORIDE 0.9 % IV SOLN
2.0000 g | INTRAVENOUS | Status: DC
  Filled 2021-01-06: qty 2

## 2021-01-06 MED ORDER — ASCORBIC ACID 500 MG PO TABS
1000.0000 mg | ORAL_TABLET | Freq: Every day | ORAL | Status: DC
Start: 2021-01-07 — End: 2021-01-16
  Administered 2021-01-07 – 2021-01-16 (×9): 1000 mg via ORAL
  Filled 2021-01-06 (×11): qty 2

## 2021-01-06 MED ORDER — SODIUM CHLORIDE 0.9 % IV SOLN: 2.0000 g | Freq: Once | INTRAVENOUS | Status: DC

## 2021-01-06 MED ORDER — CALCIUM CARBONATE 1250 (500 CA) MG PO TABS
1250.0000 mg | ORAL_TABLET | Freq: Every day | ORAL | Status: DC
  Administered 2021-01-09 – 2021-01-16 (×8): 1250 mg via ORAL
  Filled 2021-01-06 (×12): qty 1

## 2021-01-06 MED ORDER — ADULT MULTIVITAMIN W/MINERALS CH
1.0000 | ORAL_TABLET | Freq: Every day | ORAL | Status: DC
  Administered 2021-01-07 – 2021-01-16 (×9): 1 via ORAL
  Filled 2021-01-06 (×11): qty 1

## 2021-01-06 MED ORDER — SODIUM CHLORIDE 0.9 % IV SOLN
2.0000 g | Freq: Once | INTRAVENOUS | Status: AC
  Administered 2021-01-06: 2 g via INTRAVENOUS
  Filled 2021-01-06: qty 2

## 2021-01-06 MED ORDER — ONDANSETRON HCL 4 MG/2ML IJ SOLN: 4.0000 mg | Freq: Four times a day (QID) | INTRAMUSCULAR | Status: DC | PRN

## 2021-01-06 MED ORDER — DONEPEZIL HCL 5 MG PO TABS
5.0000 mg | ORAL_TABLET | Freq: Every day | ORAL | Status: DC
  Administered 2021-01-06 – 2021-01-15 (×10): 5 mg via ORAL
  Filled 2021-01-06 (×12): qty 1

## 2021-01-06 MED ORDER — SODIUM CHLORIDE 0.9 % IV SOLN: INTRAVENOUS | Status: DC

## 2021-01-06 MED ORDER — FENTANYL CITRATE (PF) 100 MCG/2ML IJ SOLN: 50.0000 ug | Freq: Once | INTRAMUSCULAR | Status: AC

## 2021-01-06 MED ORDER — DRONABINOL 2.5 MG PO CAPS
2.5000 mg | ORAL_CAPSULE | Freq: Every day | ORAL | Status: DC
Start: 2021-01-07 — End: 2021-01-16
  Administered 2021-01-09 – 2021-01-16 (×8): 2.5 mg via ORAL
  Filled 2021-01-06 (×9): qty 1

## 2021-01-06 MED ORDER — COENZYME Q10 50 MG PO CAPS: 120.0000 mg | ORAL_CAPSULE | Freq: Every day | ORAL | Status: DC

## 2021-01-06 MED ORDER — MAGNESIUM OXIDE 400 (241.3 MG) MG PO TABS
400.0000 mg | ORAL_TABLET | Freq: Every day | ORAL | Status: DC
  Administered 2021-01-07 – 2021-01-16 (×9): 400 mg via ORAL
  Filled 2021-01-06 (×10): qty 1

## 2021-01-06 MED ORDER — METRONIDAZOLE IN NACL 5-0.79 MG/ML-% IV SOLN: 500.0000 mg | Freq: Three times a day (TID) | INTRAVENOUS | Status: DC

## 2021-01-06 MED ORDER — APIXABAN 2.5 MG PO TABS
2.5000 mg | ORAL_TABLET | Freq: Two times a day (BID) | ORAL | Status: DC
  Filled 2021-01-06 (×4): qty 1

## 2021-01-06 MED ORDER — ESCITALOPRAM OXALATE 10 MG PO TABS
5.0000 mg | ORAL_TABLET | Freq: Every day | ORAL | Status: DC
  Administered 2021-01-07 – 2021-01-16 (×9): 5 mg via ORAL
  Filled 2021-01-06 (×5): qty 0.5
  Filled 2021-01-06: qty 1
  Filled 2021-01-06 (×4): qty 0.5

## 2021-01-06 MED ORDER — ZOLPIDEM TARTRATE 5 MG PO TABS
5.0000 mg | ORAL_TABLET | Freq: Every evening | ORAL | Status: DC | PRN
Start: 2021-01-06 — End: 2021-01-16
  Administered 2021-01-07 – 2021-01-11 (×3): 5 mg via ORAL
  Filled 2021-01-06 (×3): qty 1

## 2021-01-06 MED ORDER — VANCOMYCIN HCL IN DEXTROSE 1-5 GM/200ML-% IV SOLN: 1000.0000 mg | Freq: Once | INTRAVENOUS | Status: DC

## 2021-01-06 MED ORDER — SODIUM CHLORIDE 0.9 % IV BOLUS
1000.0000 mL | Freq: Once | INTRAVENOUS | Status: AC
  Administered 2021-01-06: 1000 mL via INTRAVENOUS

## 2021-01-06 MED ORDER — VANCOMYCIN HCL 500 MG/100ML IV SOLN
500.0000 mg | INTRAVENOUS | Status: DC
  Filled 2021-01-06: qty 100

## 2021-01-06 MED ORDER — RISAQUAD PO CAPS
ORAL_CAPSULE | Freq: Every day | ORAL | Status: DC
  Administered 2021-01-07 – 2021-01-16 (×8): 1 via ORAL
  Filled 2021-01-06 (×10): qty 1

## 2021-01-06 MED ORDER — DILTIAZEM HCL-DEXTROSE 125-5 MG/125ML-% IV SOLN (PREMIX)
5.0000 mg/h | INTRAVENOUS | Status: DC
  Administered 2021-01-06 – 2021-01-07 (×2): 5 mg/h via INTRAVENOUS
  Administered 2021-01-08: 10 mg/h via INTRAVENOUS
  Filled 2021-01-06 (×3): qty 125

## 2021-01-06 MED ORDER — VANCOMYCIN HCL 750 MG/150ML IV SOLN
750.0000 mg | Freq: Once | INTRAVENOUS | Status: AC
  Administered 2021-01-06: 750 mg via INTRAVENOUS
  Filled 2021-01-06: qty 150

## 2021-01-06 MED ORDER — ACETAMINOPHEN 325 MG PO TABS
650.0000 mg | ORAL_TABLET | ORAL | Status: DC | PRN
  Administered 2021-01-07 – 2021-01-16 (×12): 650 mg via ORAL
  Filled 2021-01-06 (×13): qty 2

## 2021-01-06 MED ORDER — MORPHINE SULFATE (PF) 4 MG/ML IV SOLN
4.0000 mg | Freq: Once | INTRAVENOUS | Status: AC
  Administered 2021-01-06: 4 mg via INTRAVENOUS
  Filled 2021-01-06: qty 1

## 2021-01-06 MED ORDER — MIRTAZAPINE 15 MG PO TABS
15.0000 mg | ORAL_TABLET | Freq: Every day | ORAL | Status: DC
  Administered 2021-01-06: 15 mg via ORAL
  Filled 2021-01-06: qty 1

## 2021-01-06 MED ORDER — DILTIAZEM HCL-DEXTROSE 125-5 MG/125ML-% IV SOLN (PREMIX)
5.0000 mg/h | INTRAVENOUS | Status: DC
  Administered 2021-01-07: 10 mg/h via INTRAVENOUS

## 2021-01-06 MED ORDER — ONDANSETRON HCL 4 MG/2ML IJ SOLN
4.0000 mg | Freq: Once | INTRAMUSCULAR | Status: AC
  Administered 2021-01-06: 4 mg via INTRAVENOUS
  Filled 2021-01-06: qty 2

## 2021-01-06 MED ORDER — SODIUM CHLORIDE 0.9 % IV BOLUS
500.0000 mL | Freq: Once | INTRAVENOUS | Status: AC
  Administered 2021-01-06: 500 mL via INTRAVENOUS

## 2021-01-06 MED ORDER — ALPRAZOLAM 0.5 MG PO TABS
0.2500 mg | ORAL_TABLET | Freq: Two times a day (BID) | ORAL | Status: DC | PRN
Start: 2021-01-06 — End: 2021-01-16
  Administered 2021-01-07 – 2021-01-15 (×2): 0.25 mg via ORAL
  Filled 2021-01-06 (×2): qty 1

## 2021-01-06 MED ORDER — VITAMIN D3 25 MCG (1000 UNIT) PO TABS
2000.0000 [IU] | ORAL_TABLET | Freq: Every day | ORAL | Status: DC
  Administered 2021-01-07 – 2021-01-16 (×9): 2000 [IU] via ORAL
  Filled 2021-01-06 (×20): qty 2

## 2021-01-06 MED ORDER — FENTANYL CITRATE (PF) 100 MCG/2ML IJ SOLN
INTRAMUSCULAR | Status: AC
  Administered 2021-01-06: 50 ug via INTRAVENOUS
  Filled 2021-01-06: qty 2

## 2021-01-06 MED ORDER — METRONIDAZOLE IN NACL 5-0.79 MG/ML-% IV SOLN
500.0000 mg | Freq: Three times a day (TID) | INTRAVENOUS | Status: DC
  Administered 2021-01-07 (×2): 500 mg via INTRAVENOUS
  Filled 2021-01-06 (×5): qty 100

## 2021-01-06 MED ORDER — VITAMIN E 45 MG (100 UNIT) PO CAPS
400.0000 [IU] | ORAL_CAPSULE | Freq: Every day | ORAL | Status: DC
  Administered 2021-01-09 – 2021-01-16 (×8): 400 [IU] via ORAL
  Filled 2021-01-06 (×10): qty 4

## 2021-01-06 NOTE — Consult Note (Signed)
Pharmacy Antibiotic Note  Haley Kim is a 75 y.o. female admitted on 01/06/2021 with sepsis.  Pharmacy has been consulted for Vancomycin & Cefepime dosing.  Plan: Vancomycin 750mg  x1; f/b 500mg  q48h Scr: 1.17; wt: 31.8kg; ht: historic 66" CrCl: ~7ml/min; AUC: 515.6, Cmax: 34.2; Cmin: 12.4  Cefepime 2g x1; f/b 2g q24h Metronidazole 500mg  IV q8h  Weight: 31.8 kg (70 lb)  Temp (24hrs), Avg:98 F (36.7 C), Min:98 F (36.7 C), Max:98 F (36.7 C)  Recent Labs  Lab 01/06/21 1548  WBC 14.9*  CREATININE 1.17*    CrCl cannot be calculated (Unknown ideal weight.).    Allergies  Allergen Reactions   Donepezil     hallucinations   Dronabinol    Mirtazapine     hallucinations    Antimicrobials this admission: VAN (2/07 >>  CFP (2/07 >>  MTZ (2/07 >>   Microbiology results: 2/07 BCx: pending 2/07 Sputum: pending  2/07 MRSA PCR: pending  Thank you for allowing pharmacy to be a part of this patients care.  Lorna Dibble 01/06/2021 8:42 PM

## 2021-01-06 NOTE — ED Provider Notes (Signed)
[facility] Emergency Department Provider Note  Time seen: 4:27 PM  I have reviewed the triage vital signs and the nursing notes.   HISTORY  Chief Complaint Fall   HPI Haley Kim is a 75 y.o. female with a past medical history of anxiety, COPD, dementia, presents to the emergency department after a fall.  According to the husband he heard her fall but did not witness the fall.  Patient was complaining of pain to her left shoulder and right knee.  Patient continues to complain of pain to her left shoulder she did have tenderness to the right knee with range of motion.  No clear loss of consciousness.  Patient currently in a c-collar.  Patient is unsure why she fell.  Does appear to be quite tachycardic around 130 bpm, possibly irregular/A. fib although husband denies any history of this.   Past Medical History:  Diagnosis Date  . Allergy   . Anxiety   . Chronic cough   . Chronic headache   . COPD (chronic obstructive pulmonary disease) ([place])   . Dehydration   . Disorders of bilirubin excretion   . Loss of appetite   . Loss of weight   . Memory loss   . NSVD (normal spontaneous vaginal delivery)    x 1  . Pure hypercholesterolemia   . 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 Weight loss   . Wrist fracture 01/2004   fell on tennis court, muscle problem caused  fall    Patient Active Problem List   Diagnosis Date Noted  . Malnutrition ([place]) 07/20/2016  . Depression 05/14/2016  . Mild cognitive impairment 04/01/2016  . Medicare annual wellness visit, subsequent 01/22/2016  . Pulmonary nodule, right middle lobe, 29mm, cavitary - Nov 2013 (done for chronic cough) 10/20/2012  . Memory loss 08/16/2012  . COPD (chronic obstructive pulmonary disease) ([place]) 08/16/2012  . Osteopenia 11/19/2011  . Carotid stenosis, bilateral 04/09/2011  . 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 HYPERCHOLESTEROLEMIA 01/18/2008  .  GILBERT'S SYNDROME 01/18/2008  . WEIGHT LOSS 05/10/2007    Past Surgical History:  Procedure Laterality Date  . BREAST LUMPECTOMY     bilateral, age 88  . DILATATION & CURETTAGE/HYSTEROSCOPY WITH MYOSURE N/A 07/18/2018   Procedure: DILATATION & CURETTAGE/HYSTEROSCOPY WITH MYOSURE;  Surgeon: Schermerhorn, Gwen Her, MD;  Location: [facility] ORS;  Service: Gynecology;  Laterality: N/A;  . HYSTEROSCOPY WITH D & C N/A 07/18/2018   Procedure: DILATATION AND CURETTAGE /HYSTEROSCOPY;  Surgeon: Schermerhorn, Gwen Her, MD;  Location: [facility] ORS;  Service: Gynecology;  Laterality: N/A;  . TUBAL LIGATION      Prior to Admission medications   Medication Sig Start Date End Date Taking? Authorizing Provider  Calcium Carbonate (CALCIUM 600 PO) Take 1 tablet by mouth daily.    [provider]  Cholecalciferol (VITAMIN D3) 2000 UNITS TABS Take by mouth as directed.    [provider]  Coenzyme Q10 50 MG CAPS Take 120 mg by mouth daily.     [provider]  donepezil (ARICEPT) 5 MG tablet Take by mouth. 02/18/16 02/17/17  [provider]  dronabinol (MARINOL) 2.5 MG capsule  03/03/16   [provider]  escitalopram (LEXAPRO) 5 MG tablet Take 5 mg by mouth daily.    [provider]  MAGNESIUM PO Take 400 mg by mouth as directed.     [provider]  mirtazapine (REMERON) 7.5 MG tablet Take 2 tablets (15 mg total) by  mouth at bedtime. 05/14/16   Marcial Pacas, MD  Multiple Vitamin (MULTIVITAMIN) tablet Take 1 tablet by mouth daily.      [provider]  Probiotic Product (PROBIOTIC PO) Take by mouth daily.      [provider]  vitamin C (ASCORBIC ACID) 500 MG tablet Take 1,000 mg by mouth daily.     [provider]  vitamin E 400 UNIT capsule Take 400 Units by mouth daily.      [provider]    Allergies  Allergen Reactions  . Donepezil     hallucinations  . Dronabinol   . Mirtazapine     hallucinations    Family  History  Problem Relation Age of Onset  . Alzheimer's disease Mother   . Alzheimer's disease Father   . Stroke Father        x 5  . Heart disease Father   . Cancer Paternal Aunt        throat  . Cancer Paternal Grandmother        throat  . Diabetes Neg Hx   . Alcohol abuse Neg Hx   . Colon cancer Neg Hx     Social History Social History   Tobacco Use  . Smoking status: Never Smoker  . Smokeless tobacco: Never Used  Vaping Use  . Vaping Use: Never used  Substance Use Topics  . Alcohol use: No  . Drug use: No    Review of Systems Per patient and husband Constitutional: Unsure if there was loss of consciousness Cardiovascular: No complaints of chest pain. Respiratory: No known shortness of breath. Gastrointestinal: No recent vomiting or diarrhea. Musculoskeletal: Left shoulder pain, right knee pain Skin: Negative for skin complaints  Neurological: Negative for headache All other ROS negative  ____________________________________________   PHYSICAL EXAM:  VITAL SIGNS: ED Triage Vitals  Enc Vitals Group     BP 01/06/21 1543 97/78     Pulse Rate 01/06/21 1543 72     Resp 01/06/21 1543 19     Temp 01/06/21 1543 98 F (36.7 C)     Temp src --      SpO2 01/06/21 1543 96 %     Weight 01/06/21 1547 70 lb (31.8 kg)     Height --      Head Circumference --      Peak Flow --      Pain Score 01/06/21 1543 10     Pain Loc --      Pain Edu? --      Excl. in [place]? --     Constitutional: Patient is awake and alert, no significant distress but does appear to be uncomfortable, holding her left shoulder.  Patient is quite frail/cachectic in appearance. Eyes: Normal exam ENT      Head: Normocephalic and atraumatic.      Mouth/Throat: Mucous membranes are moist. Cardiovascular: Appears to be regular rhythm around 120 bpm. Respiratory: Normal respiratory effort without tachypnea nor retractions. Breath sounds are clear  Gastrointestinal: Soft and nontender. No  distention. Musculoskeletal: Moderate tenderness palpation of the left shoulder with significant pain with range of motion.  Neurovascular intact distally.  Mild pain with range of motion to right knee.  No hip tenderness with good range of motion. Neurologic:  Normal speech and language. No gross focal neurologic deficits  Skin:  Skin is warm, dry and intact.  Psychiatric: Mood and affect are normal  ____________________________________________    EKG  EKG viewed and interpreted by  myself appears to show an irregular rhythm at 164 bpm with a narrow QRS, normal axis, normal intervals with nonspecific ST changes.  ____________________________________________    RADIOLOGY  Left shoulder x-ray shows nondisplaced left humeral neck fracture. Knee x-ray shows no definitive fracture but suspicious we will proceed with CT Pelvis x-ray is negative. CT scan head and neck are negative for acute abnormality.  ____________________________________________   INITIAL IMPRESSION / ASSESSMENT AND PLAN / ED COURSE  Pertinent labs & imaging results that were available during my care of the patient were reviewed by me and considered in my medical decision making (see chart for details).   Patient presents emergency department after an unwitnessed fall.  History of dementia, cannot recall how/why she fell.  Patient does have tenderness to palpation of the left shoulder with significant pain with range of motion, suspect likely fracture.  Mild tenderness and pain with range of motion of the right knee.  Given the unwitnessed nature of the fall we will obtain CT imaging of the head and C-spine as precaution.  We will obtain x-ray images of the left shoulder pelvis and right knee.  We will check basic labs, IV hydrate given her tachycardia.  We will treat pain while awaiting results.  Husband agreeable to plan of care.  Patient has a nondisplaced left humeral neck fracture.  Will place in a shoulder  immobilizer/sling.  Patient has a suspicious right knee x-ray but no definitive fracture we will follow up with a CT scan to rule out fracture.    CT scan of the knee shows medial tibial plateau fracture.  Patient continues to be in what appears to be atrial fibrillation with rapid ventricular response currently around 135 bpm.  We will start the patient on a diltiazem infusion and admit to the hospital service for further work-up and treatment.  No known history of A. fib, chart review shows no history of A. fib.  DANNIELLA ROBBEN was evaluated in Emergency Department on 01/06/2021 for the symptoms described in the history of present illness. She was evaluated in the context of the global COVID-19 pandemic, which necessitated consideration that the patient might be at risk for infection with the SARS-CoV-2 virus that causes COVID-19. Institutional protocols and algorithms that pertain to the evaluation of patients at risk for COVID-19 are in a state of rapid change based on information released by regulatory bodies including the CDC and federal and state organizations. These policies and algorithms were followed during the patient's care in the ED.  CRITICAL CARE Performed by: Harvest Dark   Total critical care time: 30 minutes  Critical care time was exclusive of separately billable procedures and treating other patients.  Critical care was necessary to treat or prevent imminent or life-threatening deterioration.  Critical care was time spent personally by me on the following activities: development of treatment plan with patient and/or surrogate as well as nursing, discussions with consultants, evaluation of patient's response to treatment, examination of patient, obtaining history from patient or surrogate, ordering and performing treatments and interventions, ordering and review of laboratory studies, ordering and review of radiographic studies, pulse oximetry and re-evaluation of patient's  condition.   ____________________________________________   FINAL CLINICAL IMPRESSION(S) / ED DIAGNOSES  Fall Left humeral neck fracture Medial tibial plateau fracture (right) Atrial fibrillation with rapid ventricular response   Harvest Dark, MD 01/06/21 2002

## 2021-01-06 NOTE — H&P (Addendum)
[place]   PATIENT NAME: Haley Kim    MR#:  [id number]  DATE OF BIRTH:  06/28/1946  DATE OF ADMISSION:  01/06/2021  PRIMARY CARE PHYSICIAN: Adin Hector, MD   Patient is coming from: Home REQUESTING/REFERRING PHYSICIAN: Harvest Dark, MD  CHIEF COMPLAINT:   Chief Complaint  Patient presents with  . Fall    HISTORY OF PRESENT ILLNESS:  Haley Kim is a 75 y.o. Caucasian female with medical history significant for COPD, dyslipidemia and dementia, who presented to the emergency room with acute onset of unwitnessed fall at home with subsequent left shoulder and right knee pain.  She denied any loss of consciousness.  She was unsure what she fell.  She was tachycardic when she came to the ER with a heart rate 130 beats per minutes and was noted to be in atrial fibrillation with RVR with no previous history of A. fib.  No reported chest pain.  No cough or wheezing or hemoptysis.  No fever or chills.  No nausea vomiting or abdominal pain.  No dysuria, oliguria or hematuria or flank pain. ED Course: Blood pressure was 97/78 initially with heart rate of 72 then up to 130 with respiratory rate that was normal and later 29 then 34 and pulse currently is normal on room air.  Labs revealed neck cytosis of 14.9 and later 17.2 with hemoglobin/hematocrit of 12/37 and later 9.1/27.5. EKG as reviewed by me :  showed atrial fibrillation with a ventricular sponsor of 164 with right axis deviation and pulmonary disease pattern. Imaging: Right knee x-ray showed no definite fracture of her CT was recommended and revealed right medial tibial plateau impacted fracture.  It showed moderate size lipohemarthrosis and osteoporosis.  Noncontrasted head CT scan and C-spine CT showed no acute abnormalities.  Left shoulder x-ray showed nondisplaced left humeral neck fracture.  The patient was given 50 mcg of IV fentanyl, IV Cardizem infusion, 4 mg of IV morphine sulfate and 4 mg of IV  Zofran as well as 1 L bolus of IV normal saline. PAST MEDICAL HISTORY:   Past Medical History:  Diagnosis Date  . Allergy   . Anxiety   . Chronic cough   . Chronic headache   . COPD (chronic obstructive pulmonary disease) ([place])   . Dehydration   . Disorders of bilirubin excretion   . Loss of appetite   . Loss of weight   . Memory loss   . NSVD (normal spontaneous vaginal delivery)    x 1  . Pure hypercholesterolemia   . 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 Weight loss   . Wrist fracture 01/2004   fell on tennis court, muscle problem caused  fall    PAST SURGICAL HISTORY:   Past Surgical History:  Procedure Laterality Date  . BREAST LUMPECTOMY     bilateral, age 76  . DILATATION & CURETTAGE/HYSTEROSCOPY WITH MYOSURE N/A 07/18/2018   Procedure: DILATATION & CURETTAGE/HYSTEROSCOPY WITH MYOSURE;  Surgeon: Schermerhorn, Gwen Her, MD;  Location: [facility] ORS;  Service: Gynecology;  Laterality: N/A;  . HYSTEROSCOPY WITH D & C N/A 07/18/2018   Procedure: DILATATION AND CURETTAGE /HYSTEROSCOPY;  Surgeon: Schermerhorn, Gwen Her, MD;  Location: [facility] ORS;  Service: Gynecology;  Laterality: N/A;  . TUBAL LIGATION      SOCIAL HISTORY:   Social History   Tobacco Use  . Smoking status: Never Smoker  . Smokeless tobacco: Never Used  Substance Use Topics  .  Alcohol use: No    FAMILY HISTORY:   Family History  Problem Relation Age of Onset  . Alzheimer's disease Mother   . Alzheimer's disease Father   . Stroke Father        x 5  . Heart disease Father   . Cancer Paternal Aunt        throat  . Cancer Paternal Grandmother        throat  . Diabetes Neg Hx   . Alcohol abuse Neg Hx   . Colon cancer Neg Hx     DRUG ALLERGIES:   Allergies  Allergen Reactions  . Donepezil     hallucinations  . Dronabinol   . Mirtazapine     hallucinations    REVIEW OF SYSTEMS:   ROS As per history of present illness. All pertinent systems were reviewed above.  Constitutional, HEENT, cardiovascular, respiratory, GI, GU, musculoskeletal, neuro, psychiatric, endocrine, integumentary and hematologic systems were reviewed and are otherwise negative/unremarkable except for positive findings mentioned above in the HPI.   MEDICATIONS AT HOME:   Prior to Admission medications   Medication Sig Start Date End Date Taking? Authorizing Provider  Calcium Carbonate (CALCIUM 600 PO) Take 1 tablet by mouth daily.    [provider]  Cholecalciferol (VITAMIN D3) 2000 UNITS TABS Take by mouth as directed.    [provider]  Coenzyme Q10 50 MG CAPS Take 120 mg by mouth daily.     [provider]  donepezil (ARICEPT) 5 MG tablet Take by mouth. 02/18/16 02/17/17  [provider]  dronabinol (MARINOL) 2.5 MG capsule  03/03/16   [provider]  escitalopram (LEXAPRO) 5 MG tablet Take 5 mg by mouth daily.    [provider]  MAGNESIUM PO Take 400 mg by mouth as directed.     [provider]  mirtazapine (REMERON) 7.5 MG tablet Take 2 tablets (15 mg total) by mouth at bedtime. 05/14/16   Marcial Pacas, MD  Multiple Vitamin (MULTIVITAMIN) tablet Take 1 tablet by mouth daily.      [provider]  Probiotic Product (PROBIOTIC PO) Take by mouth daily.      [provider]  vitamin C (ASCORBIC ACID) 500 MG tablet Take 1,000 mg by mouth daily.     [provider]  vitamin E 400 UNIT capsule Take 400 Units by mouth daily.      [provider]      VITAL SIGNS:  Blood pressure 101/76, pulse (!) 109, temperature 98 F (36.7 C), resp. rate (!) 24, weight 31.8 kg, SpO2 100 %.  PHYSICAL EXAMINATION:  Physical Exam  GENERAL:  75 y.o.-year-old Caucasian female patient lying in the bed with no acute distress.  EYES: Pupils equal, round, reactive to light and accommodation. No scleral icterus. Extraocular muscles intact.  HEENT: Head atraumatic, normocephalic. Oropharynx and nasopharynx  clear.  NECK:  Supple, no jugular venous distention. No thyroid enlargement, no tenderness.  LUNGS: Normal breath sounds bilaterally, no wheezing, rales,rhonchi or crepitation. No use of accessory muscles of respiration.  CARDIOVASCULAR: Irregularly irregular tachycardic rhythm.  S1, S2 normal. No murmurs, rubs, or gallops.  ABDOMEN: Soft, nondistended, nontender. Bowel sounds present. No organomegaly or mass.  EXTREMITIES: No pedal edema, cyanosis, or clubbing.  NEUROLOGIC: Cranial nerves II through XII are intact. Muscle strength 5/5 in all extremities. Sensation intact. Gait not checked. Musculoskeletal: Right upper leg and knee tenderness.  Left upper humerus tenderness. PSYCHIATRIC: The patient is alert and oriented x 3.  Normal affect and good eye contact. SKIN: No obvious rash, lesion, or ulcer.   LABORATORY PANEL:   CBC Recent Labs  Lab 01/06/21 2041  WBC 17.2*  HGB 9.1*  HCT 27.5*  PLT 139*   ------------------------------------------------------------------------------------------------------------------  Chemistries  Recent Labs  Lab 01/06/21 1548  NA 143  K 4.0  CL 105  CO2 25  GLUCOSE 171*  BUN 15  CREATININE 1.17*  CALCIUM 9.0   ------------------------------------------------------------------------------------------------------------------  Cardiac Enzymes No results for input(s): TROPONINI in the last 168 hours. ------------------------------------------------------------------------------------------------------------------  RADIOLOGY:  DG Pelvis 1-2 Views  Result Date: 01/06/2021 CLINICAL DATA:  Unwitnessed fall at home. EXAM: PELVIS - 1-2 VIEW COMPARISON:  None. FINDINGS: Both hips are normally located. No definite acute hip fracture. The pubic symphysis and SI joints are intact. No definite pelvic fractures. IMPRESSION: No acute bony findings. Electronically Signed   By: Marijo Sanes M.D.   On: 01/06/2021 17:21   CT Head Wo Contrast  Result Date:  01/06/2021 CLINICAL DATA:  Patient found down on floor by has bone, unknown mechanism. Patient endorses neck and back pain. EXAM: CT HEAD WITHOUT CONTRAST CT CERVICAL SPINE WITHOUT CONTRAST TECHNIQUE: Multidetector CT imaging of the head and cervical spine was performed following the standard protocol without intravenous contrast. Multiplanar CT image reconstructions of the cervical spine were also generated. COMPARISON:  None. FINDINGS: CT HEAD FINDINGS Study is slightly motion degraded. Brain: No evidence of acute infarction, hemorrhage, hydrocephalus, extra-axial collection or mass lesion/mass effect. Global parenchymal volume loss with ex vacuo dilatation of the ventricular system. Mild volume of chronic ischemic white matter disease. 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 Vascular: No hyperdense vessel or unexpected calcification. Skull: Normal. Negative for fracture or focal lesion. Sinuses/Orbits: Paranasal sinuses are clear.  Prior lens surgery. Other: None CT CERVICAL SPINE FINDINGS Study is slightly motion degraded. Alignment: Normal. Skull base and vertebrae: No acute fracture. No primary bone lesion or focal pathologic process. Soft tissues and spinal canal: No prevertebral fluid or swelling. No visible canal hematoma. Disc levels: Multilevel degenerative changes spine with disc space narrowing, disc osteophyte complex ease, uncovertebral and facet hypertrophy, most significant in the lower cervical spine. Upper chest: Biapical scarring. Other: None IMPRESSION: Study is slightly motion degraded.  Within this context: 1.  There is no acute intracranial abnormality 2.  No visualized fracture or subluxation of the cervical spine. Electronically Signed   By: Dahlia Bailiff MD   On: 01/06/2021 16:27   CT Cervical Spine Wo Contrast  Result Date: 01/06/2021 CLINICAL DATA:  Patient found down on floor by has bone, unknown mechanism. Patient endorses neck and back pain. EXAM: CT HEAD WITHOUT CONTRAST  CT CERVICAL SPINE WITHOUT CONTRAST TECHNIQUE: Multidetector CT imaging of the head and cervical spine was performed following the standard protocol without intravenous contrast. Multiplanar CT image reconstructions of the cervical spine were also generated. COMPARISON:  None. FINDINGS: CT HEAD FINDINGS Study is slightly motion degraded. Brain: No evidence of acute infarction, hemorrhage, hydrocephalus, extra-axial collection or mass lesion/mass effect. Global parenchymal volume loss with ex vacuo dilatation of the ventricular system. Mild volume of chronic ischemic white matter disease. 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 Vascular: No hyperdense vessel or unexpected calcification. Skull: Normal. Negative for fracture or focal lesion. Sinuses/Orbits: Paranasal sinuses are clear.  Prior lens surgery. Other: None CT CERVICAL SPINE FINDINGS Study is slightly motion degraded. Alignment: Normal. Skull base and vertebrae: No acute fracture. No primary bone lesion or focal pathologic process.  Soft tissues and spinal canal: No prevertebral fluid or swelling. No visible canal hematoma. Disc levels: Multilevel degenerative changes spine with disc space narrowing, disc osteophyte complex ease, uncovertebral and facet hypertrophy, most significant in the lower cervical spine. Upper chest: Biapical scarring. Other: None IMPRESSION: Study is slightly motion degraded.  Within this context: 1.  There is no acute intracranial abnormality 2.  No visualized fracture or subluxation of the cervical spine. Electronically Signed   By: Dahlia Bailiff MD   On: 01/06/2021 16:27   CT Knee Right Wo Contrast  Result Date: 01/06/2021 CLINICAL DATA:  [place].  Abnormal x-rays. EXAM: CT OF THE right KNEE WITHOUT CONTRAST TECHNIQUE: Multidetector CT imaging of the right knee was performed according to the standard protocol. Multiplanar CT image reconstructions were also generated. COMPARISON:  Right knee radiographs, same date. FINDINGS:  Nondisplaced but slightly impacted medial tibial plateau fracture. Small fracture lines along the intra-articular portion accounting for the lipohemarthrosis. The tibial spines and lateral tibial plateau are intact. No femur, fibula or patella fractures. Advanced changes of osteoporosis. Moderate-sized lipohemarthrosis is noted. Grossly by CT the cruciate and collateral ligaments are intact. The quadriceps and patellar tendons are intact. IMPRESSION: 1. Nondisplaced but slightly impacted medial tibial plateau fracture. 2. Moderate-sized lipohemarthrosis. 3. Advanced changes of osteoporosis. 4. No other fractures are identified. Electronically Signed   By: Marijo Sanes M.D.   On: 01/06/2021 19:06   DG Shoulder Left  Result Date: 01/06/2021 CLINICAL DATA:  Found down at home.  Left shoulder pain. EXAM: LEFT SHOULDER - 2+ VIEW COMPARISON:  None. FINDINGS: Nondisplaced fracture noted through the humeral neck and probably involving the base of the greater tuberosity. The [facility] joint is intact. No clavicle fracture. The visualized left ribs are intact and the visualized left lung is grossly clear. IMPRESSION: Nondisplaced left humeral neck fracture. Electronically Signed   By: Marijo Sanes M.D.   On: 01/06/2021 17:17   DG Knee Complete 4 Views Right  Result Date: 01/06/2021 CLINICAL DATA:  Found down at home.  Right knee pain. EXAM: RIGHT KNEE - COMPLETE 4+ VIEW COMPARISON:  None FINDINGS: The joint spaces are maintained. No acute fracture is identified. However, there is a lipohemarthrosis noted on the lateral film, highly suggestive of fracture. Recommend CT for further evaluation. IMPRESSION: No definite acute fracture. However, suspect lipohemarthrosis highly suggestive of fracture. CT is recommended for further evaluation. Electronically Signed   By: Marijo Sanes M.D.   On: 01/06/2021 17:20      IMPRESSION AND PLAN:  Active Problems:   Atrial fibrillation with RVR (HCC)  1.  Atrial fibrillation with  rapid ventricle response. -The patient will be admitted to progressive unit bed. -We will continue IV Cardizem infusion and use boluses as needed. -2D echo and cardiology consultation will be obtained. -I notified Dr. Clayborn Bigness about the patient. -We will obtain magnesium level as well as TSH. -The patient CHA2DS2-VASc score is 3 I will therefore start her on p.o. Eliquis given the likelihood of conservative management for her fractures.  2.  Mechanical fall with subsequent right medial tibial plateau fracture and left humeral neck fracture. -Pain management will be provided. -Orthopedic consultation will be obtained. -Dr. Roland Rack was notified about patient.  3.  SIRS with tachycardia, tachypnea and leukocytosis, rule out sepsis. -The patient was started on broad-spectrum IV antibiotic therapy with cefepime, vancomycin and Flagyl especially given mild associated hypotension initially that resolved with hydration.   -Urinalysis was ordered and is pending as well as  urine and blood culture.  4.  Anemia, likely dilutional. -We will continue monitoring with serial hemoglobins and hematocrits.  5.  Depression. -We will continue Lexapro.  6.  Dementia. -We will continue Aricept.  DVT prophylaxis: Eliquis. Code Status: full code Family Communication:  The plan of care was discussed in details with the patient (and family). I answered all questions. The patient agreed to proceed with the above mentioned plan. Further management will depend upon hospital course. Disposition Plan: Back to previous home environment Consults called: Cardiology and orthopedic consults as above. All the records are reviewed and case discussed with ED provider.  Status is: Inpatient  Remains inpatient appropriate because:Ongoing active pain requiring inpatient pain management, Ongoing diagnostic testing needed not appropriate for outpatient work up, Unsafe d/c plan, IV treatments appropriate due to intensity of  illness or inability to take PO and Inpatient level of care appropriate due to severity of illness   Dispo: The patient is from: Home              Anticipated d/c is to: SNF              Anticipated d/c date is: 3 days              Patient currently is not medically stable to d/c.   Difficult to place patient No   TOTAL TIME TAKING CARE OF THIS PATIENT: 55 minutes.    Christel Mormon M.D on 01/06/2021 at 9:09 PM  [facility]   From 7 PM-7 AM, contact night-coverage [email or web address]  CC: Primary care physician; Adin Hector, MD

## 2021-01-06 NOTE — ED Notes (Signed)
Patient transported to CT 

## 2021-01-06 NOTE — ED Triage Notes (Addendum)
Pt comes via EMS from home with c/o unwitnessed fall. Pt was found by husband on floor. Husband heard pt fall. Pt states neck and back pain. Pt states left arm and shoulder pain.  Pt states 10/10 pain. Pt states pain to right leg below knee.  Pt denies any blood thinners. Pt unsure if she hit her head. Pt has hx of dementia. Pt fell onto padded carpet per husband.  Pt placed in neck collar at this time.

## 2021-01-07 ENCOUNTER — Encounter: Payer: Self-pay | Admitting: Family Medicine

## 2021-01-07 ENCOUNTER — Inpatient Hospital Stay
Admit: 2021-01-07 | Discharge: 2021-01-07 | Disposition: A | Discharge status: Home / Self Care | Payer: Medicare Other | Attending: Family Medicine | Admitting: Family Medicine

## 2021-01-07 ENCOUNTER — Inpatient Hospital Stay: Payer: Medicare Other

## 2021-01-07 DIAGNOSIS — W19XXXD Unspecified fall, subsequent encounter: Secondary | ICD-10-CM

## 2021-01-07 DIAGNOSIS — S82141A Displaced bicondylar fracture of right tibia, initial encounter for closed fracture: Secondary | ICD-10-CM | POA: Diagnosis present

## 2021-01-07 DIAGNOSIS — S82141D Displaced bicondylar fracture of right tibia, subsequent encounter for closed fracture with routine healing: Secondary | ICD-10-CM

## 2021-01-07 DIAGNOSIS — S42302A Unspecified fracture of shaft of humerus, left arm, initial encounter for closed fracture: Secondary | ICD-10-CM | POA: Diagnosis present

## 2021-01-07 DIAGNOSIS — Y92009 Unspecified place in unspecified non-institutional (private) residence as the place of occurrence of the external cause: Secondary | ICD-10-CM

## 2021-01-07 DIAGNOSIS — W19XXXA Unspecified fall, initial encounter: Secondary | ICD-10-CM

## 2021-01-07 DIAGNOSIS — D62 Acute posthemorrhagic anemia: Secondary | ICD-10-CM

## 2021-01-07 LAB — CBC
HCT: 21.6 % — ABNORMAL LOW (ref 36.0–46.0)
Hemoglobin: 7.1 g/dL — ABNORMAL LOW (ref 12.0–15.0)
MCH: 33.6 pg (ref 26.0–34.0)
MCHC: 32.9 g/dL (ref 30.0–36.0)
MCV: 102.4 fL — ABNORMAL HIGH (ref 80.0–100.0)
Platelets: 103 10*3/uL — ABNORMAL LOW (ref 150–400)
RBC: 2.11 MIL/uL — ABNORMAL LOW (ref 3.87–5.11)
RDW: 14 % (ref 11.5–15.5)
WBC: 13.9 10*3/uL — ABNORMAL HIGH (ref 4.0–10.5)
nRBC: 0 % (ref 0.0–0.2)

## 2021-01-07 LAB — BASIC METABOLIC PANEL
Anion gap: 6 (ref 5–15)
BUN: 16 mg/dL (ref 8–23)
CO2: 22 mmol/L (ref 22–32)
Calcium: 6.7 mg/dL — ABNORMAL LOW (ref 8.9–10.3)
Chloride: 116 mmol/L — ABNORMAL HIGH (ref 98–111)
Creatinine, Ser: 0.75 mg/dL (ref 0.44–1.00)
GFR, Estimated: >60 mL/min (ref >60)
Glucose, Bld: 146 mg/dL — ABNORMAL HIGH (ref 70–99)
Potassium: 3.8 mmol/L (ref 3.5–5.1)
Sodium: 144 mmol/L (ref 135–145)

## 2021-01-07 LAB — LIPID PANEL
Cholesterol: 159 mg/dL (ref 0–200)
HDL: 66 mg/dL (ref >40)
LDL Cholesterol: 84 mg/dL (ref 0–99)
Total CHOL/HDL Ratio: 2.4 RATIO
Triglycerides: 44 mg/dL (ref <150)
VLDL: 9 mg/dL (ref 0–40)

## 2021-01-07 LAB — CORTISOL: Cortisol, Plasma: 18.8 ug/dL

## 2021-01-07 LAB — VITAMIN B12: Vitamin B-12: 606 pg/mL (ref 180–914)

## 2021-01-07 LAB — HEMOGLOBIN AND HEMATOCRIT, BLOOD
HCT: 18.8 % — ABNORMAL LOW (ref 36.0–46.0)
Hemoglobin: 6 g/dL — ABNORMAL LOW (ref 12.0–15.0)

## 2021-01-07 LAB — PREALBUMIN: Prealbumin: 18 mg/dL (ref 18–38)

## 2021-01-07 LAB — LACTIC ACID, PLASMA: Lactic Acid, Venous: 1.8 mmol/L (ref 0.5–1.9)

## 2021-01-07 LAB — PREPARE RBC (CROSSMATCH)

## 2021-01-07 LAB — IRON AND TIBC
Iron: 15 ug/dL — ABNORMAL LOW (ref 28–170)
Saturation Ratios: 8 % — ABNORMAL LOW (ref 10.4–31.8)
TIBC: 182 ug/dL — ABNORMAL LOW (ref 250–450)
UIBC: 167 ug/dL

## 2021-01-07 LAB — FERRITIN: Ferritin: 141 ng/mL (ref 11–307)

## 2021-01-07 LAB — TSH: TSH: 1.344 u[IU]/mL (ref 0.350–4.500)

## 2021-01-07 LAB — MAGNESIUM: Magnesium: 1.6 mg/dL — ABNORMAL LOW (ref 1.7–2.4)

## 2021-01-07 MED ORDER — SODIUM CHLORIDE 0.9% IV SOLUTION: Freq: Once | INTRAVENOUS | Status: DC

## 2021-01-07 MED ORDER — MORPHINE SULFATE (PF) 2 MG/ML IV SOLN
2.0000 mg | INTRAVENOUS | Status: DC | PRN
  Administered 2021-01-08 – 2021-01-09 (×3): 2 mg via INTRAVENOUS
  Filled 2021-01-07 (×3): qty 1

## 2021-01-07 MED ORDER — OXYCODONE HCL 5 MG PO TABS
5.0000 mg | ORAL_TABLET | Freq: Four times a day (QID) | ORAL | Status: DC | PRN
  Administered 2021-01-07 – 2021-01-16 (×9): 5 mg via ORAL
  Filled 2021-01-07 (×11): qty 1

## 2021-01-07 MED ORDER — SODIUM CHLORIDE 0.9 % IV BOLUS
500.0000 mL | Freq: Once | INTRAVENOUS | Status: AC
  Administered 2021-01-07: 500 mL via INTRAVENOUS

## 2021-01-07 MED ORDER — POTASSIUM CHLORIDE CRYS ER 20 MEQ PO TBCR
40.0000 meq | EXTENDED_RELEASE_TABLET | Freq: Once | ORAL | Status: AC
  Administered 2021-01-07: 40 meq via ORAL
  Filled 2021-01-07: qty 2

## 2021-01-07 MED ORDER — MAGNESIUM SULFATE 2 GM/50ML IV SOLN
2.0000 g | Freq: Once | INTRAVENOUS | Status: AC
  Administered 2021-01-07: 2 g via INTRAVENOUS
  Filled 2021-01-07: qty 50

## 2021-01-07 MED ORDER — SODIUM CHLORIDE 0.9% IV SOLUTION
Freq: Once | INTRAVENOUS | Status: DC
  Filled 2021-01-07: qty 250

## 2021-01-07 MED ORDER — HALOPERIDOL LACTATE 5 MG/ML IJ SOLN
1.0000 mg | Freq: Four times a day (QID) | INTRAMUSCULAR | Status: AC | PRN
  Administered 2021-01-10 (×2): 1 mg via INTRAVENOUS
  Filled 2021-01-07 (×2): qty 1

## 2021-01-07 MED ORDER — VANCOMYCIN HCL 500 MG/100ML IV SOLN: 500.0000 mg | INTRAVENOUS | Status: DC

## 2021-01-07 NOTE — ED Notes (Signed)
Informed attending L wrist now appears red, swollen and pt is holding in position of comfort.

## 2021-01-07 NOTE — Progress Notes (Signed)
   01/07/21 1133  Assess: MEWS Score  Temp 98.1 F (36.7 C)  BP (!) 133/55  ECG Heart Rate (!) 113  Resp 20  SpO2 97 %  O2 Device Room Air  Assess: MEWS Score  MEWS Temp 0  MEWS Systolic 0  MEWS Pulse 2  MEWS RR 0  MEWS LOC 0  MEWS Score 2  MEWS Score Color Yellow  Assess: if the MEWS score is Yellow or Red  Were vital signs taken at a resting state? Yes  Focused Assessment No change from prior assessment  Early Detection of Sepsis Score *See Row Information* High  MEWS guidelines implemented *See Row Information* Yes  Treat  MEWS Interventions Administered scheduled meds/treatments  Pain Scale 0-10  Pain Score 0  Take Vital Signs  Increase Vital Sign Frequency  Yellow: Q 2hr X 2 then Q 4hr X 2, if remains yellow, continue Q 4hrs  Escalate  MEWS: Escalate Yellow: discuss with charge nurse/RN and consider discussing with provider and RRT  Notify: Charge Nurse/RN  Name of Charge Nurse/RN Notified Ria Comment RN  Date Charge Nurse/RN Notified 01/07/21  Time Charge Nurse/RN Notified 1133  Document  Progress note created (see row info) Yes   Patient came from ED in Afib RVR, patient was on cardizem drip in the ED.  Caredizem drip was restarted upon arrival to the floor, cardizem drip will be titrated per parameters.  In depth conversation was had with charge nurse Ria Comment.  Yellow MEWS protocol was started.  Will continue to monitor.

## 2021-01-07 NOTE — Progress Notes (Signed)
*  PRELIMINARY RESULTS* Echocardiogram 2D Echocardiogram has been performed.  Sherrie Sport 01/07/2021, 9:45 AM

## 2021-01-07 NOTE — Progress Notes (Signed)
Rodena Piety., RN made aware of diltiazem titration to 10mg /hr. He verbalized understanding of all information.

## 2021-01-07 NOTE — Progress Notes (Addendum)
Progress Note    Haley Kim  OJJ:[id number] DOB: August 18, 1946  DOA: 01/06/2021 PCP: Adin Hector, MD      Brief Narrative:    Medical records reviewed and are as summarized below:  Haley Kim is a 75 y.o. female COPD, dyslipidemia, dementia, who presented to the hospital after a fall at home.  Reportedly, there was no loss of consciousness.  In the ED, she was tachycardic and she was found to have atrial fibrillation with RVR.  Work-up revealed left humeral neck fracture and right medial tibial plateau fracture complicated by moderate-sized lipohemarthrosis.      Assessment/Plan:   Principal Problem:   Fall at home Active Problems:   Atrial fibrillation with RVR (HCC)   Left humeral fracture   Tibial plateau fracture, right   Acute blood loss anemia   Body mass index is 11.21 kg/m.  (Underweight): This complicates overall prognosis.  Consult dietitian.    S/p fall, left humeral neck fracture, right medial tibial plateau fracture, moderate sized lipohemarthrosis: Analgesics as needed for pain.  Follow-up with orthopedic surgeon for further recommendations.  PT and OT evaluation.  Atrial fibrillation with RVR: Continue IV Cardizem infusion.  Discontinue Eliquis for now because of severe anemia and lipohemarthrosis.  Follow-up with cardiologist for further recommendations.  Acute blood loss anemia: Hemoglobin dropped from 12 to 6.  Transfuse 2 units of packed red blood cells.  Risks and benefits of blood transfusion were discussed with the patient and her husband at the bedside.  They are agreeable to blood transfusion.  SIRS: No evidence of infection at this time.  Discontinue empiric IV antibiotics.  Hypomagnesemia: replete magnesium and monitor level.  Potassium level is 3.8 but will replete to keep potassium level above 4.  Dementia: Continue Aricept  Plan discussed with the husband at the bedside.   Diet Order            Diet Heart Room  service appropriate? Yes; Fluid consistency: Thin  Diet effective now                    Consultants:  Cardiologist  Orthopedic surgeon  Procedures:  None    Medications:   . sodium chloride   Intravenous Once  . sodium chloride   Intravenous Once  . acidophilus   Oral Daily  . vitamin C  1,000 mg Oral Daily  . calcium carbonate  1,250 mg Oral Q breakfast  . cholecalciferol  2,000 Units Oral Daily  . donepezil  5 mg Oral QHS  . dronabinol  2.5 mg Oral QAC lunch  . escitalopram  5 mg Oral Daily  . magnesium oxide  400 mg Oral Daily  . multivitamin with minerals  1 tablet Oral Daily  . potassium chloride  40 mEq Oral Once  . vitamin E  400 Units Oral Daily   Continuous Infusions: . sodium chloride Stopped (01/07/21 0708)  . diltiazem (CARDIZEM) infusion 5 mg/hr (01/07/21 1155)  . magnesium sulfate bolus IVPB 2 g (01/07/21 1341)     Anti-infectives (From admission, onward)   Start     Dose/Rate Route Frequency Ordered Stop   01/08/21 1000  vancomycin (VANCOREADY) IVPB 500 mg/100 mL  Status:  Discontinued        500 mg 100 mL/hr over 60 Minutes Intravenous Every 36 hours 01/07/21 1217 01/07/21 1410   01/07/21 2100  ceFEPIme (MAXIPIME) 2 g in sodium chloride 0.9 % 100 mL IVPB  Status:  Discontinued        2 g 200 mL/hr over 30 Minutes Intravenous Every 24 hours 01/06/21 2041 01/07/21 1410   01/07/21 2100  vancomycin (VANCOREADY) IVPB 500 mg/100 mL  Status:  Discontinued        500 mg 100 mL/hr over 60 Minutes Intravenous Every 48 hours 01/06/21 2041 01/07/21 1217   01/06/21 2130  ceFEPIme (MAXIPIME) 2 g in sodium chloride 0.9 % 100 mL IVPB  Status:  Discontinued        2 g 200 mL/hr over 30 Minutes Intravenous  Once 01/06/21 2129 01/06/21 2148   01/06/21 2130  metroNIDAZOLE (FLAGYL) IVPB 500 mg  Status:  Discontinued        500 mg 100 mL/hr over 60 Minutes Intravenous Every 8 hours 01/06/21 2129 01/06/21 2148   01/06/21 2130  vancomycin (VANCOCIN) IVPB 1000  mg/200 mL premix  Status:  Discontinued        1,000 mg 200 mL/hr over 60 Minutes Intravenous  Once 01/06/21 2129 01/06/21 2149   01/06/21 2030  ceFEPIme (MAXIPIME) 2 g in sodium chloride 0.9 % 100 mL IVPB        2 g 200 mL/hr over 30 Minutes Intravenous  Once 01/06/21 2017 01/06/21 2250   01/06/21 2030  metroNIDAZOLE (FLAGYL) IVPB 500 mg  Status:  Discontinued        500 mg 100 mL/hr over 60 Minutes Intravenous Every 8 hours 01/06/21 2017 01/07/21 1410   01/06/21 2030  vancomycin (VANCOREADY) IVPB 750 mg/150 mL        750 mg 150 mL/hr over 60 Minutes Intravenous  Once 01/06/21 2017 01/07/21 0029             Family Communication/Anticipated D/C date and plan/Code Status   DVT prophylaxis: SCDs Start: 01/06/21 2125     Code Status: Full Code  Family Communication: With husband at the bedside Disposition Plan:    Status is: Inpatient  Remains inpatient appropriate because:IV treatments appropriate due to intensity of illness or inability to take PO and Inpatient level of care appropriate due to severity of illness   Dispo: The patient is from: Home              Anticipated d/c is to: Home              Anticipated d/c date is: 3 days              Patient currently is not medically stable to d/c.   Difficult to place patient No           Subjective:   Interval events noted.  She complains of pain in the left shoulder and right knee.  No bloody urine, hematemesis or bloody stools.  Objective:    Vitals:   01/07/21 1133 01/07/21 1200 01/07/21 1245 01/07/21 1351  BP: (!) 133/55 128/77 127/68 (!) 120/95  Pulse:  (!) 109    Resp: 20   20  Temp: 98.1 F (36.7 C)   98.7 F (37.1 C)  TempSrc: Oral   Oral  SpO2: 97% 95%  95%  Weight:      Height:       No data found.   Intake/Output Summary (Last 24 hours) at 01/07/2021 1412 Last data filed at 01/07/2021 0708 Gross per 24 hour  Intake 650 ml  Output --  Net 650 ml   Filed Weights   01/06/21 1547   Weight: 31.8 kg    Exam:  GEN: NAD SKIN:  No rash EYES: EOMI ENT: MMM CV: Irregular rate tachycardic PULM: CTA B ABD: soft, ND, NT, +BS CNS: AAO x 3, non focal EXT: Left arm is in a sling.  Mild right knee swelling and tenderness.     Data Reviewed:   I have personally reviewed following labs and imaging studies:  Labs: Labs show the following:   Basic Metabolic Panel: Recent Labs  Lab 01/06/21 1548 01/06/21 2041 01/07/21 0520  NA 143 144 144  K 4.0 3.7 3.8  CL 105 110 116*  CO2 25 26 22   GLUCOSE 171* 151* 146*  BUN 15 16 16   CREATININE 1.17* 1.08* 0.75  CALCIUM 9.0 8.1* 6.7*  MG  --   --  1.6*   GFR Estimated Creatinine Clearance: 29.6 mL/min (by C-G formula based on SCr of 0.75 mg/dL). Liver Function Tests: Recent Labs  Lab 01/06/21 2041  AST 23  ALT 21  ALKPHOS 23*  BILITOT 1.1  PROT 5.4*  ALBUMIN 3.5   No results for input(s): LIPASE, AMYLASE in the last 168 hours. No results for input(s): AMMONIA in the last 168 hours. Coagulation profile Recent Labs  Lab 01/06/21 2041  INR 1.2    CBC: Recent Labs  Lab 01/06/21 1548 01/06/21 2041 01/07/21 0520 01/07/21 1019  WBC 14.9* 17.2* 13.9*  --   NEUTROABS  --  15.6*  --   --   HGB 12.0 9.1* 7.1* 6.0*  HCT 37.0 27.5* 21.6* 18.8*  MCV 101.1* 100.7* 102.4*  --   PLT 175 139* 103*  --    Cardiac Enzymes: No results for input(s): CKTOTAL, CKMB, CKMBINDEX, TROPONINI in the last 168 hours. BNP (last 3 results) No results for input(s): PROBNP in the last 8760 hours. CBG: No results for input(s): GLUCAP in the last 168 hours. D-Dimer: No results for input(s): DDIMER in the last 72 hours. Hgb A1c: No results for input(s): HGBA1C in the last 72 hours. Lipid Profile: Recent Labs    01/07/21 0520  CHOL 159  HDL 66  LDLCALC 84  TRIG 44  CHOLHDL 2.4   Thyroid function studies: Recent Labs    01/07/21 0520  TSH 1.344   Anemia work up: No results for input(s): VITAMINB12, FOLATE,  FERRITIN, TIBC, IRON, RETICCTPCT in the last 72 hours. Sepsis Labs: Recent Labs  Lab 01/06/21 1548 01/06/21 2041 01/07/21 0520  PROCALCITON  --  0.29  --   WBC 14.9* 17.2* 13.9*  LATICACIDVEN  --  2.3* 1.8    Microbiology Recent Results (from the past 240 hour(s))  SARS Coronavirus 2 by RT PCR (hospital order, performed in [facility] hospital lab) Nasopharyngeal Nasopharyngeal Swab     Status: None   Collection Time: 01/06/21  8:41 PM   Specimen: Nasopharyngeal Swab  Result Value Ref Range Status   SARS Coronavirus 2 NEGATIVE NEGATIVE Final    Comment: (NOTE) SARS-CoV-2 target nucleic acids are NOT DETECTED.  The SARS-CoV-2 RNA is generally detectable in upper and lower respiratory specimens during the acute phase of infection. The lowest concentration of SARS-CoV-2 viral copies this assay can detect is 250 copies / mL. A negative result does not preclude SARS-CoV-2 infection and should not be used as the sole basis for treatment or other patient management decisions.  A negative result may occur with improper specimen collection / handling, submission of specimen other than nasopharyngeal swab, presence of viral mutation(s) within the areas targeted by this assay, and inadequate number of viral copies (<250 copies / mL). A negative  result must be combined with clinical observations, patient history, and epidemiological information.  Fact Sheet for Patients:   StrictlyIdeas.no  Fact Sheet for Healthcare Providers: BankingDealers.co.za  This test is not yet approved or  cleared by the [place] FDA and has been authorized for detection and/or diagnosis of SARS-CoV-2 by FDA under an Emergency Use Authorization (EUA).  This EUA will remain in effect (meaning this test can be used) for the duration of the COVID-19 declaration under Section 564(b)(1) of the Act, 21 U.S.C. section 360bbb-3(b)(1), unless the authorization is  terminated or revoked sooner.  Performed at [facility], [address] [place], [place] [address]   Culture, blood (x 2)     Status: None (Preliminary result)   Collection Time: 01/06/21  8:41 PM   Specimen: BLOOD  Result Value Ref Range Status   Specimen Description BLOOD LEFT ANTECUBITAL  Final   Special Requests   Final    BOTTLES DRAWN AEROBIC AND ANAEROBIC Blood Culture adequate volume   Culture   Final    NO GROWTH < 12 HOURS Performed at [facility], [address], [place], [place] [address]    Report Status PENDING  Incomplete  Culture, blood (x 2)     Status: None (Preliminary result)   Collection Time: 01/06/21  8:41 PM   Specimen: BLOOD  Result Value Ref Range Status   Specimen Description BLOOD RIGHT ANTECUBITAL  Final   Special Requests   Final    BOTTLES DRAWN AEROBIC AND ANAEROBIC Blood Culture adequate volume   Culture   Final    NO GROWTH < 12 HOURS Performed at [facility], [address] [place],  [address]    Report Status PENDING  Incomplete    Procedures and diagnostic studies:  DG Pelvis 1-2 Views  Result Date: 01/06/2021 CLINICAL DATA:  Unwitnessed fall at home. EXAM: PELVIS - 1-2 VIEW COMPARISON:  None. FINDINGS: Both hips are normally located. No definite acute hip fracture. The pubic symphysis and SI joints are intact. No definite pelvic fractures. IMPRESSION: No acute bony findings. Electronically Signed   By: Marijo Sanes M.D.   On: 01/06/2021 17:21   CT Head Wo Contrast  Result Date: 01/06/2021 CLINICAL DATA:  Patient found down on floor by has bone, unknown mechanism. Patient endorses neck and back pain. EXAM: CT HEAD WITHOUT CONTRAST CT CERVICAL SPINE WITHOUT CONTRAST TECHNIQUE: Multidetector CT imaging of the head and cervical spine was performed following the standard protocol without intravenous contrast. Multiplanar CT image reconstructions of the cervical spine were also generated. COMPARISON:   None. FINDINGS: CT HEAD FINDINGS Study is slightly motion degraded. Brain: No evidence of acute infarction, hemorrhage, hydrocephalus, extra-axial collection or mass lesion/mass effect. Global parenchymal volume loss with ex vacuo dilatation of the ventricular system. Mild volume of chronic ischemic white matter disease. 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 Vascular: No hyperdense vessel or unexpected calcification. Skull: Normal. Negative for fracture or focal lesion. Sinuses/Orbits: Paranasal sinuses are clear.  Prior lens surgery. Other: None CT CERVICAL SPINE FINDINGS Study is slightly motion degraded. Alignment: Normal. Skull base and vertebrae: No acute fracture. No primary bone lesion or focal pathologic process. Soft tissues and spinal canal: No prevertebral fluid or swelling. No visible canal hematoma. Disc levels: Multilevel degenerative changes spine with disc space narrowing, disc osteophyte complex ease, uncovertebral and facet hypertrophy, most significant in the lower cervical spine. Upper chest: Biapical scarring. Other: None IMPRESSION: Study is slightly motion degraded.  Within this context: 1.  There is no acute intracranial abnormality 2.  No visualized fracture or subluxation of the cervical spine. Electronically Signed   By: Dahlia Bailiff MD   On: 01/06/2021 16:27   CT Cervical Spine Wo Contrast  Result Date: 01/06/2021 CLINICAL DATA:  Patient found down on floor by has bone, unknown mechanism. Patient endorses neck and back pain. EXAM: CT HEAD WITHOUT CONTRAST CT CERVICAL SPINE WITHOUT CONTRAST TECHNIQUE: Multidetector CT imaging of the head and cervical spine was performed following the standard protocol without intravenous contrast. Multiplanar CT image reconstructions of the cervical spine were also generated. COMPARISON:  None. FINDINGS: CT HEAD FINDINGS Study is slightly motion degraded. Brain: No evidence of acute infarction, hemorrhage, hydrocephalus, extra-axial  collection or mass lesion/mass effect. Global parenchymal volume loss with ex vacuo dilatation of the ventricular system. Mild volume of chronic ischemic white matter disease. 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 Vascular: No hyperdense vessel or unexpected calcification. Skull: Normal. Negative for fracture or focal lesion. Sinuses/Orbits: Paranasal sinuses are clear.  Prior lens surgery. Other: None CT CERVICAL SPINE FINDINGS Study is slightly motion degraded. Alignment: Normal. Skull base and vertebrae: No acute fracture. No primary bone lesion or focal pathologic process. Soft tissues and spinal canal: No prevertebral fluid or swelling. No visible canal hematoma. Disc levels: Multilevel degenerative changes spine with disc space narrowing, disc osteophyte complex ease, uncovertebral and facet hypertrophy, most significant in the lower cervical spine. Upper chest: Biapical scarring. Other: None IMPRESSION: Study is slightly motion degraded.  Within this context: 1.  There is no acute intracranial abnormality 2.  No visualized fracture or subluxation of the cervical spine. Electronically Signed   By: Dahlia Bailiff MD   On: 01/06/2021 16:27   CT Knee Right Wo Contrast  Result Date: 01/06/2021 CLINICAL DATA:  [place].  Abnormal x-rays. EXAM: CT OF THE right KNEE WITHOUT CONTRAST TECHNIQUE: Multidetector CT imaging of the right knee was performed according to the standard protocol. Multiplanar CT image reconstructions were also generated. COMPARISON:  Right knee radiographs, same date. FINDINGS: Nondisplaced but slightly impacted medial tibial plateau fracture. Small fracture lines along the intra-articular portion accounting for the lipohemarthrosis. The tibial spines and lateral tibial plateau are intact. No femur, fibula or patella fractures. Advanced changes of osteoporosis. Moderate-sized lipohemarthrosis is noted. Grossly by CT the cruciate and collateral ligaments are intact. The quadriceps and  patellar tendons are intact. IMPRESSION: 1. Nondisplaced but slightly impacted medial tibial plateau fracture. 2. Moderate-sized lipohemarthrosis. 3. Advanced changes of osteoporosis. 4. No other fractures are identified. Electronically Signed   By: Marijo Sanes M.D.   On: 01/06/2021 19:06   DG Shoulder Left  Result Date: 01/06/2021 CLINICAL DATA:  Found down at home.  Left shoulder pain. EXAM: LEFT SHOULDER - 2+ VIEW COMPARISON:  None. FINDINGS: Nondisplaced fracture noted through the humeral neck and probably involving the base of the greater tuberosity. The [facility] joint is intact. No clavicle fracture. The visualized left ribs are intact and the visualized left lung is grossly clear. IMPRESSION: Nondisplaced left humeral neck fracture. Electronically Signed   By: Marijo Sanes M.D.   On: 01/06/2021 17:17   DG Knee Complete 4 Views Right  Result Date: 01/06/2021 CLINICAL DATA:  Found down at home.  Right knee pain. EXAM: RIGHT KNEE - COMPLETE 4+ VIEW COMPARISON:  None FINDINGS: The joint spaces are maintained. No acute fracture is identified. However, there is a lipohemarthrosis noted on the lateral film, highly suggestive of  fracture. Recommend CT for further evaluation. IMPRESSION: No definite acute fracture. However, suspect lipohemarthrosis highly suggestive of fracture. CT is recommended for further evaluation. Electronically Signed   By: Marijo Sanes M.D.   On: 01/06/2021 17:20               LOS: 1 day   Setsuko Robins  [facility]   Pager on [email or web address]. If 7PM-7AM, please contact night-coverage at [email or web address]     01/07/2021, 2:12 PM

## 2021-01-07 NOTE — Progress Notes (Signed)
PHARMACIST - PHYSICIAN ORDER COMMUNICATION  CONCERNING: P&T Medication Policy on Herbal Medications  DESCRIPTION:  This patient's order for:  Coenzyme Q10 CAPS 120 mg  has been noted.  This product(s) is classified as an "herbal" or natural product. Due to a lack of definitive safety studies or FDA approval, nonstandard manufacturing practices, plus the potential risk of unknown drug-drug interactions while on inpatient medications, the Pharmacy and Therapeutics Committee does not permit the use of "herbal" or natural products of this type within [facility].   ACTION TAKEN: The pharmacy department is unable to verify and discontinued this order at this time. Please reevaluate patient's clinical condition at discharge and address if the herbal or natural product(s) should be resumed at that time.  Renda Rolls, PharmD, [facility] 01/07/2021 12:11 AM

## 2021-01-07 NOTE — Progress Notes (Signed)
PT Cancellation Note  Patient Details Name: Haley Kim MRN: [id number] DOB: 1946/03/23   Cancelled Treatment:    Reason Eval/Treat Not Completed: Other (comment): Per MD note pt with a mechanical fall with subsequent right medial tibial plateau fracture and left humeral neck fracture with orthopedic consultation pending.  Will hold PT evaluation pending guidance/plan of care update from orthopedic team.   D. Royetta Asal PT, DPT 01/07/21, 8:54 AM

## 2021-01-07 NOTE — Consult Note (Addendum)
CARDIOLOGY CONSULT NOTE               Patient ID: Haley Kim MRN: [id number] DOB/AGE: Aug 26, 1946 75 y.o.  Admit date: 01/06/2021 Referring Physician: Christel Mormon, MD   Primary Physician: Adin Hector, MD  Primary Cardiologist: n/a Reason for Consultation: Atrial fibrillation with RVR  HPI: Haley Kim is a 75 year old female with PMH significant for mixed Alzheimer's and vascular dementia, hypercholesterolemia and COPD who was admitted to [facility] due to an unwitnessed fall resulting in right medial tibial plateau impacted fracture and left shoulder nondisplaced left humeral neck fracture. Upon ED arrival the patient's ECG revealed atrial fibrillation with RVR, thus cardiology was consulted. Additionally, the patient was found to be hypotensive and with an elevated WBC.   The HPI was obtained from both the patient and her husband who was at the bedside due to the patient's underlying Alzheimer's disease.   The patient reports to be feeling well at the moment and she denies having any chest pain, dyspnea, palpitations or dizziness at this time. The patient's husband reports that she has never had any cardiac issues and that this is all " new" for her in regards to the atrial fibrillation. He reports that she has never been seen by a cardiologist. The husband does state that the patient has not been having adequate oral intake of food or liquids recently and that her urine output has significantly decreased. He reports that she has not had any significant dizziness at home that he can tell as she has never complained of any palpitations either. He reports that the patient does not have any family history of cardiovascular disease that he is aware of.  Review of systems complete and found to be negative unless listed above     Past Medical History:  Diagnosis Date  . Allergy   . Anxiety   . Chronic cough   . Chronic headache   . COPD (chronic obstructive pulmonary disease)  ([place])   . Dehydration   . Disorders of bilirubin excretion   . Loss of appetite   . Loss of weight   . Memory loss   . NSVD (normal spontaneous vaginal delivery)    x 1  . Pure hypercholesterolemia   . 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 Weight loss   . Wrist fracture 01/2004   fell on tennis court, muscle problem caused  fall    Past Surgical History:  Procedure Laterality Date  . BREAST LUMPECTOMY     bilateral, age 42  . DILATATION & CURETTAGE/HYSTEROSCOPY WITH MYOSURE N/A 07/18/2018   Procedure: DILATATION & CURETTAGE/HYSTEROSCOPY WITH MYOSURE;  Surgeon: Schermerhorn, Gwen Her, MD;  Location: [facility] ORS;  Service: Gynecology;  Laterality: N/A;  . HYSTEROSCOPY WITH D & C N/A 07/18/2018   Procedure: DILATATION AND CURETTAGE /HYSTEROSCOPY;  Surgeon: Schermerhorn, Gwen Her, MD;  Location: [facility] ORS;  Service: Gynecology;  Laterality: N/A;  . TUBAL LIGATION      Medications Prior to Admission  Medication Sig Dispense Refill Last Dose  . Coenzyme Q10 50 MG CAPS Take 120 mg by mouth daily.   01/06/2021 at Unknown time  . Multiple Vitamin (MULTIVITAMIN) tablet Take 1 tablet by mouth daily.   01/06/2021 at Unknown time  . Calcium Carbonate (CALCIUM 600 PO) Take 1 tablet by mouth daily.     . Cholecalciferol (VITAMIN D3) 2000 UNITS TABS Take by mouth as directed.     . donepezil (ARICEPT) 5 MG  tablet Take by mouth.     . dronabinol (MARINOL) 2.5 MG capsule      . escitalopram (LEXAPRO) 5 MG tablet Take 5 mg by mouth daily.     Marland Kitchen MAGNESIUM PO Take 400 mg by mouth as directed.      . mirtazapine (REMERON) 7.5 MG tablet Take 2 tablets (15 mg total) by mouth at bedtime. (Patient not taking: Reported on 01/06/2021) 60 tablet 11 Not Taking at Unknown time  . Probiotic Product (PROBIOTIC PO) Take by mouth daily.       . vitamin C (ASCORBIC ACID) 500 MG tablet Take 1,000 mg by mouth daily.      . vitamin E 400 UNIT capsule Take 400 Units by mouth daily.        Social  History   Socioeconomic History  . Marital status: Married    Spouse name: Not on file  . Number of children: 1  . Years of education: 2 yrs coll  . Highest education level: Not on file  Occupational History  . Occupation: Housewife  Tobacco Use  . Smoking status: Never Smoker  . Smokeless tobacco: Never Used  Vaping Use  . Vaping Use: Never used  Substance and Sexual Activity  . Alcohol use: No  . Drug use: No  . Sexual activity: Never  Other Topics Concern  . Not on file  Social History Narrative   Desires CPR.   Lives at home with her husband.   Right-handed.   No caffeine use.   Social Determinants of Health   Financial Resource Strain: Not on file  Food Insecurity: Not on file  Transportation Needs: Not on file  Physical Activity: Not on file  Stress: Not on file  Social Connections: Not on file  Intimate Partner Violence: Not on file    Family History  Problem Relation Age of Onset  . Alzheimer's disease Mother   . Alzheimer's disease Father   . Stroke Father        x 5  . Heart disease Father   . Cancer Paternal Aunt        throat  . Cancer Paternal Grandmother        throat  . Diabetes Neg Hx   . Alcohol abuse Neg Hx   . Colon cancer Neg Hx       Review of systems complete and found to be negative unless listed above      PHYSICAL EXAM  General: Well developed, malnourished, in no acute distress HEENT:  Normocephalic and atraumatic. PERRL.  Neck:  No JVD.  Lungs: Clear bilaterally to auscultation. Chest expansion symmetrical. No wheezes rales or no rhonchi noted. Heart: Irregular heart rate and rhythm. Normal S1 and S2 without gallops or murmurs.  Abdomen: Bowel sounds are positive, abdomen soft and non-tender  Msk: Diminished strength and tone for age. Left arm continues to remain in sling. Extremities: No clubbing, cyanosis or edema.   Neuro: Alert and oriented X 2. Psych:  Good affect, responds appropriately  Labs:   Lab Results   Component Value Date   WBC 13.9 (H) 01/07/2021   HGB 6.0 (L) 01/07/2021   HCT 18.8 (L) 01/07/2021   MCV 102.4 (H) 01/07/2021   PLT 103 (L) 01/07/2021    Recent Labs  Lab 01/06/21 2041 01/07/21 0520  NA 144 144  K 3.7 3.8  CL 110 116*  CO2 26 22  BUN 16 16  CREATININE 1.08* 0.75  CALCIUM 8.1* 6.7*  PROT 5.4*  --  BILITOT 1.1  --   ALKPHOS 23*  --   ALT 21  --   AST 23  --   GLUCOSE 151* 146*   No results found for: CKTOTAL, CKMB, CKMBINDEX, TROPONINI  Lab Results  Component Value Date   CHOL 159 01/07/2021   CHOL 231 (H) 01/20/2016   CHOL 245 (H) 11/14/2012   Lab Results  Component Value Date   HDL 66 01/07/2021   HDL 95.40 01/20/2016   HDL 95.50 11/14/2012   Lab Results  Component Value Date   LDLCALC 84 01/07/2021   LDLCALC 117 (H) 01/20/2016   LDLCALC 67 09/10/2011   Lab Results  Component Value Date   TRIG 44 01/07/2021   TRIG 93.0 01/20/2016   TRIG 99.0 11/14/2012   Lab Results  Component Value Date   CHOLHDL 2.4 01/07/2021   CHOLHDL 2 01/20/2016   CHOLHDL 3 11/14/2012   Lab Results  Component Value Date   LDLDIRECT 133.9 11/14/2012      Radiology: DG Pelvis 1-2 Views  Result Date: 01/06/2021 CLINICAL DATA:  Unwitnessed fall at home. EXAM: PELVIS - 1-2 VIEW COMPARISON:  None. FINDINGS: Both hips are normally located. No definite acute hip fracture. The pubic symphysis and SI joints are intact. No definite pelvic fractures. IMPRESSION: No acute bony findings. Electronically Signed   By: Marijo Sanes M.D.   On: 01/06/2021 17:21   CT Head Wo Contrast  Result Date: 01/06/2021 CLINICAL DATA:  Patient found down on floor by has bone, unknown mechanism. Patient endorses neck and back pain. EXAM: CT HEAD WITHOUT CONTRAST CT CERVICAL SPINE WITHOUT CONTRAST TECHNIQUE: Multidetector CT imaging of the head and cervical spine was performed following the standard protocol without intravenous contrast. Multiplanar CT image reconstructions of the cervical  spine were also generated. COMPARISON:  None. FINDINGS: CT HEAD FINDINGS Study is slightly motion degraded. Brain: No evidence of acute infarction, hemorrhage, hydrocephalus, extra-axial collection or mass lesion/mass effect. Global parenchymal volume loss with ex vacuo dilatation of the ventricular system. Mild volume of chronic ischemic Becker Christopher matter disease. 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 Vascular: No hyperdense vessel or unexpected calcification. Skull: Normal. Negative for fracture or focal lesion. Sinuses/Orbits: Paranasal sinuses are clear.  Prior lens surgery. Other: None CT CERVICAL SPINE FINDINGS Study is slightly motion degraded. Alignment: Normal. Skull base and vertebrae: No acute fracture. No primary bone lesion or focal pathologic process. Soft tissues and spinal canal: No prevertebral fluid or swelling. No visible canal hematoma. Disc levels: Multilevel degenerative changes spine with disc space narrowing, disc osteophyte complex ease, uncovertebral and facet hypertrophy, most significant in the lower cervical spine. Upper chest: Biapical scarring. Other: None IMPRESSION: Study is slightly motion degraded.  Within this context: 1.  There is no acute intracranial abnormality 2.  No visualized fracture or subluxation of the cervical spine. Electronically Signed   By: Dahlia Bailiff MD   On: 01/06/2021 16:27   CT Cervical Spine Wo Contrast  Result Date: 01/06/2021 CLINICAL DATA:  Patient found down on floor by has bone, unknown mechanism. Patient endorses neck and back pain. EXAM: CT HEAD WITHOUT CONTRAST CT CERVICAL SPINE WITHOUT CONTRAST TECHNIQUE: Multidetector CT imaging of the head and cervical spine was performed following the standard protocol without intravenous contrast. Multiplanar CT image reconstructions of the cervical spine were also generated. COMPARISON:  None. FINDINGS: CT HEAD FINDINGS Study is slightly motion degraded. Brain: No evidence of acute infarction,  hemorrhage, hydrocephalus, extra-axial collection or mass lesion/mass effect. Global  parenchymal volume loss with ex vacuo dilatation of the ventricular system. Mild volume of chronic ischemic Jennifer Payes matter disease. 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 Vascular: No hyperdense vessel or unexpected calcification. Skull: Normal. Negative for fracture or focal lesion. Sinuses/Orbits: Paranasal sinuses are clear.  Prior lens surgery. Other: None CT CERVICAL SPINE FINDINGS Study is slightly motion degraded. Alignment: Normal. Skull base and vertebrae: No acute fracture. No primary bone lesion or focal pathologic process. Soft tissues and spinal canal: No prevertebral fluid or swelling. No visible canal hematoma. Disc levels: Multilevel degenerative changes spine with disc space narrowing, disc osteophyte complex ease, uncovertebral and facet hypertrophy, most significant in the lower cervical spine. Upper chest: Biapical scarring. Other: None IMPRESSION: Study is slightly motion degraded.  Within this context: 1.  There is no acute intracranial abnormality 2.  No visualized fracture or subluxation of the cervical spine. Electronically Signed   By: Dahlia Bailiff MD   On: 01/06/2021 16:27   CT Knee Right Wo Contrast  Result Date: 01/06/2021 CLINICAL DATA:  [place].  Abnormal x-rays. EXAM: CT OF THE right KNEE WITHOUT CONTRAST TECHNIQUE: Multidetector CT imaging of the right knee was performed according to the standard protocol. Multiplanar CT image reconstructions were also generated. COMPARISON:  Right knee radiographs, same date. FINDINGS: Nondisplaced but slightly impacted medial tibial plateau fracture. Small fracture lines along the intra-articular portion accounting for the lipohemarthrosis. The tibial spines and lateral tibial plateau are intact. No femur, fibula or patella fractures. Advanced changes of osteoporosis. Moderate-sized lipohemarthrosis is noted. Grossly by CT the cruciate and collateral  ligaments are intact. The quadriceps and patellar tendons are intact. IMPRESSION: 1. Nondisplaced but slightly impacted medial tibial plateau fracture. 2. Moderate-sized lipohemarthrosis. 3. Advanced changes of osteoporosis. 4. No other fractures are identified. Electronically Signed   By: Marijo Sanes M.D.   On: 01/06/2021 19:06   DG Shoulder Left  Result Date: 01/06/2021 CLINICAL DATA:  Found down at home.  Left shoulder pain. EXAM: LEFT SHOULDER - 2+ VIEW COMPARISON:  None. FINDINGS: Nondisplaced fracture noted through the humeral neck and probably involving the base of the greater tuberosity. The [facility] joint is intact. No clavicle fracture. The visualized left ribs are intact and the visualized left lung is grossly clear. IMPRESSION: Nondisplaced left humeral neck fracture. Electronically Signed   By: Marijo Sanes M.D.   On: 01/06/2021 17:17   DG Knee Complete 4 Views Right  Result Date: 01/06/2021 CLINICAL DATA:  Found down at home.  Right knee pain. EXAM: RIGHT KNEE - COMPLETE 4+ VIEW COMPARISON:  None FINDINGS: The joint spaces are maintained. No acute fracture is identified. However, there is a lipohemarthrosis noted on the lateral film, highly suggestive of fracture. Recommend CT for further evaluation. IMPRESSION: No definite acute fracture. However, suspect lipohemarthrosis highly suggestive of fracture. CT is recommended for further evaluation. Electronically Signed   By: Marijo Sanes M.D.   On: 01/06/2021 17:20    EKG: Atrial fibrillation with RVR.  ASSESSMENT AND PLAN:  Haley Kim is a 75 year old female with PMH significant for mixed Alzheimer's and vascular dementia, hypercholesterolemia and COPD who was admitted to [facility] due to an unwitnessed fall resulting in right medial tibial plateau impacted fracture and left shoulder nondisplaced left humeral neck fracture; the patient was subsequently to be in atrial fibrillation with RVR. The patient continues to be in atrial fibrillation with  a heart rate in the 120s while on the diltiazem drip. The patient is a poor historian due  to severe Alzheimer's and vascular dementia; however, the patient's husband is at the bedside and reports no history of cardiovascular disease or irregular heart rhythms. The patient appears malnourished and has had diminished urine output over the last 24 hours. The patient's hemoglobin is critically low now at 6.0 with a hematocrit of 18.8 which is concerning. The patient's TSH is normal and all other blood work is unremarkable at this time.  1. Atrial fibrillation with RVR, fairly stable, heart rate is remaining between 115 and 125 bpm at this time  -Echocardiogram results pending.  -Continuous telemetry monitoring.  -Hold Eliquis therapy at this time in the presence of severe anemia.  -Increase Cardizem drip to 10mg /hr for a target HR of around 100 bpm.   -Correct magnesium; recommend magnesium replacement due to level of 1.6.   -Recommend D5 1/5 NS infusion at 127mL/hr continuously (to be given after blood transfusion) in the presence of decreased oral intake.   -Monitor strict I&O's.   2. Anemia, critical, recent H&H = 6.0/18.8 (L)  -Recommend holding Eliquis therapy at this time in the presence of anemia.  -Recommend considering blood transfusion if this does not go against the patient/family wishes.  3. Hypercholesteremia, reasonably controlled, lipid profile reveals a total cholesterol = 159, HDL = 66, LDL = 84  -Recommend heart healthy diet.  -Statin therapy deferred at this time in the presence of severe dementia.  4. SIRS, fairly stable  -Agree with current management.   -Urine and blood cultures are pending.  5. Mechanical fall with subsequent right medial tibial plateau fracture and left humeral neck fracture  -Agree with management per orthopedics.  6. Mixed Alzheimer's and vascular dementia, chronic with worsening  -Agree with current management.  7. Malnutrition  -Recommend swallow  evaluation once patient is stabilized and nutrition consult   Signed: Deionte Spivack [place] 01/07/2021, 12:18 PM

## 2021-01-07 NOTE — Consult Note (Signed)
Pharmacy Antibiotic Note  Haley Kim is a 75 y.o. female admitted on 01/06/2021 with sepsis.  Pharmacy has been consulted for Vancomycin & Cefepime dosing.  Plan: Vancomycin 750mg  x1; f/b 500mg  q48h, improvement in Scr will change to 500 mg q36H. Predicted AUC 476. Goal AUC 400-550.  Scr: 0.75; wt: 31.8kg; ht: historic 66.25"   Cefepime 2g x1; f/b 2g q24h  Metronidazole 500mg  IV q8h  Height: 5' 6.25" (168.3 cm) Weight: 31.8 kg (70 lb) IBW/kg (Calculated) : 59.88  Temp (24hrs), Avg:98.1 F (36.7 C), Min:98 F (36.7 C), Max:98.1 F (36.7 C)  Recent Labs  Lab 01/06/21 1548 01/06/21 2041 01/07/21 0520  WBC 14.9* 17.2* 13.9*  CREATININE 1.17* 1.08* 0.75  LATICACIDVEN  --  2.3* 1.8    Estimated Creatinine Clearance: 29.6 mL/min (by C-G formula based on SCr of 0.75 mg/dL).    Allergies  Allergen Reactions  . Donepezil     hallucinations  . Dronabinol   . Mirtazapine     hallucinations    Antimicrobials this admission: VAN (2/07 >>  CFP (2/07 >>  MTZ (2/07 >>   Microbiology results: 2/07 BCx: pending 2/07 Sputum: pending  2/07 MRSA PCR: pending  Thank you for allowing pharmacy to be a part of this patient's care.  Oswald Hillock 01/07/2021 12:18 PM

## 2021-01-07 NOTE — ED Notes (Signed)
MD aware of BP

## 2021-01-07 NOTE — ED Notes (Signed)
Informed receiving nurse that pt will be brought up in 5-10 min and informed about new L wrist swelling/deformity/redness.

## 2021-01-07 NOTE — Consult Note (Signed)
ORTHOPAEDIC CONSULTATION  REQUESTING PHYSICIAN: Jennye Boroughs, MD  Chief Complaint:   Left shoulder, left wrist, and right knee pain.  History of Present Illness: Haley Kim is a 75 y.o. female with multiple medical problems including COPD, osteoporosis, memory issues, and hypercholesterolemia who lives independently with her husband.  She apparently fell down several small steps in her home in an unwitnessed fall, resulting in these injuries.  She was brought to the emergency room where x-rays demonstrated a minimally impacted 2 part surgical neck fracture of the left proximal humerus and an essentially nondisplaced medial tibial plateau fracture.  The patient denies striking her head or losing consciousness, but is a poor historian and is unsure whether she had any lightheadedness, dizziness, chest pain, shortness of breath, or other symptoms precipitating her fall.  She was noted to have an elevated heart rate in the emergency room, possibly secondary to new onset atrial fibrillation which may have contributed to her fall.  Upon questioning the patient this morning, she notes moderate pain in her left shoulder and right knee, as well as pain and swelling in her left wrist which apparently had not been noticed during her time in the emergency room.  She denies any numbness or paresthesias to her left upper extremity and hand, nor to her right lower extremity and foot.  Past Medical History:  Diagnosis Date  . Allergy   . Anxiety   . Chronic cough   . Chronic headache   . COPD (chronic obstructive pulmonary disease) ([place])   . Dehydration   . Disorders of bilirubin excretion   . Loss of appetite   . Loss of weight   . Memory loss   . NSVD (normal spontaneous vaginal delivery)    x 1  . Pure hypercholesterolemia   . 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 Weight loss   . Wrist fracture 01/2004    fell on tennis court, muscle problem caused  fall   Past Surgical History:  Procedure Laterality Date  . BREAST LUMPECTOMY     bilateral, age 28  . DILATATION & CURETTAGE/HYSTEROSCOPY WITH MYOSURE N/A 07/18/2018   Procedure: DILATATION & CURETTAGE/HYSTEROSCOPY WITH MYOSURE;  Surgeon: Schermerhorn, Gwen Her, MD;  Location: [facility] ORS;  Service: Gynecology;  Laterality: N/A;  . HYSTEROSCOPY WITH D & C N/A 07/18/2018   Procedure: DILATATION AND CURETTAGE /HYSTEROSCOPY;  Surgeon: Schermerhorn, Gwen Her, MD;  Location: [facility] ORS;  Service: Gynecology;  Laterality: N/A;  . TUBAL LIGATION     Social History   Socioeconomic History  . Marital status: Married    Spouse name: Not on file  . Number of children: 1  . Years of education: 2 yrs coll  . Highest education level: Not on file  Occupational History  . Occupation: Housewife  Tobacco Use  . Smoking status: Never Smoker  . Smokeless tobacco: Never Used  Vaping Use  . Vaping Use: Never used  Substance and Sexual Activity  . Alcohol use: No  . Drug use: No  . Sexual activity: Never  Other Topics Concern  . Not on file  Social History Narrative   Desires CPR.   Lives at home with her husband.   Right-handed.   No caffeine use.   Social Determinants of Health   Financial Resource Strain: Not on file  Food Insecurity: Not on file  Transportation Needs: Not on file  Physical Activity: Not on file  Stress: Not on file  Social Connections: Not on file  Family History  Problem Relation Age of Onset  . Alzheimer's disease Mother   . Alzheimer's disease Father   . Stroke Father        x 5  . Heart disease Father   . Cancer Paternal Aunt        throat  . Cancer Paternal Grandmother        throat  . Diabetes Neg Hx   . Alcohol abuse Neg Hx   . Colon cancer Neg Hx    Allergies  Allergen Reactions  . Donepezil     hallucinations  . Dronabinol   . Mirtazapine     hallucinations   Prior to Admission medications    Medication Sig Start Date End Date Taking? Authorizing Provider  Coenzyme Q10 50 MG CAPS Take 120 mg by mouth daily.   Yes [provider]  Multiple Vitamin (MULTIVITAMIN) tablet Take 1 tablet by mouth daily.   Yes [provider]  Calcium Carbonate (CALCIUM 600 PO) Take 1 tablet by mouth daily.    [provider]  Cholecalciferol (VITAMIN D3) 2000 UNITS TABS Take by mouth as directed.    [provider]  donepezil (ARICEPT) 5 MG tablet Take by mouth. 02/18/16 02/17/17  [provider]  dronabinol (MARINOL) 2.5 MG capsule  03/03/16   [provider]  escitalopram (LEXAPRO) 5 MG tablet Take 5 mg by mouth daily.    [provider]  MAGNESIUM PO Take 400 mg by mouth as directed.     [provider]  mirtazapine (REMERON) 7.5 MG tablet Take 2 tablets (15 mg total) by mouth at bedtime. Patient not taking: Reported on 01/06/2021 05/14/16   Marcial Pacas, MD  Probiotic Product (PROBIOTIC PO) Take by mouth daily.      [provider]  vitamin C (ASCORBIC ACID) 500 MG tablet Take 1,000 mg by mouth daily.     [provider]  vitamin E 400 UNIT capsule Take 400 Units by mouth daily.      [provider]   DG Pelvis 1-2 Views  Result Date: 01/06/2021 CLINICAL DATA:  Unwitnessed fall at home. EXAM: PELVIS - 1-2 VIEW COMPARISON:  None. FINDINGS: Both hips are normally located. No definite acute hip fracture. The pubic symphysis and SI joints are intact. No definite pelvic fractures. IMPRESSION: No acute bony findings. Electronically Signed   By: Marijo Sanes M.D.   On: 01/06/2021 17:21   CT Head Wo Contrast  Result Date: 01/06/2021 CLINICAL DATA:  Patient found down on floor by has bone, unknown mechanism. Patient endorses neck and back pain. EXAM: CT HEAD WITHOUT CONTRAST CT CERVICAL SPINE WITHOUT CONTRAST TECHNIQUE: Multidetector CT imaging of the head and cervical spine was performed following the standard protocol  without intravenous contrast. Multiplanar CT image reconstructions of the cervical spine were also generated. COMPARISON:  None. FINDINGS: CT HEAD FINDINGS Study is slightly motion degraded. Brain: No evidence of acute infarction, hemorrhage, hydrocephalus, extra-axial collection or mass lesion/mass effect. Global parenchymal volume loss with ex vacuo dilatation of the ventricular system. Mild volume of chronic ischemic white matter disease. 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 Vascular: No hyperdense vessel or unexpected calcification. Skull: Normal. Negative for fracture or focal lesion. Sinuses/Orbits: Paranasal sinuses are clear.  Prior lens surgery. Other: None CT CERVICAL SPINE FINDINGS Study is slightly motion degraded. Alignment: Normal. Skull base and vertebrae: No acute fracture. No primary bone lesion or focal pathologic process. Soft tissues and spinal canal: No prevertebral fluid  or swelling. No visible canal hematoma. Disc levels: Multilevel degenerative changes spine with disc space narrowing, disc osteophyte complex ease, uncovertebral and facet hypertrophy, most significant in the lower cervical spine. Upper chest: Biapical scarring. Other: None IMPRESSION: Study is slightly motion degraded.  Within this context: 1.  There is no acute intracranial abnormality 2.  No visualized fracture or subluxation of the cervical spine. Electronically Signed   By: Dahlia Bailiff MD   On: 01/06/2021 16:27   CT Cervical Spine Wo Contrast  Result Date: 01/06/2021 CLINICAL DATA:  Patient found down on floor by has bone, unknown mechanism. Patient endorses neck and back pain. EXAM: CT HEAD WITHOUT CONTRAST CT CERVICAL SPINE WITHOUT CONTRAST TECHNIQUE: Multidetector CT imaging of the head and cervical spine was performed following the standard protocol without intravenous contrast. Multiplanar CT image reconstructions of the cervical spine were also generated. COMPARISON:  None. FINDINGS: CT HEAD FINDINGS  Study is slightly motion degraded. Brain: No evidence of acute infarction, hemorrhage, hydrocephalus, extra-axial collection or mass lesion/mass effect. Global parenchymal volume loss with ex vacuo dilatation of the ventricular system. Mild volume of chronic ischemic white matter disease. 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 Vascular: No hyperdense vessel or unexpected calcification. Skull: Normal. Negative for fracture or focal lesion. Sinuses/Orbits: Paranasal sinuses are clear.  Prior lens surgery. Other: None CT CERVICAL SPINE FINDINGS Study is slightly motion degraded. Alignment: Normal. Skull base and vertebrae: No acute fracture. No primary bone lesion or focal pathologic process. Soft tissues and spinal canal: No prevertebral fluid or swelling. No visible canal hematoma. Disc levels: Multilevel degenerative changes spine with disc space narrowing, disc osteophyte complex ease, uncovertebral and facet hypertrophy, most significant in the lower cervical spine. Upper chest: Biapical scarring. Other: None IMPRESSION: Study is slightly motion degraded.  Within this context: 1.  There is no acute intracranial abnormality 2.  No visualized fracture or subluxation of the cervical spine. Electronically Signed   By: Dahlia Bailiff MD   On: 01/06/2021 16:27   CT Knee Right Wo Contrast  Result Date: 01/06/2021 CLINICAL DATA:  [place].  Abnormal x-rays. EXAM: CT OF THE right KNEE WITHOUT CONTRAST TECHNIQUE: Multidetector CT imaging of the right knee was performed according to the standard protocol. Multiplanar CT image reconstructions were also generated. COMPARISON:  Right knee radiographs, same date. FINDINGS: Nondisplaced but slightly impacted medial tibial plateau fracture. Small fracture lines along the intra-articular portion accounting for the lipohemarthrosis. The tibial spines and lateral tibial plateau are intact. No femur, fibula or patella fractures. Advanced changes of osteoporosis. Moderate-sized  lipohemarthrosis is noted. Grossly by CT the cruciate and collateral ligaments are intact. The quadriceps and patellar tendons are intact. IMPRESSION: 1. Nondisplaced but slightly impacted medial tibial plateau fracture. 2. Moderate-sized lipohemarthrosis. 3. Advanced changes of osteoporosis. 4. No other fractures are identified. Electronically Signed   By: Marijo Sanes M.D.   On: 01/06/2021 19:06   DG Shoulder Left  Result Date: 01/06/2021 CLINICAL DATA:  Found down at home.  Left shoulder pain. EXAM: LEFT SHOULDER - 2+ VIEW COMPARISON:  None. FINDINGS: Nondisplaced fracture noted through the humeral neck and probably involving the base of the greater tuberosity. The [facility] joint is intact. No clavicle fracture. The visualized left ribs are intact and the visualized left lung is grossly clear. IMPRESSION: Nondisplaced left humeral neck fracture. Electronically Signed   By: Marijo Sanes M.D.   On: 01/06/2021 17:17   DG Knee Complete 4 Views Right  Result Date: 01/06/2021 CLINICAL  DATA:  Found down at home.  Right knee pain. EXAM: RIGHT KNEE - COMPLETE 4+ VIEW COMPARISON:  None FINDINGS: The joint spaces are maintained. No acute fracture is identified. However, there is a lipohemarthrosis noted on the lateral film, highly suggestive of fracture. Recommend CT for further evaluation. IMPRESSION: No definite acute fracture. However, suspect lipohemarthrosis highly suggestive of fracture. CT is recommended for further evaluation. Electronically Signed   By: Marijo Sanes M.D.   On: 01/06/2021 17:20    Positive ROS: All other systems have been reviewed and were otherwise negative with the exception of those mentioned in the HPI and as above.  Physical Exam: General:  Alert, no acute distress Psychiatric:  Patient is of uncertain competence for consent, but exhibits normal mood and affect   Cardiovascular:  No pedal edema Respiratory:  No wheezing, non-labored breathing GI:  Abdomen is soft and  non-tender Skin:  No lesions in the area of chief complaint Neurologic:  Sensation intact distally Lymphatic:  No axillary or cervical lymphadenopathy  Orthopedic Exam:  Orthopedic examination of the left shoulder is notable for mild swelling of the shoulder and upper arm.  Skin inspection otherwise is unremarkable.  No erythema, ecchymosis, abrasions, or other skin abnormalities are identified.  She has mild tenderness to palpation of the anterolateral aspect of the shoulder.  She has more severe pain with any attempted active or passive motion of the shoulder.  Examination of her left wrist demonstrates moderate swelling with early ecchymosis around her wrist.  No erythema, abrasions, or other skin abnormalities are identified.  She has pain with any attempted active or passive motion of the wrist.  She is able to flex and extend her digits, although motion is limited due to pain and apprehension.  She is neurovascularly intact to her left upper extremity and hand.  Orthopedic examination of the right knee and lower extremity is notable for mild-moderate swelling around the knee with a 1-2+ knee effusion.  No erythema, ecchymosis, abrasions, other skin abnormalities are identified.  She has moderate tenderness to palpation over the medial aspect of the knee.  She has more severe pain with any attempted active or passive motion of the knee.  The knee appears to be well aligned clinically.  The knee does appear to be stable to gentle varus and valgus stressing in extension.  She is neurovascularly intact to the right lower extremity and foot.  X-rays:  X-rays of the left shoulder are available for review and have been reviewed by myself.  The findings are as described above.  X-rays of the left wrist demonstrate some abnormality of the distal radius as there is dorsal tilt with complete loss of normal volar tilt, and mild loss of radial inclination.  However, there is no clear fracture line on these  films.  The radiocarpal joint and distal radioulnar joint both appear to be intact and uncompromised.  No lytic lesions or significant degenerative changes are noted.  X-rays and a subsequent CT scan of the right knee are available for review and have been reviewed by myself.  Findings are as described above.  No significant degenerative changes of the right knee are identified.  Assessment: 1.  Minimally displaced 2 part surgical neck fracture of left proximal humerus. 2.  Impacted left distal radius fracture. 3.  Minimally impacted right medial tibial plateau fracture.  Plan: The treatment options for each of these problems have been discussed with the patient and her husband, who is at the bedside.  None of these fractures require surgical intervention as each fracture is minimally displaced and there is no compromise of the adjacent joint.  Unfortunately, with these multiple injuries, the patient is faced with a difficult recovery process, which has been explained to the patient and her husband.   For her left shoulder, she is to wear a shoulder sling on a regular basis, removing only for bathing purposes.  Most likely, she will need to wear the sling for about 4 weeks before starting formal therapy to work on left shoulder range of motion and upper extremity strengthening exercises.  However, she is to avoid any weightbearing through the left upper extremity or shoulder.  Regarding her left wrist injury, she does have a history of a prior wrist injury, although it is unclear as to which wrist was involved.  The x-rays do show evidence of distal radius deformity, but there is no obvious acute fracture line.  However, she does have moderate swelling and early ecchymosis, suggesting an acute injury to the wrist.  Given that the radiocarpal joint is well-maintained and shows no significant degenerative changes, this injury can be treated nonsurgically with a Velcro wrist immobilizer.  This splint is  to be removed only for bathing purposes.  Finally, in regards to her right knee injury, this injury also can be managed nonsurgically.  She will be placed into a knee immobilizer which she is to wear at all times, removing only for bathing purposes.  She is to remain nonweightbearing on the right lower extremity or foot for at least 6 to 8 weeks while the fracture heals.  Thank you for asking me to participate in the care of this most unfortunate woman.  I will be happy to follow her with you.   Pascal Lux, MD  Beeper #:  [phone]  01/07/2021 1:45 PM

## 2021-01-08 LAB — BPAM RBC
Blood Product Expiration Date: 202202242359
Blood Product Expiration Date: 202202252359
ISSUE DATE / TIME: 202202081434
ISSUE DATE / TIME: 202202081749
Unit Type and Rh: 600
Unit Type and Rh: 600

## 2021-01-08 LAB — TYPE AND SCREEN
ABO/RH(D): A NEG
Antibody Screen: NEGATIVE
Unit division: 0
Unit division: 0

## 2021-01-08 LAB — ECHOCARDIOGRAM COMPLETE
Height: 66.25 in
S' Lateral: 2.59 cm
Weight: 1120 oz

## 2021-01-08 LAB — HEMOGLOBIN AND HEMATOCRIT, BLOOD
HCT: 35.9 % — ABNORMAL LOW (ref 36.0–46.0)
Hemoglobin: 12.3 g/dL (ref 12.0–15.0)

## 2021-01-08 NOTE — Evaluation (Signed)
Occupational Therapy Evaluation Patient Details Name: Haley Kim MRN: [id number] DOB: 1946-02-29 Today's Date: 01/08/2021    History of Present Illness 75 y.o. female COPD, dyslipidemia, dementia, who presented to the hospital after a fall at home.  Reportedly, there was no loss of consciousness.  In the ED, she was tachycardic and she was found to have atrial fibrillation with RVR.  Work-up revealed left humeral neck fracture, right medial tibial plateau fracture complicated by moderate-sized lipohemarthrosis, fracture of the distal left radius and minimally displaced left ulnar styloid fracture.   Clinical Impression   Patient presenting with decreased I in self care, balance, functional mobility/transfer, endurance, and safety awareness. Pt's husband present and reports pt performs ADLs independently at home without use of AD. However, she has a "weak back" and usually tries to do other tasks but is unable to perform any other activities during the day. He also reports she has a " 3 second memory". Pt is unaware of being in the hospital, injuries, or even husbands name during evaluation. Speech very tangential and unable to answer any questions accurately. Pt verbalized need for toileting and is rolled with total A onto bed pan and therapist changed chux pad. Pt performed hygiene with set up A and mod cuing. OT repositioned KI for safety. MAX A to EOB with pt sitting for 5 minutes with min guard for balance. Pt needing total A for sit >supine. Pt's husband reports he has health concerns and is concerned about people in the home with COVID at this time. Home is also a barrier with 4 STE with no rail and bedrooms being upstairs.  Patient will benefit from acute OT to increase overall independence in the areas of ADLs, functional mobility, and safety awareness in order to safely discharge to next venue of care.    Follow Up Recommendations  SNF    Equipment Recommendations  Other (comment)  (defer to next venue of care)       Precautions / Restrictions Precautions Precautions: Fall Required Braces or Orthoses: Knee Immobilizer - Right;Sling;Other Brace Knee Immobilizer - Right: On at all times Other Brace: wrist brace (not present in room) and sling on L UE NWB Restrictions Weight Bearing Restrictions: Yes LUE Weight Bearing: Non weight bearing RLE Weight Bearing: Non weight bearing      Mobility Bed Mobility Overal bed mobility: Needs Assistance Bed Mobility: Supine to Sit;Sit to Supine     Supine to sit: Max assist Sit to supine: Total assist   General bed mobility comments: pt able to move L LE towards EOB for supine >sit.    Transfers                 General transfer comment: deferred secondary to safety and pt unable to follow commands for weight bearing    Balance Overall balance assessment: Needs assistance Sitting-balance support: Feet supported Sitting balance-Leahy Scale: Fair Sitting balance - Comments: min guard for sitting balance                                   ADL either performed or assessed with clinical judgement   ADL Overall ADL's : Needs assistance/impaired Eating/Feeding: Total assistance   Grooming: Wash/dry hands;Wash/dry face;Bed level;Moderate assistance                                 General  ADL Comments: total A to roll onto bed pan for urination and then pt was able to perform hygiene with mod multimodal cuing     Vision Patient Visual Report: No change from baseline              Pertinent Vitals/Pain Pain Assessment: Faces Faces Pain Scale: Hurts even more Pain Location: R LE Pain Descriptors / Indicators: Discomfort;Grimacing Pain Intervention(s): Limited activity within patient's tolerance;Monitored during session;Repositioned     Hand Dominance Right   Extremity/Trunk Assessment Upper Extremity Assessment Upper Extremity Assessment: LUE deficits/detail LUE Deficits  / Details: NWB LUE: Unable to fully assess due to immobilization   Lower Extremity Assessment Lower Extremity Assessment: RLE deficits/detail RLE Deficits / Details: NWB       Communication Communication Communication: No difficulties   Cognition Arousal/Alertness: Awake/alert Behavior During Therapy: Restless Overall Cognitive Status: History of cognitive impairments - at baseline                                 General Comments: husband reports cognition is worse but close to baseline. Pt is verbose but tangential in speech. Unable to answer any questions on same topic as what was asked. Cuing for redirection.              Home Living Family/patient expects to be discharged to:: Private residence Living Arrangements: Spouse/significant other Available Help at Discharge: Family;Available PRN/intermittently Type of Home: House Home Access: Stairs to enter [employer] of Steps: 4 Entrance Stairs-Rails: None Home Layout: Two level;Bed/bath upstairs Alternate Level Stairs-Number of Steps: 14   Bathroom Shower/Tub: Tub/shower unit         Home Equipment: Hospital bed;Walker - 2 wheels          Prior Functioning/Environment Level of Independence: Independent        Comments: Husband reports pt was able to dress and wash self without assistance and ambulates without use of AD. However, once pt does these tasks she is so fatigued she is unable to complete any other tasks within the home. He reports she has a "weak back" that does not allow her to do much else but she tries. He also reports she has a "3 second memory"        OT Problem List: Decreased strength;Decreased activity tolerance;Impaired balance (sitting and/or standing);Decreased range of motion;Decreased safety awareness;Decreased cognition;Pain;Decreased knowledge of precautions;Decreased knowledge of use of DME or AE      OT Treatment/Interventions: Self-care/ADL  training;Therapeutic exercise;Energy conservation;DME and/or AE instruction;Cognitive remediation/compensation;Therapeutic activities;Balance training;Patient/family education;Manual therapy;Modalities    OT Goals(Current goals can be found in the care plan section) Acute Rehab OT Goals Patient Stated Goal: to be able to take care of her OT Goal Formulation: With family Time For Goal Achievement: 01/22/21 Potential to Achieve Goals: Good ADL Goals Pt Will Perform Grooming: with set-up;sitting Pt Will Transfer to Toilet: with mod assist Pt Will Perform Toileting - Clothing Manipulation and hygiene: with mod assist  OT Frequency: Min 1X/week   Barriers to D/C: Other (comment)  none known at this time          AM-PAC OT "6 Clicks" Daily Activity     Outcome Measure Help from another person eating meals?: A Lot Help from another person taking care of personal grooming?: A Lot Help from another person toileting, which includes using toliet, bedpan, or urinal?: Total Help from another person bathing (including washing, rinsing, drying)?: A Lot  Help from another person to put on and taking off regular upper body clothing?: A Lot Help from another person to put on and taking off regular lower body clothing?: Total 6 Click Score: 10   End of Session Nurse Communication: Mobility status  Activity Tolerance: Patient limited by pain;Other (comment) (limited by cognition) Patient left: in bed;with call bell/phone within reach;with bed alarm set;with family/visitor present  OT Visit Diagnosis: Unsteadiness on feet (R26.81);Repeated falls (R29.6);Muscle weakness (generalized) (M62.81)                Time: 3014-8403 OT Time Calculation (min): 52 min Charges:  OT General Charges $OT Visit: 1 Visit OT Evaluation $OT Eval Moderate Complexity: 1 Mod OT Treatments $Self Care/Home Management : 38-52 mins  Darleen Crocker, MS, OTR/L , CBIS ascom [phone]  01/08/21, 4:14 PM

## 2021-01-08 NOTE — Progress Notes (Signed)
PT Cancellation Note  Patient Details Name: Haley Kim MRN: [id number] DOB: [birth year]   Cancelled Treatment:    Reason Eval/Treat Not Completed: Medical issues which prohibited therapy (most recent BP 70s/40s. Will hold PT evaluation at this time.)   [name] C 01/08/2021, 10:19 AM

## 2021-01-08 NOTE — Progress Notes (Signed)
[facility] Cardiology  Patient Description:Haley Kim is a 75 year old female with PMH significant for mixed Alzheimer's and vascular dementia, hypercholesterolemia and COPD who was admitted to [facility] due to an unwitnessed fall resulting in right medial tibial plateau impacted fracture and left shoulder nondisplaced left humeral neck fracture. Upon ED arrival the patient's ECG revealed atrial fibrillation with RVR, thus cardiology was consulted.   SUBJECTIVE: The patient reports to be doing well on today and she denies having any chest pain or dyspnea at this time.  OBJECTIVE: The patient is remarkably confused on today and is only alert and oriented x1.  She was able to tell me that she was not having any pain or shortness of breath; however, she was unable to answer any further questions beyond that due to her confusion.  The patient is now on room air and satting at 97%.  Overnight the patient converted back to normal sinus rhythm and continues to be in normal sinus rhythm with a heart rate of 85 bpm.  Additionally, the patient removed her IVs early this morning, that she has not been on the Cardizem drip at all and has sustained a normal sinus rhythm.  The patient continues to be slightly hypotensive; however, she appears to be hemodynamically stable and all of her other vital signs are stable.  Vitals:   01/07/21 2145 01/08/21 0333 01/08/21 0812 01/08/21 1200  BP: 116/60 (!) 108/50 95/69 (!) 96/57  Pulse: 88 (!) 108 85 74  Resp:  19 20 18   Temp: 98.2 F (36.8 C) 98.2 F (36.8 C) 98.4 F (36.9 C)   TempSrc: Oral Oral    SpO2: 97% 90% 95%   Weight:  35.4 kg    Height:         Intake/Output Summary (Last 24 hours) at 01/08/2021 1322 Last data filed at 01/08/2021 0600 Gross per 24 hour  Intake 1236.56 ml  Output 450 ml  Net 786.56 ml      PHYSICAL EXAM  General: Well developed, malnourished, in no acute distress HEENT:  Normocephalic and atraumatic. PERRL.  Neck:   No JVD.  Lungs: Clear  bilaterally to auscultation. Chest expansion symmetrical. No wheezes rales or no rhonchi noted. Heart: Irregular heart rate and rhythm. Normal S1 and S2 without gallops or murmurs.  Abdomen: Bowel sounds are positive, abdomen soft and non-tender  Msk: Diminished strength and tone for age. Left arm continues to remain in sling. Extremities: No clubbing, cyanosis or edema.   Neuro: Alert and oriented X 1 Psych:  Good affect, responds inappropriately   LABS: Basic Metabolic Panel: Recent Labs    01/06/21 2041 01/07/21 0520  NA 144 144  K 3.7 3.8  CL 110 116*  CO2 26 22  GLUCOSE 151* 146*  BUN 16 16  CREATININE 1.08* 0.75  CALCIUM 8.1* 6.7*  MG  --  1.6*   Liver Function Tests: Recent Labs    01/06/21 2041  AST 23  ALT 21  ALKPHOS 23*  BILITOT 1.1  PROT 5.4*  ALBUMIN 3.5   No results for input(s): LIPASE, AMYLASE in the last 72 hours. CBC: Recent Labs    01/06/21 2041 01/07/21 0520 01/07/21 1019 01/07/21 2313  WBC 17.2* 13.9*  --   --   NEUTROABS 15.6*  --   --   --   HGB 9.1* 7.1* 6.0* 12.3  HCT 27.5* 21.6* 18.8* 35.9*  MCV 100.7* 102.4*  --   --   PLT 139* 103*  --   --  Cardiac Enzymes: No results for input(s): CKTOTAL, CKMB, CKMBINDEX, TROPONINI in the last 72 hours. BNP: Invalid input(s): POCBNP D-Dimer: No results for input(s): DDIMER in the last 72 hours. Hemoglobin A1C: No results for input(s): HGBA1C in the last 72 hours. Fasting Lipid Panel: Recent Labs    01/07/21 0520  CHOL 159  HDL 66  LDLCALC 84  TRIG 44  CHOLHDL 2.4   Thyroid Function Tests: Recent Labs    01/07/21 0520  TSH 1.344   Anemia Panel: Recent Labs    01/07/21 0520  VITAMINB12 606  FERRITIN 141  TIBC 182*  IRON 15*    DG Pelvis 1-2 Views  Result Date: 01/06/2021 CLINICAL DATA:  Unwitnessed fall at home. EXAM: PELVIS - 1-2 VIEW COMPARISON:  None. FINDINGS: Both hips are normally located. No definite acute hip fracture. The pubic symphysis and SI joints are  intact. No definite pelvic fractures. IMPRESSION: No acute bony findings. Electronically Signed   By: Marijo Sanes M.D.   On: 01/06/2021 17:21   DG Wrist 2 Views Left  Result Date: 01/07/2021 CLINICAL DATA:  Left wrist pain after fall yesterday EXAM: LEFT WRIST - 2 VIEW COMPARISON:  None. FINDINGS: Frontal and lateral views of the left wrist are obtained. There is an impacted fracture of the distal left radius with dorsal angulation. There appears to be intra-articular extension on the frontal view. Small displaced ulnar styloid fracture also noted. The radiocarpal joint remains intact. Incidental lunotriquetral coalition, and anatomic variant. There is diffuse soft tissue edema of the left wrist and distal forearm. IMPRESSION: 1. Impacted intra-articular fracture of the distal left radius, with dorsal angulation at the radiocarpal joint. 2. Minimally displaced ulnar styloid fracture. 3. Diffuse soft tissue edema. Electronically Signed   By: Randa Ngo M.D.   On: 01/07/2021 15:02   CT Head Wo Contrast  Result Date: 01/06/2021 CLINICAL DATA:  Patient found down on floor by has bone, unknown mechanism. Patient endorses neck and back pain. EXAM: CT HEAD WITHOUT CONTRAST CT CERVICAL SPINE WITHOUT CONTRAST TECHNIQUE: Multidetector CT imaging of the head and cervical spine was performed following the standard protocol without intravenous contrast. Multiplanar CT image reconstructions of the cervical spine were also generated. COMPARISON:  None. FINDINGS: CT HEAD FINDINGS Study is slightly motion degraded. Brain: No evidence of acute infarction, hemorrhage, hydrocephalus, extra-axial collection or mass lesion/mass effect. Global parenchymal volume loss with ex vacuo dilatation of the ventricular system. Mild volume of chronic ischemic Glynda Soliday matter disease. 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 Vascular: No hyperdense vessel or unexpected calcification. Skull: Normal. Negative for fracture or focal  lesion. Sinuses/Orbits: Paranasal sinuses are clear.  Prior lens surgery. Other: None CT CERVICAL SPINE FINDINGS Study is slightly motion degraded. Alignment: Normal. Skull base and vertebrae: No acute fracture. No primary bone lesion or focal pathologic process. Soft tissues and spinal canal: No prevertebral fluid or swelling. No visible canal hematoma. Disc levels: Multilevel degenerative changes spine with disc space narrowing, disc osteophyte complex ease, uncovertebral and facet hypertrophy, most significant in the lower cervical spine. Upper chest: Biapical scarring. Other: None IMPRESSION: Study is slightly motion degraded.  Within this context: 1.  There is no acute intracranial abnormality 2.  No visualized fracture or subluxation of the cervical spine. Electronically Signed   By: Dahlia Bailiff MD   On: 01/06/2021 16:27   CT Cervical Spine Wo Contrast  Result Date: 01/06/2021 CLINICAL DATA:  Patient found down on floor by has bone, unknown mechanism. Patient endorses  neck and back pain. EXAM: CT HEAD WITHOUT CONTRAST CT CERVICAL SPINE WITHOUT CONTRAST TECHNIQUE: Multidetector CT imaging of the head and cervical spine was performed following the standard protocol without intravenous contrast. Multiplanar CT image reconstructions of the cervical spine were also generated. COMPARISON:  None. FINDINGS: CT HEAD FINDINGS Study is slightly motion degraded. Brain: No evidence of acute infarction, hemorrhage, hydrocephalus, extra-axial collection or mass lesion/mass effect. Global parenchymal volume loss with ex vacuo dilatation of the ventricular system. Mild volume of chronic ischemic Dominiq Fontaine matter disease. 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 Vascular: No hyperdense vessel or unexpected calcification. Skull: Normal. Negative for fracture or focal lesion. Sinuses/Orbits: Paranasal sinuses are clear.  Prior lens surgery. Other: None CT CERVICAL SPINE FINDINGS Study is slightly motion degraded.  Alignment: Normal. Skull base and vertebrae: No acute fracture. No primary bone lesion or focal pathologic process. Soft tissues and spinal canal: No prevertebral fluid or swelling. No visible canal hematoma. Disc levels: Multilevel degenerative changes spine with disc space narrowing, disc osteophyte complex ease, uncovertebral and facet hypertrophy, most significant in the lower cervical spine. Upper chest: Biapical scarring. Other: None IMPRESSION: Study is slightly motion degraded.  Within this context: 1.  There is no acute intracranial abnormality 2.  No visualized fracture or subluxation of the cervical spine. Electronically Signed   By: Dahlia Bailiff MD   On: 01/06/2021 16:27   CT Knee Right Wo Contrast  Result Date: 01/06/2021 CLINICAL DATA:  [place].  Abnormal x-rays. EXAM: CT OF THE right KNEE WITHOUT CONTRAST TECHNIQUE: Multidetector CT imaging of the right knee was performed according to the standard protocol. Multiplanar CT image reconstructions were also generated. COMPARISON:  Right knee radiographs, same date. FINDINGS: Nondisplaced but slightly impacted medial tibial plateau fracture. Small fracture lines along the intra-articular portion accounting for the lipohemarthrosis. The tibial spines and lateral tibial plateau are intact. No femur, fibula or patella fractures. Advanced changes of osteoporosis. Moderate-sized lipohemarthrosis is noted. Grossly by CT the cruciate and collateral ligaments are intact. The quadriceps and patellar tendons are intact. IMPRESSION: 1. Nondisplaced but slightly impacted medial tibial plateau fracture. 2. Moderate-sized lipohemarthrosis. 3. Advanced changes of osteoporosis. 4. No other fractures are identified. Electronically Signed   By: Marijo Sanes M.D.   On: 01/06/2021 19:06   DG Shoulder Left  Result Date: 01/06/2021 CLINICAL DATA:  Found down at home.  Left shoulder pain. EXAM: LEFT SHOULDER - 2+ VIEW COMPARISON:  None. FINDINGS: Nondisplaced fracture  noted through the humeral neck and probably involving the base of the greater tuberosity. The [facility] joint is intact. No clavicle fracture. The visualized left ribs are intact and the visualized left lung is grossly clear. IMPRESSION: Nondisplaced left humeral neck fracture. Electronically Signed   By: Marijo Sanes M.D.   On: 01/06/2021 17:17   DG Knee Complete 4 Views Right  Result Date: 01/06/2021 CLINICAL DATA:  Found down at home.  Right knee pain. EXAM: RIGHT KNEE - COMPLETE 4+ VIEW COMPARISON:  None FINDINGS: The joint spaces are maintained. No acute fracture is identified. However, there is a lipohemarthrosis noted on the lateral film, highly suggestive of fracture. Recommend CT for further evaluation. IMPRESSION: No definite acute fracture. However, suspect lipohemarthrosis highly suggestive of fracture. CT is recommended for further evaluation. Electronically Signed   By: Marijo Sanes M.D.   On: 01/06/2021 17:20   ECHOCARDIOGRAM COMPLETE  Result Date: 01/08/2021    ECHOCARDIOGRAM REPORT   Patient Name:   Haley Kim Date of  Exam: 01/07/2021 Medical Rec #:  [id number]          Height:       66.3 in Accession #:    [id number]         Weight:       70.0 lb Date of Birth:  09/17/1946          BSA:          1.284 m Patient Age:    78 years           BP:           121/64 mmHg Patient Gender: F                  HR:           105 bpm. Exam Location:  [facility] Procedure: 2D Echo, Cardiac Doppler and Color Doppler Indications:    Atrial Fibrillation I48.91  History:        Patient has no prior history of Echocardiogram examinations.                 COPD.  Sonographer:    Sherrie Sport RDCS (AE) Referring Phys: [id number] Mary Sella A MANSY Diagnosing      Yolonda Kida MD Phys:  Sonographer Comments: Technically difficult study due to poor echo windows, no apical window and suboptimal parasternal window. Image acquisition challenging due to patient body habitus and Image acquisition challenging due to COPD. IMPRESSIONS   1. Left ventricular ejection fraction, by estimation, is 50 to 55%. The left ventricle has low normal function. The left ventricle has no regional wall motion abnormalities. Left ventricular diastolic parameters were normal.  2. Right ventricular systolic function is normal. The right ventricular size is normal.  3. The mitral valve is normal in structure. No evidence of mitral valve regurgitation.  4. The aortic valve is normal in structure. Aortic valve regurgitation is not visualized. Conclusion(s)/Recommendation(s): Poor windows for evaluation of left ventricular function by transthoracic echocardiography. Would recommend an alternative means of evaluation. FINDINGS  Left Ventricle: Left ventricular ejection fraction, by estimation, is 50 to 55%. The left ventricle has low normal function. The left ventricle has no regional wall motion abnormalities. The left ventricular internal cavity size was normal in size. There is no left ventricular hypertrophy. Left ventricular diastolic parameters were normal. Right Ventricle: The right ventricular size is normal. No increase in right ventricular wall thickness. Right ventricular systolic function is normal. Left Atrium: Left atrial size was normal in size. Right Atrium: Right atrial size was normal in size. Pericardium: There is no evidence of pericardial effusion. Mitral Valve: The mitral valve is normal in structure. No evidence of mitral valve regurgitation. Tricuspid Valve: The tricuspid valve is not well visualized. Tricuspid valve regurgitation is trivial. Aortic Valve: The aortic valve is normal in structure. Aortic valve regurgitation is not visualized. Pulmonic Valve: The pulmonic valve was normal in structure. Pulmonic valve regurgitation is not visualized. Aorta: The aortic arch was not well visualized. IAS/Shunts: No atrial level shunt detected by color flow Doppler.  LEFT VENTRICLE PLAX 2D LVIDd:         3.23 cm LVIDs:         2.59 cm LV PW:         0.83  cm LV IVS:        0.61 cm LVOT diam:     2.00 cm LVOT Area:     3.14 cm  LEFT ATRIUM  Index LA diam:    2.40 cm 1.87 cm/m                        PULMONIC VALVE AORTA                 PV Vmax:        0.77 m/s Ao Root diam: 2.95 cm PV Peak grad:   2.4 mmHg                       RVOT Peak grad: 2 mmHg   SHUNTS Systemic Diam: 2.00 cm Yolonda Kida MD Electronically signed by Yolonda Kida MD Signature Date/Time: 01/08/2021/8:09:16 AM    Final      Echo: Reveals normal LV systolic function with estimated EF of 50-55%.  TELEMETRY: Normal sinus rhythm with a heart rate of 85 bpm.  ASSESSMENT AND PLAN:  Principal Problem:   Fall at home Active Problems:   Atrial fibrillation with RVR (HCC)   Left humeral fracture   Tibial plateau fracture, right   Acute blood loss anemia    1. Atrial fibrillation with RVR, stable, patient appears to have converted back to normal sinus rhythm with a heart rate at 85 bpm at this time             -Echocardiogram results reveal a low normal LV systolic function with estimated EF of 50-55%.             -Continuous telemetry monitoring.             -Hold Eliquis therapy at this time in the presence of severe anemia.             -We will discontinue Cardizem drip at this time as the patient has successfully converted back to normal sinus rhythm with a controlled heart rate.  -We will consider oral Cardizem if needed for atrial fibrillation management.             -Recommend D5 1/5 NS infusion at 175mL/hr continuously (to be given after blood transfusion) in the presence of decreased oral intake.              -Monitor strict I&O's.   2. Anemia, reasonably stable, H&H is stable today at 12.3/35.9             -Recommend holding Eliquis therapy at this time in the presence of anemia.             -Recommend trending H&H.  3. Hypercholesteremia, reasonably controlled, lipid profile reveals a total cholesterol = 159, HDL = 66, LDL = 84              -Recommend heart healthy diet.             -Statin therapy deferred at this time in the presence of severe dementia.  4. SIRS, fairly stable             -Agree with current management.              -Urine and blood cultures are pending.  5. Mechanical fall with subsequent right medial tibial plateau fracture and left humeral neck fracture             -Agree with management per orthopedics.  6. Mixed Alzheimer's and vascular dementia, chronic with worsening             -Agree with current management.  -We will obtain CBG  stat for further evaluation of possible hypoglycemia that could be contributing to worsening confusion.  7. Malnutrition             -Recommend swallow evaluation once patient is stabilized and nutrition consult   Rainer Mounce, ACNPC-AG  01/08/2021 1:22 PM

## 2021-01-08 NOTE — Progress Notes (Addendum)
Progress Note    Haley Kim  TIW:[id number] DOB: 1946-10-09  DOA: 01/06/2021 PCP: Adin Hector, MD      Brief Narrative:    Medical records reviewed and are as summarized below:  Haley Kim is a 75 y.o. female COPD, dyslipidemia, dementia, who presented to the hospital after a fall at home.  Reportedly, there was no loss of consciousness.  In the ED, she was tachycardic and she was found to have atrial fibrillation with RVR.  Work-up revealed left humeral neck fracture, right medial tibial plateau fracture complicated by moderate-sized lipohemarthrosis, fracture of the distal left radius and minimally displaced left ulnar styloid fracture.  She was treated with IV Cardizem infusion for rapid A. fib.  She developed acute blood loss anemia requiring 2 units of packed red blood cells.  She was also treated with IV fluids and analgesics.  She was evaluated by the orthopedic surgeon who recommended conservative management.      Assessment/Plan:   Principal Problem:   Fall at home Active Problems:   Atrial fibrillation with RVR (HCC)   Left humeral fracture   Tibial plateau fracture, right   Acute blood loss anemia   Body mass index is 12.49 kg/m.  (Underweight): This complicates overall prognosis.  Consult dietitian.    S/p fall, left humeral neck fracture, right medial tibial plateau fracture, moderate sized lipohemarthrosis, distal left radius fracture, minimally displaced left ulnar styloid fracture: Conservative management recommended.  Continue analgesics as needed for pain.  Follow-up in orthopedic surgeon.  PT and OT.    Atrial fibrillation with RVR: She is off of IV Cardizem infusion.  She converted to NSR.  Eliquis on hold because of right knee lipohemarthrosis and anemia.  Acute blood loss anemia: Hemoglobin improved s/p 2 units of PRBCs on 01/07/2021.  Hypotension: Discontinue IV fluids to avoid fluid overload.  Monitor BP off of IV  fluids.  SIRS: No evidence of infection at this time.  Antibiotics have been discontinued.  Hypomagnesemia: Replete magnesium level.  Dementia: Continue Aricept     Diet Order            Diet Heart Room service appropriate? Yes; Fluid consistency: Thin  Diet effective now                    Consultants:  Cardiologist  Orthopedic surgeon  Procedures:  None    Medications:   . sodium chloride   Intravenous Once  . sodium chloride   Intravenous Once  . acidophilus   Oral Daily  . vitamin C  1,000 mg Oral Daily  . calcium carbonate  1,250 mg Oral Q breakfast  . cholecalciferol  2,000 Units Oral Daily  . donepezil  5 mg Oral QHS  . dronabinol  2.5 mg Oral QAC lunch  . escitalopram  5 mg Oral Daily  . magnesium oxide  400 mg Oral Daily  . multivitamin with minerals  1 tablet Oral Daily  . vitamin E  400 Units Oral Daily   Continuous Infusions: . sodium chloride 100 mL/hr at 01/08/21 1219     Anti-infectives (From admission, onward)   Start     Dose/Rate Route Frequency Ordered Stop   01/08/21 1000  vancomycin (VANCOREADY) IVPB 500 mg/100 mL  Status:  Discontinued        500 mg 100 mL/hr over 60 Minutes Intravenous Every 36 hours 01/07/21 1217 01/07/21 1410   01/07/21 2100  ceFEPIme (MAXIPIME) 2 g  in sodium chloride 0.9 % 100 mL IVPB  Status:  Discontinued        2 g 200 mL/hr over 30 Minutes Intravenous Every 24 hours 01/06/21 2041 01/07/21 1410   01/07/21 2100  vancomycin (VANCOREADY) IVPB 500 mg/100 mL  Status:  Discontinued        500 mg 100 mL/hr over 60 Minutes Intravenous Every 48 hours 01/06/21 2041 01/07/21 1217   01/06/21 2130  ceFEPIme (MAXIPIME) 2 g in sodium chloride 0.9 % 100 mL IVPB  Status:  Discontinued        2 g 200 mL/hr over 30 Minutes Intravenous  Once 01/06/21 2129 01/06/21 2148   01/06/21 2130  metroNIDAZOLE (FLAGYL) IVPB 500 mg  Status:  Discontinued        500 mg 100 mL/hr over 60 Minutes Intravenous Every 8 hours 01/06/21 2129  01/06/21 2148   01/06/21 2130  vancomycin (VANCOCIN) IVPB 1000 mg/200 mL premix  Status:  Discontinued        1,000 mg 200 mL/hr over 60 Minutes Intravenous  Once 01/06/21 2129 01/06/21 2149   01/06/21 2030  ceFEPIme (MAXIPIME) 2 g in sodium chloride 0.9 % 100 mL IVPB        2 g 200 mL/hr over 30 Minutes Intravenous  Once 01/06/21 2017 01/06/21 2250   01/06/21 2030  metroNIDAZOLE (FLAGYL) IVPB 500 mg  Status:  Discontinued        500 mg 100 mL/hr over 60 Minutes Intravenous Every 8 hours 01/06/21 2017 01/07/21 1410   01/06/21 2030  vancomycin (VANCOREADY) IVPB 750 mg/150 mL        750 mg 150 mL/hr over 60 Minutes Intravenous  Once 01/06/21 2017 01/07/21 0029             Family Communication/Anticipated D/C date and plan/Code Status   DVT prophylaxis: Place and maintain sequential compression device Start: 01/08/21 1004 SCDs Start: 01/06/21 2125     Code Status: Full Code  Family Communication: None Disposition Plan:    Status is: Inpatient  Remains inpatient appropriate because:IV treatments appropriate due to intensity of illness or inability to take PO and Inpatient level of care appropriate due to severity of illness   Dispo: The patient is from: Home              Anticipated d/c is to: Home              Anticipated d/c date is: 3 days              Patient currently is not medically stable to d/c.   Difficult to place patient No           Subjective:   Interval events noted.  She is unable to provide any history at this time.  Objective:    Vitals:   01/07/21 2145 01/08/21 0333 01/08/21 0812 01/08/21 1200  BP: 116/60 (!) 108/50 95/69 (!) 96/57  Pulse: 88 (!) 108 85 74  Resp:  19 20 18   Temp: 98.2 F (36.8 C) 98.2 F (36.8 C) 98.4 F (36.9 C)   TempSrc: Oral Oral    SpO2: 97% 90% 95%   Weight:  35.4 kg    Height:       No data found.   Intake/Output Summary (Last 24 hours) at 01/08/2021 1336 Last data filed at 01/08/2021 0600 Gross per 24  hour  Intake 1236.56 ml  Output 450 ml  Net 786.56 ml   Filed Weights   01/06/21 1547  01/08/21 0333  Weight: 31.8 kg 35.4 kg    Exam:  GEN: NAD SKIN: Warm and dry EYES: No pallor or icterus ENT: MMM CV: Regular rate and rhythm PULM: CTA B ABD: soft, ND, NT, +BS CNS: Sleepy but arousable EXT: Left arm in a sling.  Brace on right knee.     Data Reviewed:   I have personally reviewed following labs and imaging studies:  Labs: Labs show the following:   Basic Metabolic Panel: Recent Labs  Lab 01/06/21 1548 01/06/21 2041 01/07/21 0520  NA 143 144 144  K 4.0 3.7 3.8  CL 105 110 116*  CO2 25 26 22   GLUCOSE 171* 151* 146*  BUN 15 16 16   CREATININE 1.17* 1.08* 0.75  CALCIUM 9.0 8.1* 6.7*  MG  --   --  1.6*   GFR Estimated Creatinine Clearance: 32.9 mL/min (by C-G formula based on SCr of 0.75 mg/dL). Liver Function Tests: Recent Labs  Lab 01/06/21 2041  AST 23  ALT 21  ALKPHOS 23*  BILITOT 1.1  PROT 5.4*  ALBUMIN 3.5   No results for input(s): LIPASE, AMYLASE in the last 168 hours. No results for input(s): AMMONIA in the last 168 hours. Coagulation profile Recent Labs  Lab 01/06/21 2041  INR 1.2    CBC: Recent Labs  Lab 01/06/21 1548 01/06/21 2041 01/07/21 0520 01/07/21 1019 01/07/21 2313  WBC 14.9* 17.2* 13.9*  --   --   NEUTROABS  --  15.6*  --   --   --   HGB 12.0 9.1* 7.1* 6.0* 12.3  HCT 37.0 27.5* 21.6* 18.8* 35.9*  MCV 101.1* 100.7* 102.4*  --   --   PLT 175 139* 103*  --   --    Cardiac Enzymes: No results for input(s): CKTOTAL, CKMB, CKMBINDEX, TROPONINI in the last 168 hours. BNP (last 3 results) No results for input(s): PROBNP in the last 8760 hours. CBG: No results for input(s): GLUCAP in the last 168 hours. D-Dimer: No results for input(s): DDIMER in the last 72 hours. Hgb A1c: No results for input(s): HGBA1C in the last 72 hours. Lipid Profile: Recent Labs    01/07/21 0520  CHOL 159  HDL 66  LDLCALC 84  TRIG 44   CHOLHDL 2.4   Thyroid function studies: Recent Labs    01/07/21 0520  TSH 1.344   Anemia work up: Recent Labs    01/07/21 0520  VITAMINB12 606  FERRITIN 141  TIBC 182*  IRON 15*   Sepsis Labs: Recent Labs  Lab 01/06/21 1548 01/06/21 2041 01/07/21 0520  PROCALCITON  --  0.29  --   WBC 14.9* 17.2* 13.9*  LATICACIDVEN  --  2.3* 1.8    Microbiology Recent Results (from the past 240 hour(s))  SARS Coronavirus 2 by RT PCR (hospital order, performed in [facility] hospital lab) Nasopharyngeal Nasopharyngeal Swab     Status: None   Collection Time: 01/06/21  8:41 PM   Specimen: Nasopharyngeal Swab  Result Value Ref Range Status   SARS Coronavirus 2 NEGATIVE NEGATIVE Final    Comment: (NOTE) SARS-CoV-2 target nucleic acids are NOT DETECTED.  The SARS-CoV-2 RNA is generally detectable in upper and lower respiratory specimens during the acute phase of infection. The lowest concentration of SARS-CoV-2 viral copies this assay can detect is 250 copies / mL. A negative result does not preclude SARS-CoV-2 infection and should not be used as the sole basis for treatment or other patient management decisions.  A negative result  may occur with improper specimen collection / handling, submission of specimen other than nasopharyngeal swab, presence of viral mutation(s) within the areas targeted by this assay, and inadequate number of viral copies (<250 copies / mL). A negative result must be combined with clinical observations, patient history, and epidemiological information.  Fact Sheet for Patients:   StrictlyIdeas.no  Fact Sheet for Healthcare Providers: BankingDealers.co.za  This test is not yet approved or  cleared by the [place] FDA and has been authorized for detection and/or diagnosis of SARS-CoV-2 by FDA under an Emergency Use Authorization (EUA).  This EUA will remain in effect (meaning this test can be used) for  the duration of the COVID-19 declaration under Section 564(b)(1) of the Act, 21 U.S.C. section 360bbb-3(b)(1), unless the authorization is terminated or revoked sooner.  Performed at [facility], [address] [place], [place] [address]   Culture, blood (x 2)     Status: None (Preliminary result)   Collection Time: 01/06/21  8:41 PM   Specimen: BLOOD  Result Value Ref Range Status   Specimen Description BLOOD LEFT ANTECUBITAL  Final   Special Requests   Final    BOTTLES DRAWN AEROBIC AND ANAEROBIC Blood Culture adequate volume   Culture   Final    NO GROWTH 1 DAY Performed at [facility], [address], [place], [place] [address]    Report Status PENDING  Incomplete  Culture, blood (x 2)     Status: None (Preliminary result)   Collection Time: 01/06/21  8:41 PM   Specimen: BLOOD  Result Value Ref Range Status   Specimen Description BLOOD RIGHT ANTECUBITAL  Final   Special Requests   Final    BOTTLES DRAWN AEROBIC AND ANAEROBIC Blood Culture adequate volume   Culture   Final    NO GROWTH 1 DAY Performed at [facility], [address] [place],  [address]    Report Status PENDING  Incomplete    Procedures and diagnostic studies:  DG Pelvis 1-2 Views  Result Date: 01/06/2021 CLINICAL DATA:  Unwitnessed fall at home. EXAM: PELVIS - 1-2 VIEW COMPARISON:  None. FINDINGS: Both hips are normally located. No definite acute hip fracture. The pubic symphysis and SI joints are intact. No definite pelvic fractures. IMPRESSION: No acute bony findings. Electronically Signed   By: Marijo Sanes M.D.   On: 01/06/2021 17:21   DG Wrist 2 Views Left  Result Date: 01/07/2021 CLINICAL DATA:  Left wrist pain after fall yesterday EXAM: LEFT WRIST - 2 VIEW COMPARISON:  None. FINDINGS: Frontal and lateral views of the left wrist are obtained. There is an impacted fracture of the distal left radius with dorsal angulation. There appears to be intra-articular  extension on the frontal view. Small displaced ulnar styloid fracture also noted. The radiocarpal joint remains intact. Incidental lunotriquetral coalition, and anatomic variant. There is diffuse soft tissue edema of the left wrist and distal forearm. IMPRESSION: 1. Impacted intra-articular fracture of the distal left radius, with dorsal angulation at the radiocarpal joint. 2. Minimally displaced ulnar styloid fracture. 3. Diffuse soft tissue edema. Electronically Signed   By: Randa Ngo M.D.   On: 01/07/2021 15:02   CT Head Wo Contrast  Result Date: 01/06/2021 CLINICAL DATA:  Patient found down on floor by has bone, unknown mechanism. Patient endorses neck and back pain. EXAM: CT HEAD WITHOUT CONTRAST CT CERVICAL SPINE WITHOUT CONTRAST TECHNIQUE: Multidetector CT imaging of the head and cervical spine was performed following the standard protocol  without intravenous contrast. Multiplanar CT image reconstructions of the cervical spine were also generated. COMPARISON:  None. FINDINGS: CT HEAD FINDINGS Study is slightly motion degraded. Brain: No evidence of acute infarction, hemorrhage, hydrocephalus, extra-axial collection or mass lesion/mass effect. Global parenchymal volume loss with ex vacuo dilatation of the ventricular system. Mild volume of chronic ischemic white matter disease. 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 Vascular: No hyperdense vessel or unexpected calcification. Skull: Normal. Negative for fracture or focal lesion. Sinuses/Orbits: Paranasal sinuses are clear.  Prior lens surgery. Other: None CT CERVICAL SPINE FINDINGS Study is slightly motion degraded. Alignment: Normal. Skull base and vertebrae: No acute fracture. No primary bone lesion or focal pathologic process. Soft tissues and spinal canal: No prevertebral fluid or swelling. No visible canal hematoma. Disc levels: Multilevel degenerative changes spine with disc space narrowing, disc osteophyte complex ease, uncovertebral and  facet hypertrophy, most significant in the lower cervical spine. Upper chest: Biapical scarring. Other: None IMPRESSION: Study is slightly motion degraded.  Within this context: 1.  There is no acute intracranial abnormality 2.  No visualized fracture or subluxation of the cervical spine. Electronically Signed   By: Dahlia Bailiff MD   On: 01/06/2021 16:27   CT Cervical Spine Wo Contrast  Result Date: 01/06/2021 CLINICAL DATA:  Patient found down on floor by has bone, unknown mechanism. Patient endorses neck and back pain. EXAM: CT HEAD WITHOUT CONTRAST CT CERVICAL SPINE WITHOUT CONTRAST TECHNIQUE: Multidetector CT imaging of the head and cervical spine was performed following the standard protocol without intravenous contrast. Multiplanar CT image reconstructions of the cervical spine were also generated. COMPARISON:  None. FINDINGS: CT HEAD FINDINGS Study is slightly motion degraded. Brain: No evidence of acute infarction, hemorrhage, hydrocephalus, extra-axial collection or mass lesion/mass effect. Global parenchymal volume loss with ex vacuo dilatation of the ventricular system. Mild volume of chronic ischemic white matter disease. 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 Vascular: No hyperdense vessel or unexpected calcification. Skull: Normal. Negative for fracture or focal lesion. Sinuses/Orbits: Paranasal sinuses are clear.  Prior lens surgery. Other: None CT CERVICAL SPINE FINDINGS Study is slightly motion degraded. Alignment: Normal. Skull base and vertebrae: No acute fracture. No primary bone lesion or focal pathologic process. Soft tissues and spinal canal: No prevertebral fluid or swelling. No visible canal hematoma. Disc levels: Multilevel degenerative changes spine with disc space narrowing, disc osteophyte complex ease, uncovertebral and facet hypertrophy, most significant in the lower cervical spine. Upper chest: Biapical scarring. Other: None IMPRESSION: Study is slightly motion degraded.   Within this context: 1.  There is no acute intracranial abnormality 2.  No visualized fracture or subluxation of the cervical spine. Electronically Signed   By: Dahlia Bailiff MD   On: 01/06/2021 16:27   CT Knee Right Wo Contrast  Result Date: 01/06/2021 CLINICAL DATA:  [place].  Abnormal x-rays. EXAM: CT OF THE right KNEE WITHOUT CONTRAST TECHNIQUE: Multidetector CT imaging of the right knee was performed according to the standard protocol. Multiplanar CT image reconstructions were also generated. COMPARISON:  Right knee radiographs, same date. FINDINGS: Nondisplaced but slightly impacted medial tibial plateau fracture. Small fracture lines along the intra-articular portion accounting for the lipohemarthrosis. The tibial spines and lateral tibial plateau are intact. No femur, fibula or patella fractures. Advanced changes of osteoporosis. Moderate-sized lipohemarthrosis is noted. Grossly by CT the cruciate and collateral ligaments are intact. The quadriceps and patellar tendons are intact. IMPRESSION: 1. Nondisplaced but slightly impacted medial tibial plateau fracture. 2.  Moderate-sized lipohemarthrosis. 3. Advanced changes of osteoporosis. 4. No other fractures are identified. Electronically Signed   By: Marijo Sanes M.D.   On: 01/06/2021 19:06   DG Shoulder Left  Result Date: 01/06/2021 CLINICAL DATA:  Found down at home.  Left shoulder pain. EXAM: LEFT SHOULDER - 2+ VIEW COMPARISON:  None. FINDINGS: Nondisplaced fracture noted through the humeral neck and probably involving the base of the greater tuberosity. The [facility] joint is intact. No clavicle fracture. The visualized left ribs are intact and the visualized left lung is grossly clear. IMPRESSION: Nondisplaced left humeral neck fracture. Electronically Signed   By: Marijo Sanes M.D.   On: 01/06/2021 17:17   DG Knee Complete 4 Views Right  Result Date: 01/06/2021 CLINICAL DATA:  Found down at home.  Right knee pain. EXAM: RIGHT KNEE - COMPLETE 4+ VIEW  COMPARISON:  None FINDINGS: The joint spaces are maintained. No acute fracture is identified. However, there is a lipohemarthrosis noted on the lateral film, highly suggestive of fracture. Recommend CT for further evaluation. IMPRESSION: No definite acute fracture. However, suspect lipohemarthrosis highly suggestive of fracture. CT is recommended for further evaluation. Electronically Signed   By: Marijo Sanes M.D.   On: 01/06/2021 17:20   ECHOCARDIOGRAM COMPLETE  Result Date: 01/08/2021    ECHOCARDIOGRAM REPORT   Patient Name:   Haley Kim Date of Exam: 01/07/2021 Medical Rec #:  [id number]          Height:       66.3 in Accession #:    [id number]         Weight:       70.0 lb Date of Birth:  July 14, 1946          BSA:          1.284 m Patient Age:    65 years           BP:           121/64 mmHg Patient Gender: F                  HR:           105 bpm. Exam Location:  [facility] Procedure: 2D Echo, Cardiac Doppler and Color Doppler Indications:    Atrial Fibrillation I48.91  History:        Patient has no prior history of Echocardiogram examinations.                 COPD.  Sonographer:    Sherrie Sport RDCS (AE) Referring Phys: [id number] Mary Sella A MANSY Diagnosing      Yolonda Kida MD Phys:  Sonographer Comments: Technically difficult study due to poor echo windows, no apical window and suboptimal parasternal window. Image acquisition challenging due to patient body habitus and Image acquisition challenging due to COPD. IMPRESSIONS  1. Left ventricular ejection fraction, by estimation, is 50 to 55%. The left ventricle has low normal function. The left ventricle has no regional wall motion abnormalities. Left ventricular diastolic parameters were normal.  2. Right ventricular systolic function is normal. The right ventricular size is normal.  3. The mitral valve is normal in structure. No evidence of mitral valve regurgitation.  4. The aortic valve is normal in structure. Aortic valve regurgitation is not visualized.  Conclusion(s)/Recommendation(s): Poor windows for evaluation of left ventricular function by transthoracic echocardiography. Would recommend an alternative means of evaluation. FINDINGS  Left Ventricle: Left ventricular ejection fraction, by estimation, is 50 to 55%. The left ventricle  has low normal function. The left ventricle has no regional wall motion abnormalities. The left ventricular internal cavity size was normal in size. There is no left ventricular hypertrophy. Left ventricular diastolic parameters were normal. Right Ventricle: The right ventricular size is normal. No increase in right ventricular wall thickness. Right ventricular systolic function is normal. Left Atrium: Left atrial size was normal in size. Right Atrium: Right atrial size was normal in size. Pericardium: There is no evidence of pericardial effusion. Mitral Valve: The mitral valve is normal in structure. No evidence of mitral valve regurgitation. Tricuspid Valve: The tricuspid valve is not well visualized. Tricuspid valve regurgitation is trivial. Aortic Valve: The aortic valve is normal in structure. Aortic valve regurgitation is not visualized. Pulmonic Valve: The pulmonic valve was normal in structure. Pulmonic valve regurgitation is not visualized. Aorta: The aortic arch was not well visualized. IAS/Shunts: No atrial level shunt detected by color flow Doppler.  LEFT VENTRICLE PLAX 2D LVIDd:         3.23 cm LVIDs:         2.59 cm LV PW:         0.83 cm LV IVS:        0.61 cm LVOT diam:     2.00 cm LVOT Area:     3.14 cm  LEFT ATRIUM         Index LA diam:    2.40 cm 1.87 cm/m                        PULMONIC VALVE AORTA                 PV Vmax:        0.77 m/s Ao Root diam: 2.95 cm PV Peak grad:   2.4 mmHg                       RVOT Peak grad: 2 mmHg   SHUNTS Systemic Diam: 2.00 cm Haley Prince [place] MD Electronically signed by Yolonda Kida MD Signature Date/Time: 01/08/2021/8:09:16 AM    Final                LOS: 2  days   Dazani Norby  [facility]   Pager on [email or web address]. If 7PM-7AM, please contact night-coverage at [email or web address]     01/08/2021, 1:36 PM

## 2021-01-09 ENCOUNTER — Other Ambulatory Visit: Payer: Self-pay

## 2021-01-09 LAB — CBC WITH DIFFERENTIAL/PLATELET
Abs Immature Granulocytes: 0.09 10*3/uL — ABNORMAL HIGH (ref 0.00–0.07)
Basophils Absolute: 0 10*3/uL (ref 0.0–0.1)
Basophils Relative: 0 %
Eosinophils Absolute: 0.1 10*3/uL (ref 0.0–0.5)
Eosinophils Relative: 1 %
HCT: 37.4 % (ref 36.0–46.0)
Hemoglobin: 12.9 g/dL (ref 12.0–15.0)
Immature Granulocytes: 1 %
Lymphocytes Relative: 4 %
Lymphs Abs: 0.5 10*3/uL — ABNORMAL LOW (ref 0.7–4.0)
MCH: 32.7 pg (ref 26.0–34.0)
MCHC: 34.5 g/dL (ref 30.0–36.0)
MCV: 94.9 fL (ref 80.0–100.0)
Monocytes Absolute: 1 10*3/uL (ref 0.1–1.0)
Monocytes Relative: 7 %
Neutro Abs: 12.3 10*3/uL — ABNORMAL HIGH (ref 1.7–7.7)
Neutrophils Relative %: 87 %
Platelets: 107 10*3/uL — ABNORMAL LOW (ref 150–400)
RBC: 3.94 MIL/uL (ref 3.87–5.11)
RDW: 16.7 % — ABNORMAL HIGH (ref 11.5–15.5)
WBC: 14 10*3/uL — ABNORMAL HIGH (ref 4.0–10.5)
nRBC: 0 % (ref 0.0–0.2)

## 2021-01-09 LAB — BASIC METABOLIC PANEL
Anion gap: 7 (ref 5–15)
BUN: 19 mg/dL (ref 8–23)
CO2: 25 mmol/L (ref 22–32)
Calcium: 8.6 mg/dL — ABNORMAL LOW (ref 8.9–10.3)
Chloride: 109 mmol/L (ref 98–111)
Creatinine, Ser: 0.88 mg/dL (ref 0.44–1.00)
GFR, Estimated: >60 mL/min (ref >60)
Glucose, Bld: 103 mg/dL — ABNORMAL HIGH (ref 70–99)
Potassium: 4.7 mmol/L (ref 3.5–5.1)
Sodium: 141 mmol/L (ref 135–145)

## 2021-01-09 LAB — MAGNESIUM: Magnesium: 2.2 mg/dL (ref 1.7–2.4)

## 2021-01-09 MED ORDER — METOPROLOL TARTRATE 25 MG PO TABS
12.5000 mg | ORAL_TABLET | Freq: Two times a day (BID) | ORAL | Status: DC
  Administered 2021-01-09 – 2021-01-16 (×15): 12.5 mg via ORAL
  Filled 2021-01-09 (×16): qty 1

## 2021-01-09 MED ORDER — METOPROLOL TARTRATE 5 MG/5ML IV SOLN
2.5000 mg | Freq: Four times a day (QID) | INTRAVENOUS | Status: DC | PRN
  Administered 2021-01-09: 2.5 mg via INTRAVENOUS
  Filled 2021-01-09: qty 5

## 2021-01-09 MED ORDER — APIXABAN 2.5 MG PO TABS
2.5000 mg | ORAL_TABLET | Freq: Two times a day (BID) | ORAL | Status: DC
  Administered 2021-01-09 – 2021-01-12 (×7): 2.5 mg via ORAL
  Filled 2021-01-09 (×7): qty 1

## 2021-01-09 NOTE — Progress Notes (Signed)
Progress Note    Haley Kim  HGD:[id number] DOB: 1946-07-11  DOA: 01/06/2021 PCP: Adin Hector, MD      Brief Narrative:    Medical records reviewed and are as summarized below:  Haley Kim is a 75 y.o. female COPD, dyslipidemia, dementia, who presented to the hospital after a fall at home.  Reportedly, there was no loss of consciousness.  In the ED, she was tachycardic and she was found to have atrial fibrillation with RVR.  Work-up revealed left humeral neck fracture, right medial tibial plateau fracture complicated by moderate-sized lipohemarthrosis, fracture of the distal left radius and minimally displaced left ulnar styloid fracture.  She was treated with IV Cardizem infusion for rapid A. fib.  She developed acute blood loss anemia requiring 2 units of packed red blood cells.  She was also treated with IV fluids and analgesics.  She was evaluated by the orthopedic surgeon who recommended conservative management.  Assessment/Plan:   Principal Problem:   Fall at home Active Problems:   Atrial fibrillation with RVR (HCC)   Left humeral fracture   Tibial plateau fracture, right   Acute blood loss anemia   Body mass index is 12.64 kg/m.  (Underweight): This complicates overall prognosis.  Consult dietitian.  S/p fall, left humeral neck fracture, right medial tibial plateau fracture, moderate sized lipohemarthrosis, distal left radius fracture, minimally displaced left ulnar styloid fracture: Conservative management recommended. Per ortho "Avoid any weightbearing through the left upper extremity or shoulder". Continue analgesics as needed for pain.  Follow-up in orthopedic surgeon. PT and OT currently recommending SNF.    Atrial fibrillation with RVR: She is off of IV Cardizem infusion this am - plan to restart per cardiology. Resume Eliquis; right knee lipohemarthrosis appears stable and anemia is resolved  Acute blood loss anemia: Hemoglobin improved  s/p 2 units of PRBCs on 01/07/2021. R knee lipohemarthrosis and somewhat dilutional. Rebounded nearly 7 points with just 2 units at admission.   Hypotension: Discontinue IV fluids to avoid fluid overload.  Monitor BP off of IV fluids.  SIRS: In the setting of trauma. No evidence of infection at this time; does not meet sepsis criteria.  Antibiotics have been discontinued.  Hypomagnesemia, resolved  Dementia: Continue Aricept  Consultants:  Cardiologist  Orthopedic surgeon  Procedures:  None planned  Medications:   . sodium chloride   Intravenous Once  . sodium chloride   Intravenous Once  . acidophilus   Oral Daily  . vitamin C  1,000 mg Oral Daily  . calcium carbonate  1,250 mg Oral Q breakfast  . cholecalciferol  2,000 Units Oral Daily  . donepezil  5 mg Oral QHS  . dronabinol  2.5 mg Oral QAC lunch  . escitalopram  5 mg Oral Daily  . magnesium oxide  400 mg Oral Daily  . multivitamin with minerals  1 tablet Oral Daily  . vitamin E  400 Units Oral Daily   Continuous Infusions:    Anti-infectives (From admission, onward)   Start     Dose/Rate Route Frequency Ordered Stop   01/08/21 1000  vancomycin (VANCOREADY) IVPB 500 mg/100 mL  Status:  Discontinued        500 mg 100 mL/hr over 60 Minutes Intravenous Every 36 hours 01/07/21 1217 01/07/21 1410   01/07/21 2100  ceFEPIme (MAXIPIME) 2 g in sodium chloride 0.9 % 100 mL IVPB  Status:  Discontinued        2 g 200 mL/hr  over 30 Minutes Intravenous Every 24 hours 01/06/21 2041 01/07/21 1410   01/07/21 2100  vancomycin (VANCOREADY) IVPB 500 mg/100 mL  Status:  Discontinued        500 mg 100 mL/hr over 60 Minutes Intravenous Every 48 hours 01/06/21 2041 01/07/21 1217   01/06/21 2130  ceFEPIme (MAXIPIME) 2 g in sodium chloride 0.9 % 100 mL IVPB  Status:  Discontinued        2 g 200 mL/hr over 30 Minutes Intravenous  Once 01/06/21 2129 01/06/21 2148   01/06/21 2130  metroNIDAZOLE (FLAGYL) IVPB 500 mg  Status:  Discontinued         500 mg 100 mL/hr over 60 Minutes Intravenous Every 8 hours 01/06/21 2129 01/06/21 2148   01/06/21 2130  vancomycin (VANCOCIN) IVPB 1000 mg/200 mL premix  Status:  Discontinued        1,000 mg 200 mL/hr over 60 Minutes Intravenous  Once 01/06/21 2129 01/06/21 2149   01/06/21 2030  ceFEPIme (MAXIPIME) 2 g in sodium chloride 0.9 % 100 mL IVPB        2 g 200 mL/hr over 30 Minutes Intravenous  Once 01/06/21 2017 01/06/21 2250   01/06/21 2030  metroNIDAZOLE (FLAGYL) IVPB 500 mg  Status:  Discontinued        500 mg 100 mL/hr over 60 Minutes Intravenous Every 8 hours 01/06/21 2017 01/07/21 1410   01/06/21 2030  vancomycin (VANCOREADY) IVPB 750 mg/150 mL        750 mg 150 mL/hr over 60 Minutes Intravenous  Once 01/06/21 2017 01/07/21 0029             Family Communication/Anticipated D/C date and plan/Code Status   DVT prophylaxis: Place and maintain sequential compression device Start: 01/08/21 1004 SCDs Start: 01/06/21 2125     Code Status: Full Code  Family Communication: Husband at bedside, lengthy discussion about disposition, prognosis and lengthy recovery in the setting of multiple fractures advanced age and poor nutrition.  Disposition Plan:  Status is: Inpatient  Remains inpatient appropriate because:IV treatments appropriate due to intensity of illness or inability to take PO and Inpatient level of care appropriate due to severity of illness   Dispo: The patient is from: Home              Anticipated d/c is to: SNF              Anticipated d/c date is: 48-72h              Patient currently is not medically stable to d/c.   Difficult to place patient No  Subjective:   No acute issues or events overnight, back in A. fib with RVR this morning off dill drip but otherwise denies chest pain shortness of breath nausea vomiting diarrhea constipation headache fevers or chills  Objective:    Vitals:   01/08/21 1800 01/08/21 1956 01/09/21 0556 01/09/21 0810  BP:  133/68   113/84  Pulse:  (!) 109  (!) 110  Resp:  20  16  Temp:  (!) 97.5 F (36.4 C)  (!) 97.4 F (36.3 C)  TempSrc:  Oral  Oral  SpO2:  92%  93%  Weight:   35.8 kg   Height:       No data found.  No intake or output data in the 24 hours ending 01/09/21 0826 Filed Weights   01/06/21 1547 01/08/21 0333 01/09/21 0556  Weight: 31.8 kg 35.4 kg 35.8 kg    Exam:  GEN: NAD SKIN: Warm and dry EYES: No pallor or icterus ENT: MMM CV: Regular rate and rhythm PULM: CTA B ABD: soft, ND, NT, +BS CNS: Sleepy but arousable EXT: Left arm in a sling.  Brace on right knee.   Data Reviewed:   I have personally reviewed following labs and imaging studies:  Labs: Labs show the following:   Basic Metabolic Panel: Recent Labs  Lab 01/06/21 1548 01/06/21 2041 01/07/21 0520 01/09/21 0627  NA 143 144 144 141  K 4.0 3.7 3.8 4.7  CL 105 110 116* 109  CO2 25 26 22 25   GLUCOSE 171* 151* 146* 103*  BUN 15 16 16 19   CREATININE 1.17* 1.08* 0.75 0.88  CALCIUM 9.0 8.1* 6.7* 8.6*  MG  --   --  1.6* 2.2   GFR Estimated Creatinine Clearance: 30.3 mL/min (by C-G formula based on SCr of 0.88 mg/dL). Liver Function Tests: Recent Labs  Lab 01/06/21 2041  AST 23  ALT 21  ALKPHOS 23*  BILITOT 1.1  PROT 5.4*  ALBUMIN 3.5   No results for input(s): LIPASE, AMYLASE in the last 168 hours. No results for input(s): AMMONIA in the last 168 hours. Coagulation profile Recent Labs  Lab 01/06/21 2041  INR 1.2    CBC: Recent Labs  Lab 01/06/21 1548 01/06/21 2041 01/07/21 0520 01/07/21 1019 01/07/21 2313 01/09/21 0627  WBC 14.9* 17.2* 13.9*  --   --  14.0*  NEUTROABS  --  15.6*  --   --   --  12.3*  HGB 12.0 9.1* 7.1* 6.0* 12.3 12.9  HCT 37.0 27.5* 21.6* 18.8* 35.9* 37.4  MCV 101.1* 100.7* 102.4*  --   --  94.9  PLT 175 139* 103*  --   --  107*   Cardiac Enzymes: No results for input(s): CKTOTAL, CKMB, CKMBINDEX, TROPONINI in the last 168 hours. BNP (last 3 results) No results  for input(s): PROBNP in the last 8760 hours. CBG: No results for input(s): GLUCAP in the last 168 hours. D-Dimer: No results for input(s): DDIMER in the last 72 hours. Hgb A1c: No results for input(s): HGBA1C in the last 72 hours. Lipid Profile: Recent Labs    01/07/21 0520  CHOL 159  HDL 66  LDLCALC 84  TRIG 44  CHOLHDL 2.4   Thyroid function studies: Recent Labs    01/07/21 0520  TSH 1.344   Anemia work up: Recent Labs    01/07/21 0520  VITAMINB12 606  FERRITIN 141  TIBC 182*  IRON 15*   Sepsis Labs: Recent Labs  Lab 01/06/21 1548 01/06/21 2041 01/07/21 0520 01/09/21 0627  PROCALCITON  --  0.29  --   --   WBC 14.9* 17.2* 13.9* 14.0*  LATICACIDVEN  --  2.3* 1.8  --     Microbiology Recent Results (from the past 240 hour(s))  SARS Coronavirus 2 by RT PCR (hospital order, performed in [facility] hospital lab) Nasopharyngeal Nasopharyngeal Swab     Status: None   Collection Time: 01/06/21  8:41 PM   Specimen: Nasopharyngeal Swab  Result Value Ref Range Status   SARS Coronavirus 2 NEGATIVE NEGATIVE Final    Comment: (NOTE) SARS-CoV-2 target nucleic acids are NOT DETECTED.  The SARS-CoV-2 RNA is generally detectable in upper and lower respiratory specimens during the acute phase of infection. The lowest concentration of SARS-CoV-2 viral copies this assay can detect is 250 copies / mL. A negative result does not preclude SARS-CoV-2 infection and should not be used as the  sole basis for treatment or other patient management decisions.  A negative result may occur with improper specimen collection / handling, submission of specimen other than nasopharyngeal swab, presence of viral mutation(s) within the areas targeted by this assay, and inadequate number of viral copies (<250 copies / mL). A negative result must be combined with clinical observations, patient history, and epidemiological information.  Fact Sheet for Patients:    StrictlyIdeas.no  Fact Sheet for Healthcare Providers: BankingDealers.co.za  This test is not yet approved or  cleared by the [place] FDA and has been authorized for detection and/or diagnosis of SARS-CoV-2 by FDA under an Emergency Use Authorization (EUA).  This EUA will remain in effect (meaning this test can be used) for the duration of the COVID-19 declaration under Section 564(b)(1) of the Act, 21 U.S.C. section 360bbb-3(b)(1), unless the authorization is terminated or revoked sooner.  Performed at [facility], [address] [place], [place] [address]   Culture, blood (x 2)     Status: None (Preliminary result)   Collection Time: 01/06/21  8:41 PM   Specimen: BLOOD  Result Value Ref Range Status   Specimen Description BLOOD LEFT ANTECUBITAL  Final   Special Requests   Final    BOTTLES DRAWN AEROBIC AND ANAEROBIC Blood Culture adequate volume   Culture   Final    NO GROWTH 2 DAYS Performed at [facility], [address] [place], [place] [address]    Report Status PENDING  Incomplete  Culture, blood (x 2)     Status: None (Preliminary result)   Collection Time: 01/06/21  8:41 PM   Specimen: BLOOD  Result Value Ref Range Status   Specimen Description BLOOD RIGHT ANTECUBITAL  Final   Special Requests   Final    BOTTLES DRAWN AEROBIC AND ANAEROBIC Blood Culture adequate volume   Culture   Final    NO GROWTH 2 DAYS Performed at [facility], [address], [place], [place] [address]    Report Status PENDING  Incomplete    Procedures and diagnostic studies:  DG Wrist 2 Views Left  Result Date: 01/07/2021 CLINICAL DATA:  Left wrist pain after fall yesterday EXAM: LEFT WRIST - 2 VIEW COMPARISON:  None. FINDINGS: Frontal and lateral views of the left wrist are obtained. There is an impacted fracture of the distal left radius with dorsal angulation. There appears to be intra-articular  extension on the frontal view. Small displaced ulnar styloid fracture also noted. The radiocarpal joint remains intact. Incidental lunotriquetral coalition, and anatomic variant. There is diffuse soft tissue edema of the left wrist and distal forearm. IMPRESSION: 1. Impacted intra-articular fracture of the distal left radius, with dorsal angulation at the radiocarpal joint. 2. Minimally displaced ulnar styloid fracture. 3. Diffuse soft tissue edema. Electronically Signed   By: Randa Ngo M.D.   On: 01/07/2021 15:02   ECHOCARDIOGRAM COMPLETE  Result Date: 01/08/2021    ECHOCARDIOGRAM REPORT   Patient Name:   BARI LEIB Date of Exam: 01/07/2021 Medical Rec #:  [id number]          Height:       66.3 in Accession #:    [id number]         Weight:       70.0 lb Date of Birth:  02/06/1946          BSA:          1.284 m Patient Age:    74 years  BP:           121/64 mmHg Patient Gender: F                  HR:           105 bpm. Exam Location:  [facility] Procedure: 2D Echo, Cardiac Doppler and Color Doppler Indications:    Atrial Fibrillation I48.91  History:        Patient has no prior history of Echocardiogram examinations.                 COPD.  Sonographer:    Sherrie Sport RDCS (AE) Referring Phys: [id number] Mary Sella A MANSY Diagnosing      Yolonda Kida MD Phys:  Sonographer Comments: Technically difficult study due to poor echo windows, no apical window and suboptimal parasternal window. Image acquisition challenging due to patient body habitus and Image acquisition challenging due to COPD. IMPRESSIONS  1. Left ventricular ejection fraction, by estimation, is 50 to 55%. The left ventricle has low normal function. The left ventricle has no regional wall motion abnormalities. Left ventricular diastolic parameters were normal.  2. Right ventricular systolic function is normal. The right ventricular size is normal.  3. The mitral valve is normal in structure. No evidence of mitral valve regurgitation.  4. The  aortic valve is normal in structure. Aortic valve regurgitation is not visualized. Conclusion(s)/Recommendation(s): Poor windows for evaluation of left ventricular function by transthoracic echocardiography. Would recommend an alternative means of evaluation. FINDINGS  Left Ventricle: Left ventricular ejection fraction, by estimation, is 50 to 55%. The left ventricle has low normal function. The left ventricle has no regional wall motion abnormalities. The left ventricular internal cavity size was normal in size. There is no left ventricular hypertrophy. Left ventricular diastolic parameters were normal. Right Ventricle: The right ventricular size is normal. No increase in right ventricular wall thickness. Right ventricular systolic function is normal. Left Atrium: Left atrial size was normal in size. Right Atrium: Right atrial size was normal in size. Pericardium: There is no evidence of pericardial effusion. Mitral Valve: The mitral valve is normal in structure. No evidence of mitral valve regurgitation. Tricuspid Valve: The tricuspid valve is not well visualized. Tricuspid valve regurgitation is trivial. Aortic Valve: The aortic valve is normal in structure. Aortic valve regurgitation is not visualized. Pulmonic Valve: The pulmonic valve was normal in structure. Pulmonic valve regurgitation is not visualized. Aorta: The aortic arch was not well visualized. IAS/Shunts: No atrial level shunt detected by color flow Doppler.  LEFT VENTRICLE PLAX 2D LVIDd:         3.23 cm LVIDs:         2.59 cm LV PW:         0.83 cm LV IVS:        0.61 cm LVOT diam:     2.00 cm LVOT Area:     3.14 cm  LEFT ATRIUM         Index LA diam:    2.40 cm 1.87 cm/m                        PULMONIC VALVE AORTA                 PV Vmax:        0.77 m/s Ao Root diam: 2.95 cm PV Peak grad:   2.4 mmHg  RVOT Peak grad: 2 mmHg   SHUNTS Systemic Diam: 2.00 cm Yolonda Kida MD Electronically signed by Yolonda Kida MD  Signature Date/Time: 01/08/2021/8:09:16 AM    Final      LOS: 3 days   [facility]   Pager via secure chat  If 7PM-7AM, please contact night-coverage at [email or web address]  01/09/2021, 8:26 AM

## 2021-01-09 NOTE — Plan of Care (Signed)

## 2021-01-09 NOTE — Evaluation (Signed)
Physical Therapy Evaluation Patient Details Name: Haley Kim MRN: [id number] DOB: July 05, 1946 Today's Date: 01/09/2021   History of Present Illness  75 y.o. female COPD, dyslipidemia, dementia, who presented to the hospital after a fall at home.  Reportedly, there was no loss of consciousness.  In the ED, she was tachycardic and she was found to have atrial fibrillation with RVR.  Work-up revealed left humeral neck fracture, right medial tibial plateau fracture complicated by moderate-sized lipohemarthrosis, fracture of the distal left radius and minimally displaced left ulnar styloid fracture.     Clinical Impression  Pt received upright in semi-fowler position and agreeable to therapy.  Husband present in room during evaluation and treatment.  Husband notes that today is the first day that the pt was able to speak in complete sentences and make sense of what she is saying.  Pt was able to speak her name, but unable to identify her husband by his correct name.  Pt also unaware of where she is, time, or DOB.  Pt was able to follow commands during treatment session and only required redirection twice.  Pt responds to tactile cuing well and has decent carryover when performing exercises.  Pt and husband given exercises to perform throughout hospital stay and going forward.  Husband requested to not have pt perform bed mobility due to her requesting to go to the restroom throughout the day when she sits upright once.  Pt will benefit from skilled PT intervention to increase independence and safety with basic mobility in preparation for discharge to the venue listed below.       Follow Up Recommendations SNF;Supervision/Assistance - 24 hour    Equipment Recommendations       Recommendations for Other Services       Precautions / Restrictions Precautions Precautions: Fall Required Braces or Orthoses: Knee Immobilizer - Right;Sling;Other Brace Knee Immobilizer - Right: On at all  times Other Brace: wrist brace (not present in room) and sling on L UE NWB Restrictions Weight Bearing Restrictions: Yes LUE Weight Bearing: Non weight bearing RLE Weight Bearing: Non weight bearing      Mobility  Bed Mobility               General bed mobility comments: Husband requested not to move pt to EOB due to pt continuously requesting to go to bathroom when sitting upright.    Transfers                    Ambulation/Gait                Stairs            Wheelchair Mobility    Modified Rankin (Stroke Patients Only)       Balance                                             Pertinent Vitals/Pain Pain Assessment: No/denies pain Pain Intervention(s): Limited activity within patient's tolerance;Monitored during session;Repositioned    Home Living Family/patient expects to be discharged to:: Private residence Living Arrangements: Spouse/significant other Available Help at Discharge: Family;Available PRN/intermittently Type of Home: House Home Access: Stairs to enter Entrance Stairs-Rails: None Entrance Stairs-Number of Steps: 4 Home Layout: Two level;Bed/bath upstairs Home Equipment: Hospital bed;Walker - 2 wheels      Prior Function Level of Independence: Independent  Comments: Husband reports pt was able to dress and wash self without assistance and ambulates without use of AD. However, once pt does these tasks she is so fatigued she is unable to complete any other tasks within the home. He reports she has a "weak back" that does not allow her to do much else but she tries. He also reports she has a "3 second memory"     Hand Dominance   Dominant Hand: Right    Extremity/Trunk Assessment   Upper Extremity Assessment Upper Extremity Assessment: LUE deficits/detail LUE Deficits / Details: NWB    Lower Extremity Assessment RLE Deficits / Details: NWB       Communication   Communication: No  difficulties  Cognition Arousal/Alertness: Awake/alert Behavior During Therapy: Restless Overall Cognitive Status: History of cognitive impairments - at baseline                                 General Comments: Pt is able to follow commands during today's session, only requiring redirection on two seperate accounts.      General Comments      Exercises Total Joint Exercises Ankle Circles/Pumps: AROM;Strengthening;Both;20 reps;Supine Quad Sets: AROM;Strengthening;Both;10 reps;Supine Gluteal Sets: AROM;Strengthening;Both;10 reps;Supine Heel Slides: AROM;Strengthening;Left;10 reps;Supine Hip ABduction/ADduction: AROM;Strengthening;Left;10 reps;Supine Straight Leg Raises: AROM;Strengthening;Left;10 reps;Supine   Assessment/Plan    PT Assessment Patient needs continued PT services  PT Problem List Decreased strength;Decreased range of motion;Decreased activity tolerance;Decreased balance;Decreased mobility;Decreased cognition;Decreased knowledge of use of DME;Decreased safety awareness;Decreased knowledge of precautions;Pain       PT Treatment Interventions DME instruction;Gait training;Stair training;Therapeutic activities;Therapeutic exercise;Balance training;Neuromuscular re-education;Functional mobility training;Patient/family education    PT Goals (Current goals can be found in the Care Plan section)  Acute Rehab PT Goals Patient Stated Goal: husband to be able to take care of her PT Goal Formulation: With patient/family Time For Goal Achievement: 01/23/21 Potential to Achieve Goals: Fair    Frequency 7X/week   Barriers to discharge Inaccessible home environment;Decreased caregiver support Pt would require hospital bed on first floor to be able to go home, as well as increased caregiver support that husband is unsure of him wanting in his home at this time.    Co-evaluation               AM-PAC PT "6 Clicks" Mobility  Outcome Measure Help needed  turning from your back to your side while in a flat bed without using bedrails?: A Lot Help needed moving from lying on your back to sitting on the side of a flat bed without using bedrails?: A Lot Help needed moving to and from a bed to a chair (including a wheelchair)?: A Lot Help needed standing up from a chair using your arms (e.g., wheelchair or bedside chair)?: Total Help needed to walk in hospital room?: Total Help needed climbing 3-5 steps with a railing? : Total 6 Click Score: 9    End of Session Equipment Utilized During Treatment: Right knee immobilizer Activity Tolerance: Patient tolerated treatment well Patient left: in bed;with bed alarm set;with family/visitor present Nurse Communication: Mobility status PT Visit Diagnosis: Unsteadiness on feet (R26.81);Other abnormalities of gait and mobility (R26.89);Repeated falls (R29.6);Muscle weakness (generalized) (M62.81);History of falling (Z91.81);Difficulty in walking, not elsewhere classified (R26.2);Pain Pain - Right/Left: Right Pain - part of body: Leg    Time: 6283-1517 PT Time Calculation (min) (ACUTE ONLY): 31 min   Charges:   PT Evaluation $PT Eval Low Complexity: 1  Low PT Treatments $Therapeutic Exercise: 23-37 mins        Gwenlyn Saran, PT, DPT 01/09/21, 11:19 AM

## 2021-01-09 NOTE — Progress Notes (Signed)
[facility]  Patient Description:Haley Kim is a 75 year old female with PMH significant for mixed Alzheimer's and vascular dementia, hypercholesterolemia and COPD who was admitted to [facility] due to an unwitnessed fall resulting in right medial tibial plateau impacted fracture and left shoulder nondisplaced left humeral neck fracture. Upon ED arrival the patient's ECG revealed atrial fibrillation with RVR, thus cardiology was consulted.   SUBJECTIVE: The patient states that she is doing okay today and she denies having any chest pain or dyspnea at this time.   (01/08/2021)-The patient reports to be doing well on today and she denies having any chest pain or dyspnea at this time.  OBJECTIVE: The patient continues to be confused on today, but appears stable. The patient is now in atrial flutter with episodes of atrial fibrillation with RVR at a HR of 142 bpm at this time. The RN at the bedsides states that the patient is in pain and that her HR has been elevated since earlier this morning prior to 7am. The patient is in a low-fall risk bed with rails up, she is on the monitor and she is sitting up eating her breakfast at this time. The RN reports that the patient has been able to swallow effectively and has also been able to take small, oral pills without any complications.   (04/30/9508)-TOI patient is remarkably confused on today and is only alert and oriented x1.  She was able to tell me that she was not having any pain or shortness of breath; however, she was unable to answer any further questions beyond that due to her confusion.  The patient is now on room air and satting at 97%.  Overnight the patient converted back to normal sinus rhythm and continues to be in normal sinus rhythm with a heart rate of 85 bpm.  Additionally, the patient removed her IVs early this morning, that she has not been on the Cardizem drip at all and has sustained a normal sinus rhythm.  The patient continues to be slightly  hypotensive; however, she appears to be hemodynamically stable and all of her other vital signs are stable.  Vitals:   01/08/21 1800 01/08/21 1956 01/09/21 0556 01/09/21 0810  BP: 133/68   113/84  Pulse:  (!) 109  (!) 110  Resp:  20  16  Temp:  (!) 97.5 F (36.4 C)  (!) 97.4 F (36.3 C)  TempSrc:  Oral  Oral  SpO2:  92%  93%  Weight:   35.8 kg   Height:        No intake or output data in the 24 hours ending 01/09/21 1407    PHYSICAL EXAM  General: Well developed, malnourished, in no acute distress HEENT:  Normocephalic and atraumatic. PERRL.  Neck:   No JVD.  Lungs: Clear bilaterally to auscultation. Chest expansion symmetrical. No wheezes rales or no rhonchi noted. Heart: Irregular heart rate and rhythm. Normal S1 and S2 without gallops or murmurs.  Abdomen: Bowel sounds are positive, abdomen soft and non-tender  Msk: Diminished strength and tone for age. Left arm continues to remain in sling. Extremities: No clubbing, cyanosis or edema.   Neuro: Alert and oriented X 1 Psych:  Good affect, responds inappropriately   LABS: Basic Metabolic Panel: Recent Labs    01/07/21 0520 01/09/21 0627  NA 144 141  K 3.8 4.7  CL 116* 109  CO2 22 25  GLUCOSE 146* 103*  BUN 16 19  CREATININE 0.75 0.88  CALCIUM 6.7* 8.6*  MG 1.6*  2.2   Liver Function Tests: Recent Labs    01/06/21 2041  AST 23  ALT 21  ALKPHOS 23*  BILITOT 1.1  PROT 5.4*  ALBUMIN 3.5   No results for input(s): LIPASE, AMYLASE in the last 72 hours. CBC: Recent Labs    01/06/21 2041 01/07/21 0520 01/07/21 1019 01/07/21 2313 01/09/21 0627  WBC 17.2* 13.9*  --   --  14.0*  NEUTROABS 15.6*  --   --   --  12.3*  HGB 9.1* 7.1*   < > 12.3 12.9  HCT 27.5* 21.6*   < > 35.9* 37.4  MCV 100.7* 102.4*  --   --  94.9  PLT 139* 103*  --   --  107*   < > = values in this interval not displayed.   Cardiac Enzymes: No results for input(s): CKTOTAL, CKMB, CKMBINDEX, TROPONINI in the last 72  hours. BNP: Invalid input(s): POCBNP D-Dimer: No results for input(s): DDIMER in the last 72 hours. Hemoglobin A1C: No results for input(s): HGBA1C in the last 72 hours. Fasting Lipid Panel: Recent Labs    01/07/21 0520  CHOL 159  HDL 66  LDLCALC 84  TRIG 44  CHOLHDL 2.4   Thyroid Function Tests: Recent Labs    01/07/21 0520  TSH 1.344   Anemia Panel: Recent Labs    01/07/21 0520  VITAMINB12 606  FERRITIN 141  TIBC 182*  IRON 15*    No results found.   Echo: Reveals normal LV systolic function with estimated EF of 50-55%.  TELEMETRY: Atrial flutter with atrial fibrillation and a HR   ASSESSMENT AND PLAN:  Principal Problem:   Fall at home Active Problems:   Atrial fibrillation with RVR (HCC)   Left humeral fracture   Tibial plateau fracture, right   Acute blood loss anemia    1. Atrial flutter/ atrial fibrillation with RVR: the patient converted from Atrial fib back to NSR on yesterday and the diltiazem gtt was discontinued due to the loss of peripheral IV access; however overnight the patient went into atrial flutter/atrial fibrillation with RVR, she appears to be hemodynamically stable at this time.              -Echocardiogram results reveal a low normal LV systolic function with estimated EF of 50-55%.             -Continuous telemetry monitoring.             -Recommending holding Eliquis therapy at this time in the presence of severe anemia.             -We will start metoprolol tartrate 12.5mg  twice daily for rate control with a IV metoprolol 2.5mg  dose to be given as needed for a HR over 110 bpm.  -If rate continues to be uncontrolled, recommending restarting Cardizem gtt or amiodarone therapy.              -Recommend D5 1/5 NS infusion at 197mL/hr continuously (to be given after blood transfusion) in the presence of decreased oral intake.              -Monitor strict I&O's.   2. Anemia, reasonably stable, H&H is stable today at  12.3/35.9>>12.9/37.4             -Recommend holding Eliquis therapy at this time in the presence of anemia.             -Recommend trending H&H.  3. Hypercholesteremia, reasonably controlled, lipid profile reveals a  total cholesterol = 159, HDL = 66, LDL = 84             -Recommend heart healthy diet.             -Statin therapy deferred at this time in the presence of severe dementia.  4. SIRS, fairly stable             -Agree with current management.              -Urine cultures pending.  -Blood cultures reveals no grown in two days.   5. Mechanical fall with subsequent right medial tibial plateau fracture and left humeral neck fracture             -Agree with management per orthopedics.  6. Mixed Alzheimer's and vascular dementia, chronic with worsening             -Agree with current management.  hypoglycemia that could be contributing to worsening confusion.  7. Malnutrition             -Recommend swallow evaluation once patient is stabilized and nutrition consult   [place], [id number]-AG  01/09/2021 2:07 PM

## 2021-01-09 NOTE — Care Management Important Message (Signed)
Important Message  Patient Details  Name: Haley Kim MRN: [id number] Date of Birth: Nov 06, 1946   Medicare Important Message Given:  Yes     Dannette Barbara 01/09/2021, 2:04 PM

## 2021-01-09 NOTE — Progress Notes (Signed)
Subjective: Patient still confused but appears to be more alert and cooperative today, and in less pain. She still notes moderate pain in her left shoulder with certain motions, but notes that her left wrist already is feeling much improved, as has her right knee. She has no new complaints.   Objective: Vital signs in last 24 hours: Temp:  [97.4 F (36.3 C)-98.7 F (37.1 C)] 98.7 F (37.1 C) (02/10 1600) Pulse Rate:  [64-110] 96 (02/10 1600) Resp:  [16-20] 16 (02/10 0810) BP: (104-133)/(67-84) 107/67 (02/10 1600) SpO2:  [92 %-95 %] 93 % (02/10 1600) Weight:  [35.8 kg] 35.8 kg (02/10 0556)  Intake/Output from previous day: No intake/output data recorded. Intake/Output this shift: No intake/output data recorded.  Recent Labs    01/06/21 2041 01/07/21 0520 01/07/21 1019 01/07/21 2313 01/09/21 0627  HGB 9.1* 7.1* 6.0* 12.3 12.9   Recent Labs    01/07/21 0520 01/07/21 1019 01/07/21 2313 01/09/21 0627  WBC 13.9*  --   --  14.0*  RBC 2.11*  --   --  3.94  HCT 21.6*   < > 35.9* 37.4  PLT 103*  --   --  107*   < > = values in this interval not displayed.   Recent Labs    01/07/21 0520 01/09/21 0627  NA 144 141  K 3.8 4.7  CL 116* 109  CO2 22 25  BUN 16 19  CREATININE 0.75 0.88  GLUCOSE 146* 103*  CALCIUM 6.7* 8.6*   Recent Labs    01/06/21 2041  INR 1.2    Physical Exam: Left shoulder examination is essentially unchanged as compared to 2 days ago. She still has moderate swelling over the anterolateral aspect of the shoulder with ecchymosis developing into the upper arm. There is mild to moderate tenderness to palpation over the anterior lateral aspects of the shoulder. She has more severe pain with any active or passive motion of the shoulder.  Left wrist examination demonstrates that much of the swelling noted 2 days ago has resolved already. She still has a small amount of swelling as well as some resolving ecchymosis around the wrist. She has mild tenderness  to palpation over the wrist, but is able to flex and extend her wrist with only mild discomfort. She is neurovascularly intact to her left hand.  Right knee examination demonstrates a small residual effusion in the knee, as well as minimal swelling around the knee. No erythema, ecchymosis abrasions, or other skin abnormalities are identified. She has mild tenderness palpation over the anteromedial aspect of the proximal tibia. She is able to perform a straight leg raise and tolerate limited flexion from 0 to 45 degrees with only mild discomfort. She is neurovascularly intact to the right lower extremity and foot.  Assessment: 1.  Minimally displaced 2 part surgical neck fracture of left proximal humerus. 2.  Impacted left distal radius fracture. 3.  Minimally impacted right medial tibial plateau fracture.  Plan: The treatment options for each problem again were discussed with the patient and her husband who is at the bedside. Again, each of these fractures will be managed nonsurgically. Given how quickly her left wrist is responding clinically, I question whether she may have simply sprained her wrist during the fall and the bony changes noted on the x-rays are a sequela of a malunion resulting from her prior injury. Regardless, she is fitted with a Velcro wrist immobilizer which she will wear regularly for the next 2 weeks or until she  has been reevaluated in our office.  Regarding her left shoulder symptoms, the patient will remain in her shoulder immobilizer. The immobilizer is to be removed only for bathing purposes. She is to avoid any weightbearing through the left upper extremity.  Finally, regarding her left medial tibial plateau fracture, the patient is placed into a new hinged postoperative range of motion brace. The hinges are set at 0 to 90 degrees but locked in extension at this time. She is to leave the brace on at all times, removing it only for bathing purposes. She is to avoid  weightbearing on the right lower extremity at this time.   Marshall Cork Lacora Folmer 01/09/2021, 4:26 PM

## 2021-01-10 LAB — CBC
HCT: 34.7 % — ABNORMAL LOW (ref 36.0–46.0)
Hemoglobin: 12 g/dL (ref 12.0–15.0)
MCH: 32.9 pg (ref 26.0–34.0)
MCHC: 34.6 g/dL (ref 30.0–36.0)
MCV: 95.1 fL (ref 80.0–100.0)
Platelets: 122 10*3/uL — ABNORMAL LOW (ref 150–400)
RBC: 3.65 MIL/uL — ABNORMAL LOW (ref 3.87–5.11)
RDW: 15.7 % — ABNORMAL HIGH (ref 11.5–15.5)
WBC: 11 10*3/uL — ABNORMAL HIGH (ref 4.0–10.5)
nRBC: 0 % (ref 0.0–0.2)

## 2021-01-10 LAB — COMPREHENSIVE METABOLIC PANEL
ALT: 22 U/L (ref 0–44)
AST: 34 U/L (ref 15–41)
Albumin: 3.1 g/dL — ABNORMAL LOW (ref 3.5–5.0)
Alkaline Phosphatase: 30 U/L — ABNORMAL LOW (ref 38–126)
Anion gap: 8 (ref 5–15)
BUN: 20 mg/dL (ref 8–23)
CO2: 26 mmol/L (ref 22–32)
Calcium: 8.7 mg/dL — ABNORMAL LOW (ref 8.9–10.3)
Chloride: 107 mmol/L (ref 98–111)
Creatinine, Ser: 0.83 mg/dL (ref 0.44–1.00)
GFR, Estimated: >60 mL/min (ref >60)
Glucose, Bld: 125 mg/dL — ABNORMAL HIGH (ref 70–99)
Potassium: 4.5 mmol/L (ref 3.5–5.1)
Sodium: 141 mmol/L (ref 135–145)
Total Bilirubin: 2 mg/dL — ABNORMAL HIGH (ref 0.3–1.2)
Total Protein: 5.6 g/dL — ABNORMAL LOW (ref 6.5–8.1)

## 2021-01-10 NOTE — Progress Notes (Signed)
Progress Note    Haley Kim  EXB:[id number] DOB: [birth year]  DOA: 01/06/2021 PCP: Adin Hector, MD      Brief Narrative:    Medical records reviewed and are as summarized below:  Haley Kim is a 75 y.o. female COPD, dyslipidemia, dementia, who presented to the hospital after a fall at home.  Reportedly, there was no loss of consciousness.  In the ED, she was tachycardic and she was found to have atrial fibrillation with RVR.  Work-up revealed left humeral neck fracture, right medial tibial plateau fracture complicated by moderate-sized lipohemarthrosis, fracture of the distal left radius and minimally displaced left ulnar styloid fracture. She was treated with IV Cardizem infusion for rapid A. fib.  She developed acute blood loss anemia requiring 2 units of packed red blood cells.  She was also treated with IV fluids and analgesics.  She was evaluated by the orthopedic surgeon who recommended conservative management. TRH admitted.  Patient continues to have poor ambulation status in the setting of multiple fractures, ultimate disposition SNF at this point awaiting acceptance and insurance approval.  She continues to have A. fib with RVR rebounding, cardiology following, appreciate insight and recommendations.  Appears to be somewhat difficult to control.  Once heart rate is better controlled and patient is able to better participate with therapy will likely discharge to SNF in the next few days again pending clinical course and heart rate.  Assessment/Plan:   Principal Problem:   Fall at home Active Problems:   Atrial fibrillation with RVR (HCC)   Left humeral fracture   Tibial plateau fracture, right   Acute blood loss anemia  S/p fall, left humeral neck fracture, right medial tibial plateau fracture, moderate sized lipohemarthrosis, distal left radius fracture, minimally displaced left ulnar styloid fracture: Conservative management recommended. Per ortho "Avoid  any weightbearing through the left upper extremity or shoulder". Continue analgesics as needed for pain.  Follow-up in orthopedic surgeon. PT and OT currently recommending SNF.    Atrial fibrillation with RVR: She is off of IV Cardizem infusion this am - plan to restart per cardiology. Resume Eliquis; right knee lipohemarthrosis appears stable and anemia is resolved  Acute blood loss anemia: Hemoglobin improved s/p 2 units of PRBCs on 01/07/2021. R knee lipohemarthrosis and somewhat dilutional. Rebounded nearly 7 points with just 2 units at admission.   Hypotension: Discontinue IV fluids to avoid fluid overload.  Monitor BP off of IV  fluids.  Body mass index is 13.14 kg/m.  (Underweight): This profoundly complicates overall prognosis.  Consult dietitian.  SIRS: In the setting of trauma. No evidence of infection at this time; does not meet sepsis criteria.  Antibiotics have been discontinued.  Hypomagnesemia, resolved  Dementia: Continue Aricept  Consultants:  Cardiologist  Orthopedic surgeon  Procedures:  None planned  Medications:   . sodium chloride   Intravenous Once  . sodium chloride   Intravenous Once  . acidophilus   Oral Daily  . apixaban  2.5 mg Oral BID  . vitamin C  1,000 mg Oral Daily  . calcium carbonate  1,250 mg Oral Q breakfast  . cholecalciferol  2,000 Units Oral Daily  . donepezil  5 mg Oral QHS  . dronabinol  2.5 mg Oral QAC lunch  . escitalopram  5 mg Oral Daily  . magnesium oxide  400 mg Oral Daily  . metoprolol tartrate  12.5 mg Oral BID  . multivitamin with minerals  1 tablet Oral Daily  .  vitamin E  400 Units Oral Daily   Continuous Infusions:    Anti-infectives (From admission, onward)   Start     Dose/Rate Route Frequency Ordered Stop   01/08/21 1000  vancomycin (VANCOREADY) IVPB 500 mg/100 mL  Status:  Discontinued        500 mg 100 mL/hr over 60 Minutes Intravenous Every 36 hours 01/07/21 1217 01/07/21 1410   01/07/21 2100  ceFEPIme  (MAXIPIME) 2 g in sodium chloride 0.9 % 100 mL IVPB  Status:  Discontinued        2 g 200 mL/hr over 30 Minutes Intravenous Every 24 hours 01/06/21 2041 01/07/21 1410   01/07/21 2100  vancomycin (VANCOREADY) IVPB 500 mg/100 mL  Status:  Discontinued        500 mg 100 mL/hr over 60 Minutes Intravenous Every 48 hours 01/06/21 2041 01/07/21 1217   01/06/21 2130  ceFEPIme (MAXIPIME) 2 g in sodium chloride 0.9 % 100 mL IVPB  Status:  Discontinued        2 g 200 mL/hr over 30 Minutes Intravenous  Once 01/06/21 2129 01/06/21 2148   01/06/21 2130  metroNIDAZOLE (FLAGYL) IVPB 500 mg  Status:  Discontinued        500 mg 100 mL/hr over 60 Minutes Intravenous Every 8 hours 01/06/21 2129 01/06/21 2148   01/06/21 2130  vancomycin (VANCOCIN) IVPB 1000 mg/200 mL premix  Status:  Discontinued        1,000 mg 200 mL/hr over 60 Minutes Intravenous  Once 01/06/21 2129 01/06/21 2149   01/06/21 2030  ceFEPIme (MAXIPIME) 2 g in sodium chloride 0.9 % 100 mL IVPB        2 g 200 mL/hr over 30 Minutes Intravenous  Once 01/06/21 2017 01/06/21 2250   01/06/21 2030  metroNIDAZOLE (FLAGYL) IVPB 500 mg  Status:  Discontinued        500 mg 100 mL/hr over 60 Minutes Intravenous Every 8 hours 01/06/21 2017 01/07/21 1410   01/06/21 2030  vancomycin (VANCOREADY) IVPB 750 mg/150 mL        750 mg 150 mL/hr over 60 Minutes Intravenous  Once 01/06/21 2017 01/07/21 0029      Family Communication/Anticipated D/C date and plan/Code Status   DVT prophylaxis: apixaban (ELIQUIS) tablet 2.5 mg Start: 01/09/21 1400 Place and maintain sequential compression device Start: 01/08/21 1004 SCDs Start: 01/06/21 2125     Code Status: Full Code  Family Communication: Lengthy discussion with husband about disposition, prognosis and lengthy recovery in the setting of multiple fractures advanced age and poor nutrition.  Disposition Plan:  Status is: Inpatient  Dispo: The patient is from: Home              Anticipated d/c is to: SNF               Anticipated d/c date is: 24-48h pending placement/approval              Patient currently is medically stable to d/c.   Difficult to place patient No  Subjective:   No acute issues or events overnight, remains in A. fib with RVR this morning off drip but otherwise denies chest pain shortness of breath nausea vomiting diarrhea constipation headache fevers or chills.  Objective:    Vitals:   01/09/21 2100 01/10/21 0100 01/10/21 0324 01/10/21 0741  BP: 103/84  137/84 99/65  Pulse: 100  (!) 101 81  Resp: 18  20   Temp: 98.3 F (36.8 C)  97.9 F (36.6 C)  97.6 F (36.4 C)  TempSrc: Oral  Oral Oral  SpO2: 96%  94% 93%  Weight:  37.2 kg    Height:       No data found.   Intake/Output Summary (Last 24 hours) at 01/10/2021 0805 Last data filed at 01/09/2021 1815 Gross per 24 hour  Intake 480 ml  Output -  Net 480 ml   Filed Weights   01/08/21 0333 01/09/21 0556 01/10/21 0100  Weight: 35.4 kg 35.8 kg 37.2 kg    Exam:  GEN: NAD SKIN: Warm and dry EYES: No pallor or icterus ENT: MMM CV: Regular rate and rhythm PULM: CTA B ABD: soft, ND, NT, +BS CNS: Without overt deficit EXT: Left arm in a sling.  Brace on right knee.   Data Reviewed:   I have personally reviewed following labs and imaging studies:  Labs: Labs show the following:   Basic Metabolic Panel: Recent Labs  Lab 01/06/21 1548 01/06/21 2041 01/07/21 0520 01/09/21 0627 01/10/21 0422  NA 143 144 144 141 141  K 4.0 3.7 3.8 4.7 4.5  CL 105 110 116* 109 107  CO2 25 26 22 25 26   GLUCOSE 171* 151* 146* 103* 125*  BUN 15 16 16 19 20   CREATININE 1.17* 1.08* 0.75 0.88 0.83  CALCIUM 9.0 8.1* 6.7* 8.6* 8.7*  MG  --   --  1.6* 2.2  --    GFR Estimated Creatinine Clearance: 33.3 mL/min (by C-G formula based on SCr of 0.83 mg/dL). Liver Function Tests: Recent Labs  Lab 01/06/21 2041 01/10/21 0422  AST 23 34  ALT 21 22  ALKPHOS 23* 30*  BILITOT 1.1 2.0*  PROT 5.4* 5.6*  ALBUMIN 3.5 3.1*    No results for input(s): LIPASE, AMYLASE in the last 168 hours. No results for input(s): AMMONIA in the last 168 hours. Coagulation profile Recent Labs  Lab 01/06/21 2041  INR 1.2    CBC: Recent Labs  Lab 01/06/21 1548 01/06/21 2041 01/07/21 0520 01/07/21 1019 01/07/21 2313 01/09/21 0627 01/10/21 0422  WBC 14.9* 17.2* 13.9*  --   --  14.0* 11.0*  NEUTROABS  --  15.6*  --   --   --  12.3*  --   HGB 12.0 9.1* 7.1* 6.0* 12.3 12.9 12.0  HCT 37.0 27.5* 21.6* 18.8* 35.9* 37.4 34.7*  MCV 101.1* 100.7* 102.4*  --   --  94.9 95.1  PLT 175 139* 103*  --   --  107* 122*   Cardiac Enzymes: No results for input(s): CKTOTAL, CKMB, CKMBINDEX, TROPONINI in the last 168 hours. BNP (last 3 results) No results for input(s): PROBNP in the last 8760 hours. CBG: No results for input(s): GLUCAP in the last 168 hours. D-Dimer: No results for input(s): DDIMER in the last 72 hours. Hgb A1c: No results for input(s): HGBA1C in the last 72 hours. Lipid Profile: No results for input(s): CHOL, HDL, LDLCALC, TRIG, CHOLHDL, LDLDIRECT in the last 72 hours. Thyroid function studies: No results for input(s): TSH, T4TOTAL, T3FREE, THYROIDAB in the last 72 hours.  Invalid input(s): FREET3 Anemia work up: No results for input(s): VITAMINB12, FOLATE, FERRITIN, TIBC, IRON, RETICCTPCT in the last 72 hours. Sepsis Labs: Recent Labs  Lab 01/06/21 2041 01/07/21 0520 01/09/21 0627 01/10/21 0422  PROCALCITON 0.29  --   --   --   WBC 17.2* 13.9* 14.0* 11.0*  LATICACIDVEN 2.3* 1.8  --   --     Microbiology Recent Results (from the past 240 hour(s))  SARS  Coronavirus 2 by RT PCR (hospital order, performed in [facility] hospital lab) Nasopharyngeal Nasopharyngeal Swab     Status: None   Collection Time: 01/06/21  8:41 PM   Specimen: Nasopharyngeal Swab  Result Value Ref Range Status   SARS Coronavirus 2 NEGATIVE NEGATIVE Final    Comment: (NOTE) SARS-CoV-2 target nucleic acids are NOT  DETECTED.  The SARS-CoV-2 RNA is generally detectable in upper and lower respiratory specimens during the acute phase of infection. The lowest concentration of SARS-CoV-2 viral copies this assay can detect is 250 copies / mL. A negative result does not preclude SARS-CoV-2 infection and should not be used as the sole basis for treatment or other patient management decisions.  A negative result may occur with improper specimen collection / handling, submission of specimen other than nasopharyngeal swab, presence of viral mutation(s) within the areas targeted by this assay, and inadequate number of viral copies (<250 copies / mL). A negative result must be combined with clinical observations, patient history, and epidemiological information.  Fact Sheet for Patients:   StrictlyIdeas.no  Fact Sheet for Healthcare Providers: BankingDealers.co.za  This test is not yet approved or  cleared by the [place] FDA and has been authorized for detection and/or diagnosis of SARS-CoV-2 by FDA under an Emergency Use Authorization (EUA).  This EUA will remain in effect (meaning this test can be used) for the duration of the COVID-19 declaration under Section 564(b)(1) of the Act, 21 U.S.C. section 360bbb-3(b)(1), unless the authorization is terminated or revoked sooner.  Performed at [facility], [address] [place], [place] [address]   Culture, blood (x 2)     Status: None (Preliminary result)   Collection Time: 01/06/21  8:41 PM   Specimen: BLOOD  Result Value Ref Range Status   Specimen Description BLOOD LEFT ANTECUBITAL  Final   Special Requests   Final    BOTTLES DRAWN AEROBIC AND ANAEROBIC Blood Culture adequate volume   Culture   Final    NO GROWTH 3 DAYS Performed at [facility], [address], [place], [place] [address]    Report Status PENDING  Incomplete  Culture, blood (x 2)     Status: None  (Preliminary result)   Collection Time: 01/06/21  8:41 PM   Specimen: BLOOD  Result Value Ref Range Status   Specimen Description BLOOD RIGHT ANTECUBITAL  Final   Special Requests   Final    BOTTLES DRAWN AEROBIC AND ANAEROBIC Blood Culture adequate volume   Culture   Final    NO GROWTH 3 DAYS Performed at [facility], [address] [place], [place] [address]    Report Status PENDING  Incomplete    Procedures and diagnostic studies:  No results found.   LOS: 4 days   [facility]   Pager via secure chat  If 7PM-7AM, please contact night-coverage at [email or web address]  01/10/2021, 8:05 AM

## 2021-01-10 NOTE — Progress Notes (Signed)
OT Cancellation Note  Patient Details Name: CERINITY ZYNDA MRN: [id number] DOB: 1946-06-22   Cancelled Treatment:    Reason Eval/Treat Not Completed: Medical issues which prohibited therapy. Per RN, pt is currently agitated and getting haldol. OT to hold therapeutic intervention at this time. OT to re-attempt when pt is able to actively participate in OT intervention.   Darleen Crocker, [place], OTR/L , CBIS ascom [phone]  01/10/21, 3:02 PM   01/10/2021, 3:02 PM

## 2021-01-10 NOTE — Progress Notes (Signed)
Subjective: The patient appears to be quite comfortable this morning and has no new complaints.  She appears to be alert and interactive, although she does remain confused.   Objective: Vital signs in last 24 hours: Temp:  [97.4 F (36.3 C)-98.7 F (37.1 C)] 97.6 F (36.4 C) (02/11 0741) Pulse Rate:  [64-110] 81 (02/11 0741) Resp:  [16-20] 20 (02/11 0324) BP: (99-137)/(65-84) 99/65 (02/11 0741) SpO2:  [93 %-96 %] 93 % (02/11 0741) Weight:  [37.2 kg] 37.2 kg (02/11 0100)  Intake/Output from previous day: 02/10 0701 - 02/11 0700 In: 480 [P.O.:480] Out: -  Intake/Output this shift: No intake/output data recorded.  Recent Labs    01/07/21 1019 01/07/21 2313 01/09/21 0627 01/10/21 0422  HGB 6.0* 12.3 12.9 12.0   Recent Labs    01/09/21 0627 01/10/21 0422  WBC 14.0* 11.0*  RBC 3.94 3.65*  HCT 37.4 34.7*  PLT 107* 122*   Recent Labs    01/09/21 0627 01/10/21 0422  NA 141 141  K 4.7 4.5  CL 109 107  CO2 25 26  BUN 19 20  CREATININE 0.88 0.83  GLUCOSE 103* 125*  CALCIUM 8.6* 8.7*   No results for input(s): LABPT, INR in the last 72 hours.  Physical Exam: Orthopedic examination as it pertains to her left upper extremity and right lower extremity is unchanged as compared to yesterday.  Assessment: 1. Minimally displaced 2 part surgical neck fracture of leftproximal humerus. 2. Impacted left distal radius fracture. 3. Minimally impacted right medial tibial plateau fracture.  Plan: The plan for continued nonsurgical management of her 3 fractures remains unchanged.  She may be mobilized with physical therapy, limiting herself to standing and pivoting on her left leg for transfers.  She most likely will require rehab placement until her fractures have healed sufficiently so that she may resume full weightbearing to her right lower extremity.  I will sign off at this time.  Please arrange for the patient to follow-up with either me or my physician assistant, Cameron Proud, PA-C, in 3 to 4 weeks for repeat x-rays of her left shoulder, left wrist, and right knee.  If this patient requires further orthopedic needs during her hospital stay, please reconsult me.   Marshall Cork Samyiah Halvorsen 01/10/2021, 8:01 AM

## 2021-01-10 NOTE — NC FL2 (Signed)
[place] LEVEL OF CARE SCREENING TOOL     IDENTIFICATION  Patient Name: Haley Kim Birthdate: 07/07/1946 Sex: female Admission Date (Current Location): 01/06/2021  [place] and [place] Number:  Engineering geologist and Address:  [facility], [address] [place], [place] [address]      Provider Number: [id number]  Attending Physician Name and Address:  Little Ishikawa, MD  Relative Name and Phone Number:  Husband Jannine Abreu [phone]    Current Level of Care: Hospital Recommended Level of Care: [place] Prior Approval Number:    Date Approved/Denied:   PASRR Number: [id number]  Discharge Plan: SNF    Current Diagnoses: Patient Active Problem List   Diagnosis Date Noted  . Fall at home 01/07/2021  . Left humeral fracture 01/07/2021  . Tibial plateau fracture, right 01/07/2021  . Acute blood loss anemia 01/07/2021  . Atrial fibrillation with RVR ([place]) 01/06/2021  . Malnutrition ([place]) 07/20/2016  . Depression 05/14/2016  . Mild cognitive impairment 04/01/2016  . Medicare annual wellness visit, subsequent 01/22/2016  . Pulmonary nodule, right middle lobe, 2mm, cavitary - Nov 2013 (done for chronic cough) 10/20/2012  . Memory loss 08/16/2012  . COPD (chronic obstructive pulmonary disease) ([place]) 08/16/2012  . Osteopenia 11/19/2011  . Carotid stenosis, bilateral 04/09/2011  . 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 HYPERCHOLESTEROLEMIA 01/18/2008  . GILBERT'S SYNDROME 01/18/2008  . WEIGHT LOSS 05/10/2007    Orientation RESPIRATION BLADDER Height & Weight     Self  Normal   Weight: 37.2 kg Height:  5' 6.25" (168.3 cm)  BEHAVIORAL SYMPTOMS/MOOD NEUROLOGICAL BOWEL NUTRITION STATUS        Diet  AMBULATORY STATUS COMMUNICATION OF NEEDS Skin   Limited Assist Verbally Skin abrasions                       Personal Care Assistance Level of Assistance   Bathing,Feeding,Dressing Bathing Assistance: Limited assistance Feeding assistance: Limited assistance Dressing Assistance: Limited assistance     Functional Limitations Info  Sight,Hearing,Speech Sight Info: Adequate Hearing Info: Adequate Speech Info: Adequate    SPECIAL CARE FACTORS FREQUENCY  PT (By licensed PT),OT (By licensed OT)     PT Frequency: 5X WEEK OT Frequency: 5X WEEK            Contractures Contractures Info: Present    Additional Factors Info  Code Status,Allergies Code Status Info: Full Allergies Info: Donepezil, Dronabinol, Mirtazapine           Current Medications (01/10/2021):  This is the current hospital active medication list Current Facility-Administered Medications  Medication Dose Route Frequency Provider Last Rate Last Admin  . 0.9 %  sodium chloride infusion (Manually program via Guardrails IV Fluids)   Intravenous Once Jennye Boroughs, MD      . 0.9 %  sodium chloride infusion (Manually program via Guardrails IV Fluids)   Intravenous Once Jennye Boroughs, MD      . acetaminophen (TYLENOL) tablet 650 mg  650 mg Oral Q4H PRN Mansy, Jan A, MD   650 mg at 01/10/21 1349  . acidophilus (RISAQUAD) capsule   Oral Daily Mansy, Jan A, MD   1 capsule at 01/10/21 0946  . ALPRAZolam Duanne Moron) tablet 0.25 mg  0.25 mg Oral BID PRN Mansy, Jan A, MD   0.25 mg at 01/07/21 1335  . apixaban (ELIQUIS) tablet 2.5 mg  2.5 mg Oral BID Little Ishikawa, MD   2.5 mg at 01/10/21  9233  . ascorbic acid (VITAMIN C) tablet 1,000 mg  1,000 mg Oral Daily Mansy, Jan A, MD   1,000 mg at 01/10/21 0946  . calcium carbonate (OS-CAL - dosed in mg of elemental calcium) tablet 1,250 mg  1,250 mg Oral Q breakfast Mansy, Jan A, MD   1,250 mg at 01/10/21 0946  . cholecalciferol (VITAMIN D) tablet 2,000 Units  2,000 Units Oral Daily Mansy, Arvella Merles, MD   2,000 Units at 01/10/21 0946  . donepezil (ARICEPT) tablet 5 mg  5 mg Oral QHS Mansy, Jan A, MD   5 mg at 01/09/21 2236  . dronabinol  (MARINOL) capsule 2.5 mg  2.5 mg Oral QAC lunch Mansy, Jan A, MD   2.5 mg at 01/10/21 1234  . escitalopram (LEXAPRO) tablet 5 mg  5 mg Oral Daily Mansy, Jan A, MD   5 mg at 01/10/21 0946  . magnesium oxide (MAG-OX) tablet 400 mg  400 mg Oral Daily Mansy, Jan A, MD   400 mg at 01/10/21 0946  . metoprolol tartrate (LOPRESSOR) injection 2.5 mg  2.5 mg Intravenous A0T PRN White, Delicia, NP   2.5 mg at 01/09/21 1341  . metoprolol tartrate (LOPRESSOR) tablet 12.5 mg  12.5 mg Oral BID White, Delicia, NP   62.2 mg at 01/10/21 0946  . morphine 2 MG/ML injection 2 mg  2 mg Intravenous Q4H PRN Jennye Boroughs, MD   2 mg at 01/09/21 1211  . multivitamin with minerals tablet 1 tablet  1 tablet Oral Daily Mansy, Jan A, MD   1 tablet at 01/10/21 0946  . ondansetron (ZOFRAN) injection 4 mg  4 mg Intravenous Q6H PRN Mansy, Jan A, MD      . oxyCODONE (Oxy IR/ROXICODONE) immediate release tablet 5 mg  5 mg Oral Q6H PRN Jennye Boroughs, MD   5 mg at 01/10/21 1507  . vitamin E capsule 400 Units  400 Units Oral Daily Mansy, Jan A, MD   400 Units at 01/10/21 0946  . zolpidem (AMBIEN) tablet 5 mg  5 mg Oral QHS PRN Mansy, Jan A, MD   5 mg at 01/08/21 2026     Discharge Medications: Please see discharge summary for a list of discharge medications.  Relevant Imaging Results:  Relevant Lab Results:   Additional Information Fracture of Left Shoulde, Lt wrist and Rt Knee  Kerin Salen, RN

## 2021-01-10 NOTE — Progress Notes (Signed)
PT Cancellation Note  Patient Details Name: Haley Kim MRN: [id number] DOB: [birth year]   Cancelled Treatment:    Reason Eval/Treat Not Completed: Medical issues which prohibited therapy (Chart reviewed, treatment attempted. Pt awake in bed, smiling, no pain or other symptoms. HR in 130s-140s. Pt denies CP, SOB, palpitations. RN made aware. Deferring physical therapy at this time.)  10:16 AM, 01/10/21 Etta Grandchild, PT, DPT Physical Therapist - [facility]  [phone] ([place])    Lorijean Husser C 01/10/2021, 10:16 AM

## 2021-01-11 LAB — COMPREHENSIVE METABOLIC PANEL
ALT: 21 U/L (ref 0–44)
AST: 28 U/L (ref 15–41)
Albumin: 2.9 g/dL — ABNORMAL LOW (ref 3.5–5.0)
Alkaline Phosphatase: 31 U/L — ABNORMAL LOW (ref 38–126)
Anion gap: 6 (ref 5–15)
BUN: 19 mg/dL (ref 8–23)
CO2: 29 mmol/L (ref 22–32)
Calcium: 8.5 mg/dL — ABNORMAL LOW (ref 8.9–10.3)
Chloride: 103 mmol/L (ref 98–111)
Creatinine, Ser: 0.8 mg/dL (ref 0.44–1.00)
GFR, Estimated: >60 mL/min (ref >60)
Glucose, Bld: 128 mg/dL — ABNORMAL HIGH (ref 70–99)
Potassium: 5 mmol/L (ref 3.5–5.1)
Sodium: 138 mmol/L (ref 135–145)
Total Bilirubin: 1.6 mg/dL — ABNORMAL HIGH (ref 0.3–1.2)
Total Protein: 5.2 g/dL — ABNORMAL LOW (ref 6.5–8.1)

## 2021-01-11 LAB — CBC
HCT: 33.1 % — ABNORMAL LOW (ref 36.0–46.0)
Hemoglobin: 11.1 g/dL — ABNORMAL LOW (ref 12.0–15.0)
MCH: 32.3 pg (ref 26.0–34.0)
MCHC: 33.5 g/dL (ref 30.0–36.0)
MCV: 96.2 fL (ref 80.0–100.0)
Platelets: 129 10*3/uL — ABNORMAL LOW (ref 150–400)
RBC: 3.44 MIL/uL — ABNORMAL LOW (ref 3.87–5.11)
RDW: 15.4 % (ref 11.5–15.5)
WBC: 9.4 10*3/uL (ref 4.0–10.5)
nRBC: 0 % (ref 0.0–0.2)

## 2021-01-11 NOTE — Progress Notes (Signed)
Progress Note    PAHOUA SCHREINER  NOM:[id number] DOB: 1945-12-11  DOA: 01/06/2021 PCP: Adin Hector, MD      Brief Narrative:    Medical records reviewed and are as summarized below:  Haley Kim is a 75 y.o. female COPD, dyslipidemia, dementia, who presented to the hospital after a fall at home.  Reportedly, there was no loss of consciousness.  In the ED, she was tachycardic and she was found to have atrial fibrillation with RVR.  Work-up revealed left humeral neck fracture, right medial tibial plateau fracture complicated by moderate-sized lipohemarthrosis, fracture of the distal left radius and minimally displaced left ulnar styloid fracture. She was treated with IV Cardizem infusion for rapid A. fib.  She developed acute blood loss anemia requiring 2 units of packed red blood cells.  She was also treated with IV fluids and analgesics.  She was evaluated by the orthopedic surgeon who recommended conservative management. TRH admitted.  Patient continues to have poor ambulation status in the setting of multiple fractures, ultimate disposition SNF at this point awaiting acceptance and insurance approval.  She continues to have A. fib with RVR rebounding, cardiology following, appreciate insight and recommendations.  Appears to be somewhat difficult to control.  Once heart rate is better controlled and patient is able to better participate with therapy will likely discharge to SNF in the next few days again pending clinical course and heart rate.  Assessment/Plan:   Principal Problem:   Fall at home Active Problems:   Atrial fibrillation with RVR (H[facility])   Left humeral fracture   Tibial plateau fracture, right   Acute blood loss anemia  S/p fall, left humeral neck fracture, right medial tibial plateau fracture, moderate sized lipohemarthrosis, distal left radius fracture, minimally displaced left ulnar styloid fracture: Conservative management recommended. Per ortho "Avoid  any weightbearing through the left upper extremity or shoulder". Continue analgesics as needed for pain.  Follow-up in orthopedic surgeon. PT and OT currently recommending SNF.    Atrial fibrillation with RVR:  Continues off Cardizem Cardiology to continue to adjust rate control medications, metoprolol, diltiazem, possibly amiodarone. Resume Eliquis; right knee lipohemarthrosis appears stable and anemia is stable  Acute blood loss anemia: Hemoglobin improved s/p 2 units of PRBCs on 01/07/2021. R knee lipohemarthrosis and likely somewhat dilutional. Rebounded nearly 7 points with just 2 units at admission -concern for lab error with initial profound anemia.   Hypotension: Discontinue IV fluids to avoid fluid overload.  Monitor BP off of IV  fluids.  Severe protein caloric malnutrition  Body mass index is 13.14 kg/m.  (Underweight): This profoundly complicates overall prognosis.  Consult dietitian.  SIRS: In the setting of trauma. No evidence of infection at this time; does not meet sepsis criteria.  Antibiotics have been discontinued.  Hypomagnesemia, resolved  Dementia: Continue Aricept  Consultants:  Cardiologist  Orthopedic surgeon  Procedures:  None planned  Medications:   . sodium chloride   Intravenous Once  . sodium chloride   Intravenous Once  . acidophilus   Oral Daily  . apixaban  2.5 mg Oral BID  . vitamin C  1,000 mg Oral Daily  . calcium carbonate  1,250 mg Oral Q breakfast  . cholecalciferol  2,000 Units Oral Daily  . donepezil  5 mg Oral QHS  . dronabinol  2.5 mg Oral QAC lunch  . escitalopram  5 mg Oral Daily  . magnesium oxide  400 mg Oral Daily  . metoprolol tartrate  12.5 mg Oral BID  . multivitamin with minerals  1 tablet Oral Daily  . vitamin E  400 Units Oral Daily   Continuous Infusions:    Anti-infectives (From admission, onward)   Start     Dose/Rate Route Frequency Ordered Stop   01/08/21 1000  vancomycin (VANCOREADY) IVPB 500 mg/100 mL   Status:  Discontinued        500 mg 100 mL/hr over 60 Minutes Intravenous Every 36 hours 01/07/21 1217 01/07/21 1410   01/07/21 2100  ceFEPIme (MAXIPIME) 2 g in sodium chloride 0.9 % 100 mL IVPB  Status:  Discontinued        2 g 200 mL/hr over 30 Minutes Intravenous Every 24 hours 01/06/21 2041 01/07/21 1410   01/07/21 2100  vancomycin (VANCOREADY) IVPB 500 mg/100 mL  Status:  Discontinued        500 mg 100 mL/hr over 60 Minutes Intravenous Every 48 hours 01/06/21 2041 01/07/21 1217   01/06/21 2130  ceFEPIme (MAXIPIME) 2 g in sodium chloride 0.9 % 100 mL IVPB  Status:  Discontinued        2 g 200 mL/hr over 30 Minutes Intravenous  Once 01/06/21 2129 01/06/21 2148   01/06/21 2130  metroNIDAZOLE (FLAGYL) IVPB 500 mg  Status:  Discontinued        500 mg 100 mL/hr over 60 Minutes Intravenous Every 8 hours 01/06/21 2129 01/06/21 2148   01/06/21 2130  vancomycin (VANCOCIN) IVPB 1000 mg/200 mL premix  Status:  Discontinued        1,000 mg 200 mL/hr over 60 Minutes Intravenous  Once 01/06/21 2129 01/06/21 2149   01/06/21 2030  ceFEPIme (MAXIPIME) 2 g in sodium chloride 0.9 % 100 mL IVPB        2 g 200 mL/hr over 30 Minutes Intravenous  Once 01/06/21 2017 01/06/21 2250   01/06/21 2030  metroNIDAZOLE (FLAGYL) IVPB 500 mg  Status:  Discontinued        500 mg 100 mL/hr over 60 Minutes Intravenous Every 8 hours 01/06/21 2017 01/07/21 1410   01/06/21 2030  vancomycin (VANCOREADY) IVPB 750 mg/150 mL        750 mg 150 mL/hr over 60 Minutes Intravenous  Once 01/06/21 2017 01/07/21 0029      Family Communication/Anticipated D/C date and plan/Code Status   DVT prophylaxis: apixaban (ELIQUIS) tablet 2.5 mg Start: 01/09/21 1400 Place and maintain sequential compression device Start: 01/08/21 1004 SCDs Start: 01/06/21 2125     Code Status: Full Code  Family Communication: Lengthy discussion with husband about disposition, prognosis and lengthy recovery in the setting of multiple fractures advanced  age and poor nutrition.  Disposition Plan:  Status is: Inpatient  Dispo: The patient is from: Home              Anticipated d/c is to: SNF              Anticipated d/c date is: 24-48h pending placement/approval              Patient currently is medically stable to d/c.   Difficult to place patient No  Subjective:   No acute issues or events overnight, remains in A. fib but much more rate controlled today.  Review of systems limited but patient denies any overt pain chest pain nausea vomiting or shortness of breath.  Objective:    Vitals:   01/10/21 1900 01/11/21 0300 01/11/21 0736 01/11/21 1130  BP: 133/68 119/61 123/73 91/65  Pulse: 82 98 90 (!)  109  Resp: 18 18 16 18   Temp: 98.1 F (36.7 C) 98.1 F (36.7 C) 98 F (36.7 C) 98.1 F (36.7 C)  TempSrc:      SpO2: 93% 96% 91% 95%  Weight:  35.4 kg    Height:       No data found.   Intake/Output Summary (Last 24 hours) at 01/11/2021 1403 Last data filed at 01/10/2021 2100 Gross per 24 hour  Intake 100 ml  Output -  Net 100 ml   Filed Weights   01/09/21 0556 01/10/21 0100 01/11/21 0300  Weight: 35.8 kg 37.2 kg 35.4 kg    Exam:  GEN: NAD SKIN: Warm and dry EYES: No pallor or icterus ENT: MMM CV: Regular rate and rhythm PULM: CTA B ABD: soft, ND, NT, +BS CNS: Without overt deficit EXT: Left arm in a sling.  Brace on right knee.   Data Reviewed:   I have personally reviewed following labs and imaging studies:  Labs: Labs show the following:   Basic Metabolic Panel: Recent Labs  Lab 01/06/21 2041 01/07/21 0520 01/09/21 0627 01/10/21 0422 01/11/21 0415  NA 144 144 141 141 138  K 3.7 3.8 4.7 4.5 5.0  CL 110 116* 109 107 103  CO2 26 22 25 26 29   GLUCOSE 151* 146* 103* 125* 128*  BUN 16 16 19 20 19   CREATININE 1.08* 0.75 0.88 0.83 0.80  CALCIUM 8.1* 6.7* 8.6* 8.7* 8.5*  MG  --  1.6* 2.2  --   --    GFR Estimated Creatinine Clearance: 32.9 mL/min (by C-G formula based on SCr of 0.8 mg/dL). Liver  Function Tests: Recent Labs  Lab 01/06/21 2041 01/10/21 0422 01/11/21 0415  AST 23 34 28  ALT 21 22 21   ALKPHOS 23* 30* 31*  BILITOT 1.1 2.0* 1.6*  PROT 5.4* 5.6* 5.2*  ALBUMIN 3.5 3.1* 2.9*   No results for input(s): LIPASE, AMYLASE in the last 168 hours. No results for input(s): AMMONIA in the last 168 hours. Coagulation profile Recent Labs  Lab 01/06/21 2041  INR 1.2    CBC: Recent Labs  Lab 01/06/21 2041 01/07/21 0520 01/07/21 1019 01/07/21 2313 01/09/21 0627 01/10/21 0422 01/11/21 0415  WBC 17.2* 13.9*  --   --  14.0* 11.0* 9.4  NEUTROABS 15.6*  --   --   --  12.3*  --   --   HGB 9.1* 7.1* 6.0* 12.3 12.9 12.0 11.1*  HCT 27.5* 21.6* 18.8* 35.9* 37.4 34.7* 33.1*  MCV 100.7* 102.4*  --   --  94.9 95.1 96.2  PLT 139* 103*  --   --  107* 122* 129*   Cardiac Enzymes: No results for input(s): CKTOTAL, CKMB, CKMBINDEX, TROPONINI in the last 168 hours. BNP (last 3 results) No results for input(s): PROBNP in the last 8760 hours. CBG: No results for input(s): GLUCAP in the last 168 hours. D-Dimer: No results for input(s): DDIMER in the last 72 hours. Hgb A1c: No results for input(s): HGBA1C in the last 72 hours. Lipid Profile: No results for input(s): CHOL, HDL, LDLCALC, TRIG, CHOLHDL, LDLDIRECT in the last 72 hours. Thyroid function studies: No results for input(s): TSH, T4TOTAL, T3FREE, THYROIDAB in the last 72 hours.  Invalid input(s): FREET3 Anemia work up: No results for input(s): VITAMINB12, FOLATE, FERRITIN, TIBC, IRON, RETICCTPCT in the last 72 hours. Sepsis Labs: Recent Labs  Lab 01/06/21 2041 01/07/21 0520 01/09/21 0627 01/10/21 0422 01/11/21 0415  PROCALCITON 0.29  --   --   --   --  WBC 17.2* 13.9* 14.0* 11.0* 9.4  LATICACIDVEN 2.3* 1.8  --   --   --     Microbiology Recent Results (from the past 240 hour(s))  SARS Coronavirus 2 by RT PCR (hospital order, performed in [facility] hospital lab) Nasopharyngeal Nasopharyngeal Swab      Status: None   Collection Time: 01/06/21  8:41 PM   Specimen: Nasopharyngeal Swab  Result Value Ref Range Status   SARS Coronavirus 2 NEGATIVE NEGATIVE Final    Comment: (NOTE) SARS-CoV-2 target nucleic acids are NOT DETECTED.  The SARS-CoV-2 RNA is generally detectable in upper and lower respiratory specimens during the acute phase of infection. The lowest concentration of SARS-CoV-2 viral copies this assay can detect is 250 copies / mL. A negative result does not preclude SARS-CoV-2 infection and should not be used as the sole basis for treatment or other patient management decisions.  A negative result may occur with improper specimen collection / handling, submission of specimen other than nasopharyngeal swab, presence of viral mutation(s) within the areas targeted by this assay, and inadequate number of viral copies (<250 copies / mL). A negative result must be combined with clinical observations, patient history, and epidemiological information.  Fact Sheet for Patients:   StrictlyIdeas.no  Fact Sheet for Healthcare Providers: BankingDealers.co.za  This test is not yet approved or  cleared by the [place] FDA and has been authorized for detection and/or diagnosis of SARS-CoV-2 by FDA under an Emergency Use Authorization (EUA).  This EUA will remain in effect (meaning this test can be used) for the duration of the COVID-19 declaration under Section 564(b)(1) of the Act, 21 U.S.C. section 360bbb-3(b)(1), unless the authorization is terminated or revoked sooner.  Performed at [facility], [address] [place], [place] [address]   Culture, blood (x 2)     Status: None (Preliminary result)   Collection Time: 01/06/21  8:41 PM   Specimen: BLOOD  Result Value Ref Range Status   Specimen Description BLOOD LEFT ANTECUBITAL  Final   Special Requests   Final    BOTTLES DRAWN AEROBIC AND ANAEROBIC Blood Culture  adequate volume   Culture   Final    NO GROWTH 4 DAYS Performed at [facility], [address], [place], [place] [address]    Report Status PENDING  Incomplete  Culture, blood (x 2)     Status: None (Preliminary result)   Collection Time: 01/06/21  8:41 PM   Specimen: BLOOD  Result Value Ref Range Status   Specimen Description BLOOD RIGHT ANTECUBITAL  Final   Special Requests   Final    BOTTLES DRAWN AEROBIC AND ANAEROBIC Blood Culture adequate volume   Culture   Final    NO GROWTH 4 DAYS Performed at [facility], [address], [place], [place] [address]    Report Status PENDING  Incomplete    Procedures and diagnostic studies:  No results found.   LOS: 5 days   [facility]   Pager via secure chat  If 7PM-7AM, please contact night-coverage at [email or web address]  01/11/2021, 2:03 PM

## 2021-01-11 NOTE — Progress Notes (Signed)
Report received.  Care assumed.  Family at the bedside. Pt alert and oriented x2 Able to follow commands.  Pt denies pain, wants, or needs at this time Call bell remains within reach.  Will continue to monitor 

## 2021-01-11 NOTE — Progress Notes (Signed)
[facility] Cardiology  Patient Description:Mrs. Hauge is a 75 year old female with PMH significant for mixed Alzheimer's and vascular dementia, hypercholesterolemia and COPD who was admitted to [facility] due to an unwitnessed fall resulting in right medial tibial plateau impacted fracture and left shoulder nondisplaced left humeral neck fracture. Upon ED arrival the patient's ECG revealed atrial fibrillation with RVR, thus cardiology was consulted.   SUBJECTIVE: The patient is asleep at this time. The husband is at the bedside and reports that the patient has been resting well this morning. He states that she has not complained of any pain.  The husband does express concerns over the patient receiving morphine and states that he "does not want her in pain, but I don't want her getting the morphine." He states that he does not like the way she becomes increasingly confused and drowsy while on this medication.   (01/09/21)-The patient states that she is doing okay today and she denies having any chest pain or dyspnea at this time.   (01/08/2021)-The patient reports to be doing well on today and she denies having any chest pain or dyspnea at this time.  OBJECTIVE: The patient appears to be resting well at this time and is in no apparent distress. The patient awaken briefly during the exam, but was very drowsy. The patient continues to be in atrial fibrillation/flutter with a controlled rate in the 70-80's. The patient continues to be asymptomatic.   (01/09/21)-The patient continues to be confused on today, but appears stable. The patient is now in atrial flutter with episodes of atrial fibrillation with RVR at a HR of 142 bpm at this time. The RN at the bedsides states that the patient is in pain and that her HR has been elevated since earlier this morning prior to 7am. The patient is in a low-fall risk bed with rails up, she is on the monitor and she is sitting up eating her breakfast at this time. The RN reports that  the patient has been able to swallow effectively and has also been able to take small, oral pills without any complications.   (02/04/9023)-OXB patient is remarkably confused on today and is only alert and oriented x1.  She was able to tell me that she was not having any pain or shortness of breath; however, she was unable to answer any further questions beyond that due to her confusion.  The patient is now on room air and satting at 97%.  Overnight the patient converted back to normal sinus rhythm and continues to be in normal sinus rhythm with a heart rate of 85 bpm.  Additionally, the patient removed her IVs early this morning, that she has not been on the Cardizem drip at all and has sustained a normal sinus rhythm.  The patient continues to be slightly hypotensive; however, she appears to be hemodynamically stable and all of her other vital signs are stable.  Vitals:   01/10/21 1632 01/10/21 1900 01/11/21 0300 01/11/21 0736  BP: (!) 89/53 133/68 119/61 123/73  Pulse: 88 82 98 90  Resp: 16 18 18 16   Temp: 98 F (36.7 C) 98.1 F (36.7 C) 98.1 F (36.7 C) 98 F (36.7 C)  TempSrc:      SpO2: 91% 93% 96% 91%  Weight:   35.4 kg   Height:         Intake/Output Summary (Last 24 hours) at 01/11/2021 1100 Last data filed at 01/10/2021 2100 Gross per 24 hour  Intake 100 ml  Output --  Net 100 ml      PHYSICAL EXAM  General: Well developed, malnourished, in no acute distress HEENT:  Normocephalic and atraumatic. PERRL.  Neck:   No JVD.  Lungs: Clear bilaterally to auscultation. Chest expansion symmetrical. No wheezes rales or no rhonchi noted. Heart: Irregular heart rate and rhythm. Normal S1 and S2 without gallops or murmurs.  Abdomen: Bowel sounds are positive, abdomen soft and non-tender  Msk: Diminished strength and tone for age. Left arm continues to remain in sling. Extremities: No clubbing, cyanosis or edema.   Neuro: Alert and oriented X 1; drowsy at this time.  Psych:  Good  affect, responds inappropriately   LABS: Basic Metabolic Panel: Recent Labs    01/09/21 0627 01/10/21 0422 01/11/21 0415  NA 141 141 138  K 4.7 4.5 5.0  CL 109 107 103  CO2 25 26 29   GLUCOSE 103* 125* 128*  BUN 19 20 19   CREATININE 0.88 0.83 0.80  CALCIUM 8.6* 8.7* 8.5*  MG 2.2  --   --    Liver Function Tests: Recent Labs    01/10/21 0422 01/11/21 0415  AST 34 28  ALT 22 21  ALKPHOS 30* 31*  BILITOT 2.0* 1.6*  PROT 5.6* 5.2*  ALBUMIN 3.1* 2.9*   No results for input(s): LIPASE, AMYLASE in the last 72 hours. CBC: Recent Labs    01/09/21 0627 01/10/21 0422 01/11/21 0415  WBC 14.0* 11.0* 9.4  NEUTROABS 12.3*  --   --   HGB 12.9 12.0 11.1*  HCT 37.4 34.7* 33.1*  MCV 94.9 95.1 96.2  PLT 107* 122* 129*   Cardiac Enzymes: No results for input(s): CKTOTAL, CKMB, CKMBINDEX, TROPONINI in the last 72 hours. BNP: Invalid input(s): POCBNP D-Dimer: No results for input(s): DDIMER in the last 72 hours. Hemoglobin A1C: No results for input(s): HGBA1C in the last 72 hours. Fasting Lipid Panel: No results for input(s): CHOL, HDL, LDLCALC, TRIG, CHOLHDL, LDLDIRECT in the last 72 hours. Thyroid Function Tests: No results for input(s): TSH, T4TOTAL, T3FREE, THYROIDAB in the last 72 hours.  Invalid input(s): FREET3 Anemia Panel: No results for input(s): VITAMINB12, FOLATE, FERRITIN, TIBC, IRON, RETICCTPCT in the last 72 hours.  No results found.   Echo: Reveals normal LV systolic function with estimated EF of 50-55%.  TELEMETRY: Atrial flutter with atrial fibrillation and a HR   ASSESSMENT AND PLAN:  Principal Problem:   Fall at home Active Problems:   Atrial fibrillation with RVR (HCC)   Left humeral fracture   Tibial plateau fracture, right   Acute blood loss anemia    1. Atrial flutter/ atrial fibrillation with RVR: the patient converted from Atrial fib back to NSR on yesterday and the diltiazem gtt was discontinued due to the loss of peripheral IV  access; however overnight the patient went into atrial flutter/atrial fibrillation with RVR, she appears to be hemodynamically stable at this time.              -Echocardiogram results reveal a low normal LV systolic function with estimated EF of 50-55%.             -Recommending holding Eliquis therapy at this time in the presence of severe anemia.     -Continuous telemetry monitoring.             -We will continue metoprolol tartrate 12.5mg  twice daily for rate control with a IV metoprolol 2.5mg  dose to be given as needed for a HR over 110 bpm.  -If patient continues to  be normotensive, recommend restarting oral Cardizem or amiodarone therapy in attempt to convert the patient back to NSR.              -Recommend D5 1/5 NS infusion at 175mL/hr continuously (to be given after blood transfusion) in the presence of decreased oral intake.              -Monitor strict I&O's.   2. Anemia, reasonably stable, H&H is stable today at 12.3/35.9>>12.9/37.4>>12/34.7             -Recommend holding Eliquis therapy at this time in the presence of anemia.             -Recommend trending H&H.  3. Hypercholesteremia, reasonably controlled, lipid profile reveals a total cholesterol = 159, HDL = 66, LDL = 84             -Recommend heart healthy diet.             -Statin therapy deferred at this time in the presence of severe dementia.  4. SIRS, fairly stable             -Agree with current management.  -Blood cultures reveals no growth.   5. Mechanical fall with subsequent right medial tibial plateau fracture and left humeral neck fracture             -Agree with management per orthopedics.  6. Mixed Alzheimer's and vascular dementia, chronic with worsening             -Agree with current management.  hypoglycemia that could be contributing to worsening confusion.  7. Malnutrition             -Recommend swallow evaluation once patient is stabilized and nutrition consult   Jovan Schickling, ACNPC-AG   01/11/2021 11:00 AM

## 2021-01-11 NOTE — Progress Notes (Signed)
PT Cancellation Note  Patient Details Name: Haley Kim MRN: [id number] DOB: 1946-07-16   Cancelled Treatment:    Reason Eval/Treat Not Completed: Medical issues which prohibited therapy.  Therapist chart reviewed pt and attempted to see pt.  Pt was had B LE's in between handrails of low bed upon arrival to room with bed alarm set, but not going off.  Pt was readjusted in bed, however noted to have >150 HR at rest.  Nursing notified.  Pt will be seen at later date/time when medically appropriate.     Gwenlyn Saran, PT, DPT 01/11/21, 11:33 AM

## 2021-01-11 NOTE — Progress Notes (Signed)
Patient Name: Haley Kim Date of Encounter: 01/11/2021  Hospital Problem List     Principal Problem:   Fall at home Active Problems:   Atrial fibrillation with RVR ([place])   Left humeral fracture   Tibial plateau fracture, right   Acute blood loss anemia    Patient Profile     75 year old female with PMH significant for mixed Alzheimer's and vascular dementia, hypercholesterolemia and COPD who was admitted to [facility] due to an unwitnessed fallresulting inright medial tibial plateau impacted fracture and left shoulder nondisplaced left humeral neck fracture.UponEDarrivalthe patient's ECG revealed atrial fibrillation with RVR, thus cardiology was consulted.  Subjective   Difficult historian but denies any chest discomfort. States she feels better.  Inpatient Medications    . sodium chloride   Intravenous Once  . sodium chloride   Intravenous Once  . acidophilus   Oral Daily  . apixaban  2.5 mg Oral BID  . vitamin C  1,000 mg Oral Daily  . calcium carbonate  1,250 mg Oral Q breakfast  . cholecalciferol  2,000 Units Oral Daily  . donepezil  5 mg Oral QHS  . dronabinol  2.5 mg Oral QAC lunch  . escitalopram  5 mg Oral Daily  . magnesium oxide  400 mg Oral Daily  . metoprolol tartrate  12.5 mg Oral BID  . multivitamin with minerals  1 tablet Oral Daily  . vitamin E  400 Units Oral Daily    Vital Signs    Vitals:   01/10/21 1632 01/10/21 1900 01/11/21 0300 01/11/21 0736  BP: (!) 89/53 133/68 119/61 123/73  Pulse: 88 82 98 90  Resp: 16 18 18 16   Temp: 98 F (36.7 C) 98.1 F (36.7 C) 98.1 F (36.7 C) 98 F (36.7 C)  TempSrc:      SpO2: 91% 93% 96% 91%  Weight:   35.4 kg   Height:        Intake/Output Summary (Last 24 hours) at 01/11/2021 0916 Last data filed at 01/10/2021 2100 Gross per 24 hour  Intake 340 ml  Output --  Net 340 ml   Filed Weights   01/09/21 0556 01/10/21 0100 01/11/21 0300  Weight: 35.8 kg 37.2 kg 35.4 kg    Physical Exam     GEN: Thin somewhat malnourished appearing female HEENT: normal.  Neck: Supple, no JVD, carotid bruits, or masses. Cardiac: Irregular regular rhythm.  Radials/DP/PT 2+ and equal bilaterally.  Respiratory:  Respirations regular and unlabored, clear to auscultation bilaterally. GI: Soft, nontender, nondistended, BS + x 4. MS: no deformity or atrophy. Skin: warm and dry, no rash. Neuro:  Strength and sensation are intact. Psych: Normal affect.  Labs    CBC Recent Labs    01/09/21 0627 01/10/21 0422 01/11/21 0415  WBC 14.0* 11.0* 9.4  NEUTROABS 12.3*  --   --   HGB 12.9 12.0 11.1*  HCT 37.4 34.7* 33.1*  MCV 94.9 95.1 96.2  PLT 107* 122* 496*   Basic Metabolic Panel Recent Labs    01/09/21 0627 01/10/21 0422 01/11/21 0415  NA 141 141 138  K 4.7 4.5 5.0  CL 109 107 103  CO2 25 26 29   GLUCOSE 103* 125* 128*  BUN 19 20 19   CREATININE 0.88 0.83 0.80  CALCIUM 8.6* 8.7* 8.5*  MG 2.2  --   --    Liver Function Tests Recent Labs    01/10/21 0422 01/11/21 0415  AST 34 28  ALT 22 21  ALKPHOS 30* 31*  BILITOT 2.0* 1.6*  PROT 5.6* 5.2*  ALBUMIN 3.1* 2.9*   No results for input(s): LIPASE, AMYLASE in the last 72 hours. Cardiac Enzymes No results for input(s): CKTOTAL, CKMB, CKMBINDEX, TROPONINI in the last 72 hours. BNP No results for input(s): BNP in the last 72 hours. D-Dimer No results for input(s): DDIMER in the last 72 hours. Hemoglobin A1C No results for input(s): HGBA1C in the last 72 hours. Fasting Lipid Panel No results for input(s): CHOL, HDL, LDLCALC, TRIG, CHOLHDL, LDLDIRECT in the last 72 hours. Thyroid Function Tests No results for input(s): TSH, T4TOTAL, T3FREE, THYROIDAB in the last 72 hours.  Invalid input(s): FREET3  Telemetry    Atrial fib/flutter with variable but somewhat rapid ventricular response in the 100-120 range  ECG    A. fib with RVR  Radiology    DG Pelvis 1-2 Views  Result Date: 01/06/2021 CLINICAL DATA:  Unwitnessed fall  at home. EXAM: PELVIS - 1-2 VIEW COMPARISON:  None. FINDINGS: Both hips are normally located. No definite acute hip fracture. The pubic symphysis and SI joints are intact. No definite pelvic fractures. IMPRESSION: No acute bony findings. Electronically Signed   By: Marijo Sanes M.D.   On: 01/06/2021 17:21   DG Wrist 2 Views Left  Result Date: 01/07/2021 CLINICAL DATA:  Left wrist pain after fall yesterday EXAM: LEFT WRIST - 2 VIEW COMPARISON:  None. FINDINGS: Frontal and lateral views of the left wrist are obtained. There is an impacted fracture of the distal left radius with dorsal angulation. There appears to be intra-articular extension on the frontal view. Small displaced ulnar styloid fracture also noted. The radiocarpal joint remains intact. Incidental lunotriquetral coalition, and anatomic variant. There is diffuse soft tissue edema of the left wrist and distal forearm. IMPRESSION: 1. Impacted intra-articular fracture of the distal left radius, with dorsal angulation at the radiocarpal joint. 2. Minimally displaced ulnar styloid fracture. 3. Diffuse soft tissue edema. Electronically Signed   By: Randa Ngo M.D.   On: 01/07/2021 15:02   CT Head Wo Contrast  Result Date: 01/06/2021 CLINICAL DATA:  Patient found down on floor by has bone, unknown mechanism. Patient endorses neck and back pain. EXAM: CT HEAD WITHOUT CONTRAST CT CERVICAL SPINE WITHOUT CONTRAST TECHNIQUE: Multidetector CT imaging of the head and cervical spine was performed following the standard protocol without intravenous contrast. Multiplanar CT image reconstructions of the cervical spine were also generated. COMPARISON:  None. FINDINGS: CT HEAD FINDINGS Study is slightly motion degraded. Brain: No evidence of acute infarction, hemorrhage, hydrocephalus, extra-axial collection or mass lesion/mass effect. Global parenchymal volume loss with ex vacuo dilatation of the ventricular system. Mild volume of chronic ischemic white matter  disease. 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 Vascular: No hyperdense vessel or unexpected calcification. Skull: Normal. Negative for fracture or focal lesion. Sinuses/Orbits: Paranasal sinuses are clear.  Prior lens surgery. Other: None CT CERVICAL SPINE FINDINGS Study is slightly motion degraded. Alignment: Normal. Skull base and vertebrae: No acute fracture. No primary bone lesion or focal pathologic process. Soft tissues and spinal canal: No prevertebral fluid or swelling. No visible canal hematoma. Disc levels: Multilevel degenerative changes spine with disc space narrowing, disc osteophyte complex ease, uncovertebral and facet hypertrophy, most significant in the lower cervical spine. Upper chest: Biapical scarring. Other: None IMPRESSION: Study is slightly motion degraded.  Within this context: 1.  There is no acute intracranial abnormality 2.  No visualized fracture or subluxation of the cervical spine. Electronically Signed   By:  Dahlia Bailiff MD   On: 01/06/2021 16:27   CT Cervical Spine Wo Contrast  Result Date: 01/06/2021 CLINICAL DATA:  Patient found down on floor by has bone, unknown mechanism. Patient endorses neck and back pain. EXAM: CT HEAD WITHOUT CONTRAST CT CERVICAL SPINE WITHOUT CONTRAST TECHNIQUE: Multidetector CT imaging of the head and cervical spine was performed following the standard protocol without intravenous contrast. Multiplanar CT image reconstructions of the cervical spine were also generated. COMPARISON:  None. FINDINGS: CT HEAD FINDINGS Study is slightly motion degraded. Brain: No evidence of acute infarction, hemorrhage, hydrocephalus, extra-axial collection or mass lesion/mass effect. Global parenchymal volume loss with ex vacuo dilatation of the ventricular system. Mild volume of chronic ischemic white matter disease. 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 Vascular: No hyperdense vessel or unexpected calcification. Skull: Normal. Negative for fracture  or focal lesion. Sinuses/Orbits: Paranasal sinuses are clear.  Prior lens surgery. Other: None CT CERVICAL SPINE FINDINGS Study is slightly motion degraded. Alignment: Normal. Skull base and vertebrae: No acute fracture. No primary bone lesion or focal pathologic process. Soft tissues and spinal canal: No prevertebral fluid or swelling. No visible canal hematoma. Disc levels: Multilevel degenerative changes spine with disc space narrowing, disc osteophyte complex ease, uncovertebral and facet hypertrophy, most significant in the lower cervical spine. Upper chest: Biapical scarring. Other: None IMPRESSION: Study is slightly motion degraded.  Within this context: 1.  There is no acute intracranial abnormality 2.  No visualized fracture or subluxation of the cervical spine. Electronically Signed   By: Dahlia Bailiff MD   On: 01/06/2021 16:27   CT Knee Right Wo Contrast  Result Date: 01/06/2021 CLINICAL DATA:  [place].  Abnormal x-rays. EXAM: CT OF THE right KNEE WITHOUT CONTRAST TECHNIQUE: Multidetector CT imaging of the right knee was performed according to the standard protocol. Multiplanar CT image reconstructions were also generated. COMPARISON:  Right knee radiographs, same date. FINDINGS: Nondisplaced but slightly impacted medial tibial plateau fracture. Small fracture lines along the intra-articular portion accounting for the lipohemarthrosis. The tibial spines and lateral tibial plateau are intact. No femur, fibula or patella fractures. Advanced changes of osteoporosis. Moderate-sized lipohemarthrosis is noted. Grossly by CT the cruciate and collateral ligaments are intact. The quadriceps and patellar tendons are intact. IMPRESSION: 1. Nondisplaced but slightly impacted medial tibial plateau fracture. 2. Moderate-sized lipohemarthrosis. 3. Advanced changes of osteoporosis. 4. No other fractures are identified. Electronically Signed   By: Marijo Sanes M.D.   On: 01/06/2021 19:06   DG Shoulder Left  Result  Date: 01/06/2021 CLINICAL DATA:  Found down at home.  Left shoulder pain. EXAM: LEFT SHOULDER - 2+ VIEW COMPARISON:  None. FINDINGS: Nondisplaced fracture noted through the humeral neck and probably involving the base of the greater tuberosity. The [facility] joint is intact. No clavicle fracture. The visualized left ribs are intact and the visualized left lung is grossly clear. IMPRESSION: Nondisplaced left humeral neck fracture. Electronically Signed   By: Marijo Sanes M.D.   On: 01/06/2021 17:17   DG Knee Complete 4 Views Right  Result Date: 01/06/2021 CLINICAL DATA:  Found down at home.  Right knee pain. EXAM: RIGHT KNEE - COMPLETE 4+ VIEW COMPARISON:  None FINDINGS: The joint spaces are maintained. No acute fracture is identified. However, there is a lipohemarthrosis noted on the lateral film, highly suggestive of fracture. Recommend CT for further evaluation. IMPRESSION: No definite acute fracture. However, suspect lipohemarthrosis highly suggestive of fracture. CT is recommended for further evaluation. Electronically Signed  By: Marijo Sanes M.D.   On: 01/06/2021 17:20   ECHOCARDIOGRAM COMPLETE  Result Date: 01/08/2021    ECHOCARDIOGRAM REPORT   Patient Name:   Haley Kim Date of Exam: 01/07/2021 Medical Rec #:  [id number]          Height:       66.3 in Accession #:    [id number]         Weight:       70.0 lb Date of Birth:  01-27-1946          BSA:          1.284 m Patient Age:    7 years           BP:           121/64 mmHg Patient Gender: F                  HR:           105 bpm. Exam Location:  [facility] Procedure: 2D Echo, Cardiac Doppler and Color Doppler Indications:    Atrial Fibrillation I48.91  History:        Patient has no prior history of Echocardiogram examinations.                 COPD.  Sonographer:    Sherrie Sport RDCS (AE) Referring Phys: [id number] Mary Sella A MANSY Diagnosing      Yolonda Kida MD Phys:  Sonographer Comments: Technically difficult study due to poor echo windows, no apical window  and suboptimal parasternal window. Image acquisition challenging due to patient body habitus and Image acquisition challenging due to COPD. IMPRESSIONS  1. Left ventricular ejection fraction, by estimation, is 50 to 55%. The left ventricle has low normal function. The left ventricle has no regional wall motion abnormalities. Left ventricular diastolic parameters were normal.  2. Right ventricular systolic function is normal. The right ventricular size is normal.  3. The mitral valve is normal in structure. No evidence of mitral valve regurgitation.  4. The aortic valve is normal in structure. Aortic valve regurgitation is not visualized. Conclusion(s)/Recommendation(s): Poor windows for evaluation of left ventricular function by transthoracic echocardiography. Would recommend an alternative means of evaluation. FINDINGS  Left Ventricle: Left ventricular ejection fraction, by estimation, is 50 to 55%. The left ventricle has low normal function. The left ventricle has no regional wall motion abnormalities. The left ventricular internal cavity size was normal in size. There is no left ventricular hypertrophy. Left ventricular diastolic parameters were normal. Right Ventricle: The right ventricular size is normal. No increase in right ventricular wall thickness. Right ventricular systolic function is normal. Left Atrium: Left atrial size was normal in size. Right Atrium: Right atrial size was normal in size. Pericardium: There is no evidence of pericardial effusion. Mitral Valve: The mitral valve is normal in structure. No evidence of mitral valve regurgitation. Tricuspid Valve: The tricuspid valve is not well visualized. Tricuspid valve regurgitation is trivial. Aortic Valve: The aortic valve is normal in structure. Aortic valve regurgitation is not visualized. Pulmonic Valve: The pulmonic valve was normal in structure. Pulmonic valve regurgitation is not visualized. Aorta: The aortic arch was not well visualized.  IAS/Shunts: No atrial level shunt detected by color flow Doppler.  LEFT VENTRICLE PLAX 2D LVIDd:         3.23 cm LVIDs:         2.59 cm LV PW:         0.83 cm LV IVS:  0.61 cm LVOT diam:     2.00 cm LVOT Area:     3.14 cm  LEFT ATRIUM         Index LA diam:    2.40 cm 1.87 cm/m                        PULMONIC VALVE AORTA                 PV Vmax:        0.77 m/s Ao Root diam: 2.95 cm PV Peak grad:   2.4 mmHg                       RVOT Peak grad: 2 mmHg   SHUNTS Systemic Diam: 2.00 cm Yolonda Kida MD Electronically signed by Yolonda Kida MD Signature Date/Time: 01/08/2021/8:09:16 AM    Final     Assessment & Plan     Principal Problem:   Fall at home Active Problems:   Atrial fibrillation with RVR ([place])   Left humeral fracture   Tibial plateau fracture, right   Acute blood loss anemia      Signed, Javier Docker. Gaylyn Berish MD 01/11/2021, 9:16 AM 1. Atrial flutter/ atrial fibrillation with RVR: the patient converted from Atrial fib back to NSR on diltiazem gtt however access was lost and diltiazem was discontinued. Currently still in A. fib.Will place back on p.o. short acting diltiazem to attempt to improve rate and possibly convert to sinus rhythm.  Will consider p.o. amiodarone load amiodarone if rate continues to be a problem. -Echocardiogram results reveal a low normal LV systolic function with estimated EF of 50-55%. -Recommending holding Eliquis therapy at this time in the presence of severe anemia. -We will continue metoprolol tartrate 12.5mg  twice daily for rate control                2.Anemia, reasonably stable, H&H is 11.1 today down from twelve.  Will need to closely follow.  Remain off of Eliquis   3.Hypercholesteremia, reasonably controlled, lipid profile reveals a total cholesterol = 159, HDL = 66, LDL = 84 -Statin therapy deferred at this time in the presence of severe dementia.  4.SIRS,  fairly stable -Agree with currentmanagement.   5.Mechanical fall with subsequent right medial tibial plateau fracture and left humeral neck fracture -Agree with management per orthopedics.  6.Mixed Alzheimer's and vascular dementia, chronic with worsening -Agree with current management.   hypoglycemia that could be contributing to worsening confusion.  7.Malnutrition -We will need addressed prior to discharge.  Signed, Javier Docker Jasmeet Manton MD 01/11/2021, 9:16 AM

## 2021-01-12 LAB — CBC
HCT: 29.8 % — ABNORMAL LOW (ref 36.0–46.0)
Hemoglobin: 9.8 g/dL — ABNORMAL LOW (ref 12.0–15.0)
MCH: 31.9 pg (ref 26.0–34.0)
MCHC: 32.9 g/dL (ref 30.0–36.0)
MCV: 97.1 fL (ref 80.0–100.0)
Platelets: 149 10*3/uL — ABNORMAL LOW (ref 150–400)
RBC: 3.07 MIL/uL — ABNORMAL LOW (ref 3.87–5.11)
RDW: 15 % (ref 11.5–15.5)
WBC: 8.8 10*3/uL (ref 4.0–10.5)
nRBC: 0 % (ref 0.0–0.2)

## 2021-01-12 LAB — COMPREHENSIVE METABOLIC PANEL
ALT: 17 U/L (ref 0–44)
AST: 22 U/L (ref 15–41)
Albumin: 2.6 g/dL — ABNORMAL LOW (ref 3.5–5.0)
Alkaline Phosphatase: 29 U/L — ABNORMAL LOW (ref 38–126)
Anion gap: 7 (ref 5–15)
BUN: 16 mg/dL (ref 8–23)
CO2: 27 mmol/L (ref 22–32)
Calcium: 8 mg/dL — ABNORMAL LOW (ref 8.9–10.3)
Chloride: 104 mmol/L (ref 98–111)
Creatinine, Ser: 0.79 mg/dL (ref 0.44–1.00)
GFR, Estimated: >60 mL/min (ref >60)
Glucose, Bld: 100 mg/dL — ABNORMAL HIGH (ref 70–99)
Potassium: 3.8 mmol/L (ref 3.5–5.1)
Sodium: 138 mmol/L (ref 135–145)
Total Bilirubin: 1.5 mg/dL — ABNORMAL HIGH (ref 0.3–1.2)
Total Protein: 4.7 g/dL — ABNORMAL LOW (ref 6.5–8.1)

## 2021-01-12 LAB — CULTURE, BLOOD (ROUTINE X 2)
Culture: NO GROWTH
Culture: NO GROWTH
Special Requests: ADEQUATE
Special Requests: ADEQUATE

## 2021-01-12 NOTE — Progress Notes (Signed)
Patient Name: Haley Kim Date of Encounter: 01/12/2021  Hospital Problem List     Principal Problem:   Fall at home Active Problems:   Atrial fibrillation with RVR ([place])   Left humeral fracture   Tibial plateau fracture, right   Acute blood loss anemia    Patient Profile       75 year old female with PMH significant for mixed Alzheimer's and vascular dementia, hypercholesterolemia and COPD who was admitted to [facility] due to an unwitnessed fallresulting inright medial tibial plateau impacted fracture and left shoulder nondisplaced left humeral neck fracture.UponEDarrivalthe patient's ECG revealed atrial fibrillation with RVR   Subjective   Feels weak but otherwise unchanged  Inpatient Medications    . sodium chloride   Intravenous Once  . sodium chloride   Intravenous Once  . acidophilus   Oral Daily  . apixaban  2.5 mg Oral BID  . vitamin C  1,000 mg Oral Daily  . calcium carbonate  1,250 mg Oral Q breakfast  . cholecalciferol  2,000 Units Oral Daily  . donepezil  5 mg Oral QHS  . dronabinol  2.5 mg Oral QAC lunch  . escitalopram  5 mg Oral Daily  . magnesium oxide  400 mg Oral Daily  . metoprolol tartrate  12.5 mg Oral BID  . multivitamin with minerals  1 tablet Oral Daily  . vitamin E  400 Units Oral Daily    Vital Signs    Vitals:   01/11/21 1627 01/11/21 2022 01/12/21 0349 01/12/21 0744  BP: (!) 123/56 111/70 (!) 119/98 117/80  Pulse: 77 63 (!) 107 85  Resp: 16 17 17 16   Temp: 98 F (36.7 C) 97.7 F (36.5 C) 98.8 F (37.1 C) 98.1 F (36.7 C)  TempSrc: Oral Oral    SpO2: 92% 91% 95% 91%  Weight:   34.9 kg   Height:       No intake or output data in the 24 hours ending 01/12/21 1011 Filed Weights   01/10/21 0100 01/11/21 0300 01/12/21 0349  Weight: 37.2 kg 35.4 kg 34.9 kg    Physical Exam    GEN: Well nourished, well developed, in no acute distress.  HEENT: normal.  Neck: Supple, no JVD, carotid bruits, or masses. Cardiac: RRR, no  murmurs, rubs, or gallops. No clubbing, cyanosis, edema.  Radials/DP/PT 2+ and equal bilaterally.  Respiratory:  Respirations regular and unlabored, clear to auscultation bilaterally. GI: Soft, nontender, nondistended, BS + x 4. MS: no deformity or atrophy. Skin: warm and dry, no rash. Neuro:  Strength and sensation are intact. Psych: Normal affect.  Labs    CBC Recent Labs    01/11/21 0415 01/12/21 0433  WBC 9.4 8.8  HGB 11.1* 9.8*  HCT 33.1* 29.8*  MCV 96.2 97.1  PLT 129* 664*   Basic Metabolic Panel Recent Labs    01/11/21 0415 01/12/21 0433  NA 138 138  K 5.0 3.8  CL 103 104  CO2 29 27  GLUCOSE 128* 100*  BUN 19 16  CREATININE 0.80 0.79  CALCIUM 8.5* 8.0*   Liver Function Tests Recent Labs    01/11/21 0415 01/12/21 0433  AST 28 22  ALT 21 17  ALKPHOS 31* 29*  BILITOT 1.6* 1.5*  PROT 5.2* 4.7*  ALBUMIN 2.9* 2.6*   No results for input(s): LIPASE, AMYLASE in the last 72 hours. Cardiac Enzymes No results for input(s): CKTOTAL, CKMB, CKMBINDEX, TROPONINI in the last 72 hours. BNP No results for input(s): BNP in the last  72 hours. D-Dimer No results for input(s): DDIMER in the last 72 hours. Hemoglobin A1C No results for input(s): HGBA1C in the last 72 hours. Fasting Lipid Panel No results for input(s): CHOL, HDL, LDLCALC, TRIG, CHOLHDL, LDLDIRECT in the last 72 hours. Thyroid Function Tests No results for input(s): TSH, T4TOTAL, T3FREE, THYROIDAB in the last 72 hours.  Invalid input(s): FREET3  Telemetry    Sinus arrthmia  ECG    Aflutter   Radiology    DG Pelvis 1-2 Views  Result Date: 01/06/2021 CLINICAL DATA:  Unwitnessed fall at home. EXAM: PELVIS - 1-2 VIEW COMPARISON:  None. FINDINGS: Both hips are normally located. No definite acute hip fracture. The pubic symphysis and SI joints are intact. No definite pelvic fractures. IMPRESSION: No acute bony findings. Electronically Signed   By: Marijo Sanes M.D.   On: 01/06/2021 17:21   DG  Wrist 2 Views Left  Result Date: 01/07/2021 CLINICAL DATA:  Left wrist pain after fall yesterday EXAM: LEFT WRIST - 2 VIEW COMPARISON:  None. FINDINGS: Frontal and lateral views of the left wrist are obtained. There is an impacted fracture of the distal left radius with dorsal angulation. There appears to be intra-articular extension on the frontal view. Small displaced ulnar styloid fracture also noted. The radiocarpal joint remains intact. Incidental lunotriquetral coalition, and anatomic variant. There is diffuse soft tissue edema of the left wrist and distal forearm. IMPRESSION: 1. Impacted intra-articular fracture of the distal left radius, with dorsal angulation at the radiocarpal joint. 2. Minimally displaced ulnar styloid fracture. 3. Diffuse soft tissue edema. Electronically Signed   By: Randa Ngo M.D.   On: 01/07/2021 15:02   CT Head Wo Contrast  Result Date: 01/06/2021 CLINICAL DATA:  Patient found down on floor by has bone, unknown mechanism. Patient endorses neck and back pain. EXAM: CT HEAD WITHOUT CONTRAST CT CERVICAL SPINE WITHOUT CONTRAST TECHNIQUE: Multidetector CT imaging of the head and cervical spine was performed following the standard protocol without intravenous contrast. Multiplanar CT image reconstructions of the cervical spine were also generated. COMPARISON:  None. FINDINGS: CT HEAD FINDINGS Study is slightly motion degraded. Brain: No evidence of acute infarction, hemorrhage, hydrocephalus, extra-axial collection or mass lesion/mass effect. Global parenchymal volume loss with ex vacuo dilatation of the ventricular system. Mild volume of chronic ischemic white matter disease. 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 Vascular: No hyperdense vessel or unexpected calcification. Skull: Normal. Negative for fracture or focal lesion. Sinuses/Orbits: Paranasal sinuses are clear.  Prior lens surgery. Other: None CT CERVICAL SPINE FINDINGS Study is slightly motion degraded.  Alignment: Normal. Skull base and vertebrae: No acute fracture. No primary bone lesion or focal pathologic process. Soft tissues and spinal canal: No prevertebral fluid or swelling. No visible canal hematoma. Disc levels: Multilevel degenerative changes spine with disc space narrowing, disc osteophyte complex ease, uncovertebral and facet hypertrophy, most significant in the lower cervical spine. Upper chest: Biapical scarring. Other: None IMPRESSION: Study is slightly motion degraded.  Within this context: 1.  There is no acute intracranial abnormality 2.  No visualized fracture or subluxation of the cervical spine. Electronically Signed   By: Dahlia Bailiff MD   On: 01/06/2021 16:27   CT Cervical Spine Wo Contrast  Result Date: 01/06/2021 CLINICAL DATA:  Patient found down on floor by has bone, unknown mechanism. Patient endorses neck and back pain. EXAM: CT HEAD WITHOUT CONTRAST CT CERVICAL SPINE WITHOUT CONTRAST TECHNIQUE: Multidetector CT imaging of the head and cervical spine was performed  following the standard protocol without intravenous contrast. Multiplanar CT image reconstructions of the cervical spine were also generated. COMPARISON:  None. FINDINGS: CT HEAD FINDINGS Study is slightly motion degraded. Brain: No evidence of acute infarction, hemorrhage, hydrocephalus, extra-axial collection or mass lesion/mass effect. Global parenchymal volume loss with ex vacuo dilatation of the ventricular system. Mild volume of chronic ischemic white matter disease. 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 Vascular: No hyperdense vessel or unexpected calcification. Skull: Normal. Negative for fracture or focal lesion. Sinuses/Orbits: Paranasal sinuses are clear.  Prior lens surgery. Other: None CT CERVICAL SPINE FINDINGS Study is slightly motion degraded. Alignment: Normal. Skull base and vertebrae: No acute fracture. No primary bone lesion or focal pathologic process. Soft tissues and spinal canal: No  prevertebral fluid or swelling. No visible canal hematoma. Disc levels: Multilevel degenerative changes spine with disc space narrowing, disc osteophyte complex ease, uncovertebral and facet hypertrophy, most significant in the lower cervical spine. Upper chest: Biapical scarring. Other: None IMPRESSION: Study is slightly motion degraded.  Within this context: 1.  There is no acute intracranial abnormality 2.  No visualized fracture or subluxation of the cervical spine. Electronically Signed   By: Dahlia Bailiff MD   On: 01/06/2021 16:27   CT Knee Right Wo Contrast  Result Date: 01/06/2021 CLINICAL DATA:  [place].  Abnormal x-rays. EXAM: CT OF THE right KNEE WITHOUT CONTRAST TECHNIQUE: Multidetector CT imaging of the right knee was performed according to the standard protocol. Multiplanar CT image reconstructions were also generated. COMPARISON:  Right knee radiographs, same date. FINDINGS: Nondisplaced but slightly impacted medial tibial plateau fracture. Small fracture lines along the intra-articular portion accounting for the lipohemarthrosis. The tibial spines and lateral tibial plateau are intact. No femur, fibula or patella fractures. Advanced changes of osteoporosis. Moderate-sized lipohemarthrosis is noted. Grossly by CT the cruciate and collateral ligaments are intact. The quadriceps and patellar tendons are intact. IMPRESSION: 1. Nondisplaced but slightly impacted medial tibial plateau fracture. 2. Moderate-sized lipohemarthrosis. 3. Advanced changes of osteoporosis. 4. No other fractures are identified. Electronically Signed   By: Marijo Sanes M.D.   On: 01/06/2021 19:06   DG Shoulder Left  Result Date: 01/06/2021 CLINICAL DATA:  Found down at home.  Left shoulder pain. EXAM: LEFT SHOULDER - 2+ VIEW COMPARISON:  None. FINDINGS: Nondisplaced fracture noted through the humeral neck and probably involving the base of the greater tuberosity. The [facility] joint is intact. No clavicle fracture. The visualized  left ribs are intact and the visualized left lung is grossly clear. IMPRESSION: Nondisplaced left humeral neck fracture. Electronically Signed   By: Marijo Sanes M.D.   On: 01/06/2021 17:17   DG Knee Complete 4 Views Right  Result Date: 01/06/2021 CLINICAL DATA:  Found down at home.  Right knee pain. EXAM: RIGHT KNEE - COMPLETE 4+ VIEW COMPARISON:  None FINDINGS: The joint spaces are maintained. No acute fracture is identified. However, there is a lipohemarthrosis noted on the lateral film, highly suggestive of fracture. Recommend CT for further evaluation. IMPRESSION: No definite acute fracture. However, suspect lipohemarthrosis highly suggestive of fracture. CT is recommended for further evaluation. Electronically Signed   By: Marijo Sanes M.D.   On: 01/06/2021 17:20   ECHOCARDIOGRAM COMPLETE  Result Date: 01/08/2021    ECHOCARDIOGRAM REPORT   Patient Name:   KAMEE BOBST Date of Exam: 01/07/2021 Medical Rec #:  [id number]          Height:       66.3 in Accession #:  [id number]         Weight:       70.0 lb Date of Birth:  08/31/1946          BSA:          1.284 m Patient Age:    45 years           BP:           121/64 mmHg Patient Gender: F                  HR:           105 bpm. Exam Location:  [facility] Procedure: 2D Echo, Cardiac Doppler and Color Doppler Indications:    Atrial Fibrillation I48.91  History:        Patient has no prior history of Echocardiogram examinations.                 COPD.  Sonographer:    Sherrie Sport RDCS (AE) Referring Phys: [id number] Mary Sella A MANSY Diagnosing      Yolonda Kida MD Phys:  Sonographer Comments: Technically difficult study due to poor echo windows, no apical window and suboptimal parasternal window. Image acquisition challenging due to patient body habitus and Image acquisition challenging due to COPD. IMPRESSIONS  1. Left ventricular ejection fraction, by estimation, is 50 to 55%. The left ventricle has low normal function. The left ventricle has no regional wall  motion abnormalities. Left ventricular diastolic parameters were normal.  2. Right ventricular systolic function is normal. The right ventricular size is normal.  3. The mitral valve is normal in structure. No evidence of mitral valve regurgitation.  4. The aortic valve is normal in structure. Aortic valve regurgitation is not visualized. Conclusion(s)/Recommendation(s): Poor windows for evaluation of left ventricular function by transthoracic echocardiography. Would recommend an alternative means of evaluation. FINDINGS  Left Ventricle: Left ventricular ejection fraction, by estimation, is 50 to 55%. The left ventricle has low normal function. The left ventricle has no regional wall motion abnormalities. The left ventricular internal cavity size was normal in size. There is no left ventricular hypertrophy. Left ventricular diastolic parameters were normal. Right Ventricle: The right ventricular size is normal. No increase in right ventricular wall thickness. Right ventricular systolic function is normal. Left Atrium: Left atrial size was normal in size. Right Atrium: Right atrial size was normal in size. Pericardium: There is no evidence of pericardial effusion. Mitral Valve: The mitral valve is normal in structure. No evidence of mitral valve regurgitation. Tricuspid Valve: The tricuspid valve is not well visualized. Tricuspid valve regurgitation is trivial. Aortic Valve: The aortic valve is normal in structure. Aortic valve regurgitation is not visualized. Pulmonic Valve: The pulmonic valve was normal in structure. Pulmonic valve regurgitation is not visualized. Aorta: The aortic arch was not well visualized. IAS/Shunts: No atrial level shunt detected by color flow Doppler.  LEFT VENTRICLE PLAX 2D LVIDd:         3.23 cm LVIDs:         2.59 cm LV PW:         0.83 cm LV IVS:        0.61 cm LVOT diam:     2.00 cm LVOT Area:     3.14 cm  LEFT ATRIUM         Index LA diam:    2.40 cm 1.87 cm/m  PULMONIC VALVE AORTA                 PV Vmax:        0.77 m/s Ao Root diam: 2.95 cm PV Peak grad:   2.4 mmHg                       RVOT Peak grad: 2 mmHg   SHUNTS Systemic Diam: 2.00 cm Yolonda Kida MD Electronically signed by Yolonda Kida MD Signature Date/Time: 01/08/2021/8:09:16 AM    Final     Assessment & Plan     Principal Problem: Fall at home Active Problems: Atrial fibrillation with RVR ([place]) Left humeral fracture Tibial plateau fracture, right Acute blood loss anemia     1. Atrial flutter/ atrial fibrillation with RVR: the patient converted from Atrial fib back to NSR on diltiazem gtt however access was lost and diltiazem was discontinued. Currently still in A. fib.Will place back on p.o. short acting diltiazem to attempt to improve rate and possibly convert to sinus rhythm.  Will consider p.o. amiodarone load amiodarone if rate continues to be a problem. -Echocardiogram results reveal a low normal LV systolic function with estimated EF of 50-55%. -Recommending holdingEliquis therapy at this time in the presence of severe anemia. -Wewill continue metoprolol tartrate 12.5mg  twice daily for rate control    2.Anemia, reasonably stable, H&H is 11.1 today down from twelve.  Will need to closely follow.  Remain off of Eliquis   3.Hypercholesteremia, reasonably controlled, lipid profile reveals a total cholesterol = 159, HDL = 66, LDL = 84 -Statin therapy deferred at this time in the presence of severe dementia.  4.SIRS, fairly stable -Agree with currentmanagement.   5.Mechanical fall with subsequent right medial tibial plateau fracture and left humeral neck fracture -Agree with management per orthopedics.  6.Mixed Alzheimer's and vascular dementia, chronic with worsening -Agree with current  management.  hypoglycemia that could be contributing to worsening confusion.  7.Malnutrition -We will need addressed prior to discharge.   Signed, Javier Docker Adrie Picking MD 01/12/2021, 10:11 AM  Pager: [phone]

## 2021-01-12 NOTE — Progress Notes (Signed)
Progress Note    Haley Kim  QQP:[id number] DOB: June 14, 1946  DOA: 01/06/2021 PCP: Adin Hector, MD      Brief Narrative:   Haley Kim is a 75 y.o. female COPD, dyslipidemia, dementia, who presented to the hospital after a fall at home.  Reportedly, there was no loss of consciousness.  In the ED, she was tachycardic and she was found to have atrial fibrillation with RVR.  Work-up revealed left humeral neck fracture, right medial tibial plateau fracture complicated by moderate-sized lipohemarthrosis, fracture of the distal left radius and minimally displaced left ulnar styloid fracture. She was treated with IV Cardizem infusion for rapid A. fib.  She developed acute blood loss anemia requiring 2 units of packed red blood cells.  She was also treated with IV fluids and analgesics.  She was evaluated by the orthopedic surgeon who recommended conservative management. TRH admitted.  Patient continues to have poor ambulation status in the setting of multiple fractures, ultimate disposition SNF at this point awaiting acceptance and insurance approval.  She continues to have A. fib with RVR rebounding, cardiology following, appreciate insight and recommendations.  Appears to be somewhat difficult to control.  Once heart rate is better controlled and patient is able to better participate with therapy will likely discharge to SNF in the next few days again pending clinical course and heart rate.  Assessment/Plan:   Principal Problem:   Fall at home Active Problems:   Atrial fibrillation with RVR (HCC)   Left humeral fracture   Tibial plateau fracture, right   Acute blood loss anemia  S/p fall, left humeral neck fracture, right medial tibial plateau fracture, moderate sized lipohemarthrosis, distal left radius fracture, minimally displaced left ulnar styloid fracture: Conservative management recommended. Per ortho "Avoid any weightbearing through the left upper extremity or  shoulder". Continue analgesics as needed for pain.  Follow-up in orthopedic surgeon. PT and OT currently recommending SNF.    Atrial fibrillation with RVR:  Continues off Cardizem Cardiology to continue to adjust rate control medications Current none metoprolol 12.5 twice daily, as needed Lopressor IV Hold Eliquis; right knee lipohemarthrosis appears stable but anemia continues to downtrend  Acute blood loss anemia: Hemoglobin improved s/p 2 units of PRBCs on 01/07/2021. R knee lipohemarthrosis and likely somewhat dilutional. Rebounded nearly 7 points with just 2 units at admission -concern for lab error with initial profound anemia.   Hypotension: Discontinue IV fluids to avoid fluid overload.  Monitor BP off of IV  fluids.  Severe protein caloric malnutrition  Body mass index is 13.14 kg/m.  (Underweight): This profoundly complicates overall prognosis.  Consult dietitian.  SIRS: In the setting of trauma. No evidence of infection at this time; does not meet sepsis criteria.  Antibiotics have been discontinued.  Hypomagnesemia, resolved  Dementia: Continue Aricept  Consultants:  Cardiologist  Orthopedic surgeon  Procedures:  None planned  Medications:   . sodium chloride   Intravenous Once  . sodium chloride   Intravenous Once  . acidophilus   Oral Daily  . apixaban  2.5 mg Oral BID  . vitamin C  1,000 mg Oral Daily  . calcium carbonate  1,250 mg Oral Q breakfast  . cholecalciferol  2,000 Units Oral Daily  . donepezil  5 mg Oral QHS  . dronabinol  2.5 mg Oral QAC lunch  . escitalopram  5 mg Oral Daily  . magnesium oxide  400 mg Oral Daily  . metoprolol tartrate  12.5 mg  Oral BID  . multivitamin with minerals  1 tablet Oral Daily  . vitamin E  400 Units Oral Daily   Continuous Infusions:    Anti-infectives (From admission, onward)   Start     Dose/Rate Route Frequency Ordered Stop   01/08/21 1000  vancomycin (VANCOREADY) IVPB 500 mg/100 mL  Status:  Discontinued         500 mg 100 mL/hr over 60 Minutes Intravenous Every 36 hours 01/07/21 1217 01/07/21 1410   01/07/21 2100  ceFEPIme (MAXIPIME) 2 g in sodium chloride 0.9 % 100 mL IVPB  Status:  Discontinued        2 g 200 mL/hr over 30 Minutes Intravenous Every 24 hours 01/06/21 2041 01/07/21 1410   01/07/21 2100  vancomycin (VANCOREADY) IVPB 500 mg/100 mL  Status:  Discontinued        500 mg 100 mL/hr over 60 Minutes Intravenous Every 48 hours 01/06/21 2041 01/07/21 1217   01/06/21 2130  ceFEPIme (MAXIPIME) 2 g in sodium chloride 0.9 % 100 mL IVPB  Status:  Discontinued        2 g 200 mL/hr over 30 Minutes Intravenous  Once 01/06/21 2129 01/06/21 2148   01/06/21 2130  metroNIDAZOLE (FLAGYL) IVPB 500 mg  Status:  Discontinued        500 mg 100 mL/hr over 60 Minutes Intravenous Every 8 hours 01/06/21 2129 01/06/21 2148   01/06/21 2130  vancomycin (VANCOCIN) IVPB 1000 mg/200 mL premix  Status:  Discontinued        1,000 mg 200 mL/hr over 60 Minutes Intravenous  Once 01/06/21 2129 01/06/21 2149   01/06/21 2030  ceFEPIme (MAXIPIME) 2 g in sodium chloride 0.9 % 100 mL IVPB        2 g 200 mL/hr over 30 Minutes Intravenous  Once 01/06/21 2017 01/06/21 2250   01/06/21 2030  metroNIDAZOLE (FLAGYL) IVPB 500 mg  Status:  Discontinued        500 mg 100 mL/hr over 60 Minutes Intravenous Every 8 hours 01/06/21 2017 01/07/21 1410   01/06/21 2030  vancomycin (VANCOREADY) IVPB 750 mg/150 mL        750 mg 150 mL/hr over 60 Minutes Intravenous  Once 01/06/21 2017 01/07/21 0029      Family Communication/Anticipated D/C date and plan/Code Status   DVT prophylaxis: Eliquis on hold given downtrending anemia    Code Status: Full Code  Family Communication: Lengthy discussion with husband about disposition, prognosis and lengthy recovery in the setting of multiple fractures advanced age and poor nutrition.  Disposition Plan:  Status is: Inpatient  Dispo: The patient is from: Home              Anticipated d/c is  to: SNF              Anticipated d/c date is: 24-48h pending placement/approval              Patient currently is medically stable to d/c.   Difficult to place patient No  Subjective:   No acute issues or events overnight, remains in A. fib but much more rate controlled today.  Review of systems limited but patient denies any overt pain chest pain nausea vomiting or shortness of breath.  Objective:    Vitals:   01/11/21 1627 01/11/21 2022 01/12/21 0349 01/12/21 0744  BP: (!) 123/56 111/70 (!) 119/98 117/80  Pulse: 77 63 (!) 107 85  Resp: 16 17 17 16   Temp: 98 F (36.7 C) 97.7 F (  36.5 C) 98.8 F (37.1 C) 98.1 F (36.7 C)  TempSrc: Oral Oral    SpO2: 92% 91% 95% 91%  Weight:   34.9 kg   Height:       No data found.  No intake or output data in the 24 hours ending 01/12/21 0756 Filed Weights   01/10/21 0100 01/11/21 0300 01/12/21 0349  Weight: 37.2 kg 35.4 kg 34.9 kg    Exam:  GEN: NAD SKIN: Warm and dry EYES: No pallor or icterus ENT: MMM CV: Regular rate and rhythm PULM: CTA B ABD: soft, ND, NT, +BS CNS: Without overt deficit EXT: Left arm in a sling.  Brace on right knee.   Data Reviewed:   I have personally reviewed following labs and imaging studies:  Labs: Labs show the following:   Basic Metabolic Panel: Recent Labs  Lab 01/07/21 0520 01/09/21 0627 01/10/21 0422 01/11/21 0415 01/12/21 0433  NA 144 141 141 138 138  K 3.8 4.7 4.5 5.0 3.8  CL 116* 109 107 103 104  CO2 22 25 26 29 27   GLUCOSE 146* 103* 125* 128* 100*  BUN 16 19 20 19 16   CREATININE 0.75 0.88 0.83 0.80 0.79  CALCIUM 6.7* 8.6* 8.7* 8.5* 8.0*  MG 1.6* 2.2  --   --   --    GFR Estimated Creatinine Clearance: 32.4 mL/min (by C-G formula based on SCr of 0.79 mg/dL). Liver Function Tests: Recent Labs  Lab 01/06/21 2041 01/10/21 0422 01/11/21 0415 01/12/21 0433  AST 23 34 28 22  ALT 21 22 21 17   ALKPHOS 23* 30* 31* 29*  BILITOT 1.1 2.0* 1.6* 1.5*  PROT 5.4* 5.6* 5.2* 4.7*   ALBUMIN 3.5 3.1* 2.9* 2.6*   No results for input(s): LIPASE, AMYLASE in the last 168 hours. No results for input(s): AMMONIA in the last 168 hours. Coagulation profile Recent Labs  Lab 01/06/21 2041  INR 1.2    CBC: Recent Labs  Lab 01/06/21 2041 01/07/21 0520 01/07/21 1019 01/07/21 2313 01/09/21 0627 01/10/21 0422 01/11/21 0415 01/12/21 0433  WBC 17.2* 13.9*  --   --  14.0* 11.0* 9.4 8.8  NEUTROABS 15.6*  --   --   --  12.3*  --   --   --   HGB 9.1* 7.1*   < > 12.3 12.9 12.0 11.1* 9.8*  HCT 27.5* 21.6*   < > 35.9* 37.4 34.7* 33.1* 29.8*  MCV 100.7* 102.4*  --   --  94.9 95.1 96.2 97.1  PLT 139* 103*  --   --  107* 122* 129* 149*   < > = values in this interval not displayed.   Cardiac Enzymes: No results for input(s): CKTOTAL, CKMB, CKMBINDEX, TROPONINI in the last 168 hours. BNP (last 3 results) No results for input(s): PROBNP in the last 8760 hours. CBG: No results for input(s): GLUCAP in the last 168 hours. D-Dimer: No results for input(s): DDIMER in the last 72 hours. Hgb A1c: No results for input(s): HGBA1C in the last 72 hours. Lipid Profile: No results for input(s): CHOL, HDL, LDLCALC, TRIG, CHOLHDL, LDLDIRECT in the last 72 hours. Thyroid function studies: No results for input(s): TSH, T4TOTAL, T3FREE, THYROIDAB in the last 72 hours.  Invalid input(s): FREET3 Anemia work up: No results for input(s): VITAMINB12, FOLATE, FERRITIN, TIBC, IRON, RETICCTPCT in the last 72 hours. Sepsis Labs: Recent Labs  Lab 01/06/21 2041 01/07/21 0520 01/09/21 4765 01/10/21 0422 01/11/21 0415 01/12/21 0433  PROCALCITON 0.29  --   --   --   --   --  WBC 17.2* 13.9* 14.0* 11.0* 9.4 8.8  LATICACIDVEN 2.3* 1.8  --   --   --   --     Microbiology Recent Results (from the past 240 hour(s))  SARS Coronavirus 2 by RT PCR (hospital order, performed in [facility] hospital lab) Nasopharyngeal Nasopharyngeal Swab     Status: None   Collection Time: 01/06/21  8:41 PM    Specimen: Nasopharyngeal Swab  Result Value Ref Range Status   SARS Coronavirus 2 NEGATIVE NEGATIVE Final    Comment: (NOTE) SARS-CoV-2 target nucleic acids are NOT DETECTED.  The SARS-CoV-2 RNA is generally detectable in upper and lower respiratory specimens during the acute phase of infection. The lowest concentration of SARS-CoV-2 viral copies this assay can detect is 250 copies / mL. A negative result does not preclude SARS-CoV-2 infection and should not be used as the sole basis for treatment or other patient management decisions.  A negative result may occur with improper specimen collection / handling, submission of specimen other than nasopharyngeal swab, presence of viral mutation(s) within the areas targeted by this assay, and inadequate number of viral copies (<250 copies / mL). A negative result must be combined with clinical observations, patient history, and epidemiological information.  Fact Sheet for Patients:   StrictlyIdeas.no  Fact Sheet for Healthcare Providers: BankingDealers.co.za  This test is not yet approved or  cleared by the [place] FDA and has been authorized for detection and/or diagnosis of SARS-CoV-2 by FDA under an Emergency Use Authorization (EUA).  This EUA will remain in effect (meaning this test can be used) for the duration of the COVID-19 declaration under Section 564(b)(1) of the Act, 21 U.S.C. section 360bbb-3(b)(1), unless the authorization is terminated or revoked sooner.  Performed at [facility], [address] [place], [place] [address]   Culture, blood (x 2)     Status: None   Collection Time: 01/06/21  8:41 PM   Specimen: BLOOD  Result Value Ref Range Status   Specimen Description BLOOD LEFT ANTECUBITAL  Final   Special Requests   Final    BOTTLES DRAWN AEROBIC AND ANAEROBIC Blood Culture adequate volume   Culture   Final    NO GROWTH 5 DAYS Performed at [facility], [address] [place], [place] [address]    Report Status 01/12/2021 FINAL  Final  Culture, blood (x 2)     Status: None   Collection Time: 01/06/21  8:41 PM   Specimen: BLOOD  Result Value Ref Range Status   Specimen Description BLOOD RIGHT ANTECUBITAL  Final   Special Requests   Final    BOTTLES DRAWN AEROBIC AND ANAEROBIC Blood Culture adequate volume   Culture   Final    NO GROWTH 5 DAYS Performed at [facility], [address], [place], [place] [address]    Report Status 01/12/2021 FINAL  Final    Procedures and diagnostic studies:  No results found.   LOS: 6 days   [facility]   Pager via secure chat  If 7PM-7AM, please contact night-coverage at [email or web address]  01/12/2021, 7:56 AM

## 2021-01-12 NOTE — Progress Notes (Signed)
Physical Therapy Treatment Patient Details Name: Haley Kim MRN: [id number] DOB: 1945/12/21 Today's Date: 01/12/2021    History of Present Illness 75 y.o. female COPD, dyslipidemia, dementia, who presented to the hospital after a fall at home.  Reportedly, there was no loss of consciousness.  In the ED, she was tachycardic and she was found to have atrial fibrillation with RVR.  Work-up revealed left humeral neck fracture, right medial tibial plateau fracture complicated by moderate-sized lipohemarthrosis, fracture of the distal left radius and minimally displaced left ulnar styloid fracture.    PT Comments    Participated in exercises as described below.  Braces adjusted for proper fit and comfort.  To EOB with mod a x 1.  Pt does attempt to assist but still needs significant assist due to cognition.  She remains sitting x 10 minutes with HR response generally steady.  She does increase to 120's at times but overall does well.  Returned to supine for comfort.  Deferred standing given inability to follow NWB limitations.   Follow Up Recommendations  SNF;Supervision/Assistance - 24 hour     Equipment Recommendations       Recommendations for Other Services       Precautions / Restrictions Precautions Precautions: Fall Required Braces or Orthoses: Knee Immobilizer - Right;Sling;Other Brace Knee Immobilizer - Right: On at all times Other Brace: wrist brace  and sling on L UE NWB, KI on RLE Restrictions Weight Bearing Restrictions: Yes LUE Weight Bearing: Non weight bearing RLE Weight Bearing: Non weight bearing    Mobility  Bed Mobility Overal bed mobility: Needs Assistance Bed Mobility: Supine to Sit;Sit to Supine     Supine to sit: Mod assist;Max assist Sit to supine: Max assist        Transfers                 General transfer comment: deferred  Ambulation/Gait                 Stairs             Wheelchair Mobility    Modified  Rankin (Stroke Patients Only)       Balance Overall balance assessment: Needs assistance Sitting-balance support: Feet supported Sitting balance-Leahy Scale: Fair Sitting balance - Comments: min guard for sitting balance                                    Cognition Arousal/Alertness: Awake/alert Behavior During Therapy: WFL for tasks assessed/performed Overall Cognitive Status: History of cognitive impairments - at baseline                                        Exercises Total Joint Exercises Ankle Circles/Pumps: AROM;Strengthening;Both;20 reps;Supine Quad Sets: AROM;Strengthening;Both;10 reps;Supine Gluteal Sets: AROM;Strengthening;Both;10 reps;Supine Heel Slides: AROM;Strengthening;Left;10 reps;Supine Hip ABduction/ADduction: AROM;Strengthening;Left;10 reps;Supine Straight Leg Raises: AROM;Strengthening;Left;10 reps;Supine    General Comments        Pertinent Vitals/Pain Pain Assessment: No/denies pain Pain Intervention(s): Limited activity within patient's tolerance;Monitored during session;Repositioned    Home Living                      Prior Function            PT Goals (current goals can now be found in the care plan section) Progress towards PT  goals: Progressing toward goals    Frequency    7X/week      PT Plan      Co-evaluation              AM-PAC PT "6 Clicks" Mobility   Outcome Measure  Help needed turning from your back to your side while in a flat bed without using bedrails?: A Lot Help needed moving from lying on your back to sitting on the side of a flat bed without using bedrails?: A Lot Help needed moving to and from a bed to a chair (including a wheelchair)?: A Lot Help needed standing up from a chair using your arms (e.g., wheelchair or bedside chair)?: Total Help needed to walk in hospital room?: Total Help needed climbing 3-5 steps with a railing? : Total 6 Click Score: 9    End  of Session Equipment Utilized During Treatment: Right knee immobilizer Activity Tolerance: Patient tolerated treatment well Patient left: in bed;with bed alarm set;with family/visitor present Nurse Communication: Mobility status PT Visit Diagnosis: Unsteadiness on feet (R26.81);Other abnormalities of gait and mobility (R26.89);Repeated falls (R29.6);Muscle weakness (generalized) (M62.81);History of falling (Z91.81);Difficulty in walking, not elsewhere classified (R26.2);Pain Pain - Right/Left: Right Pain - part of body: Leg     Time: 5916-3846 PT Time Calculation (min) (ACUTE ONLY): 24 min  Charges:  $Therapeutic Exercise: 8-22 mins $Therapeutic Activity: 8-22 mins                    Chesley Noon, PTA 01/12/21, 1:41 PM

## 2021-01-13 MED ORDER — AMIODARONE HCL 200 MG PO TABS
200.0000 mg | ORAL_TABLET | Freq: Two times a day (BID) | ORAL | Status: DC
  Administered 2021-01-13 – 2021-01-16 (×7): 200 mg via ORAL
  Filled 2021-01-13 (×7): qty 1

## 2021-01-13 NOTE — TOC Progression Note (Signed)
Transition of Care [facility]) - Progression Note    Patient Details  Name: Haley Kim MRN: [id number] Date of Birth: [birth year]  [facility] CM/SW Rains, RN Phone Number: 01/13/2021, 3:26 PM  Clinical Narrative: Patient and Daughter at bedside, Blood Pressure unit for return home given with instructions per Cardiac Nurse.           Expected Discharge Plan and Services                                                 Social Determinants of Health (SDOH) Interventions    Readmission Risk Interventions No flowsheet data found.

## 2021-01-13 NOTE — Care Management Important Message (Signed)
Important Message  Patient Details  Name: Haley Kim MRN: [id number] Date of Birth: March 13, 1946   Medicare Important Message Given:  Yes     Dannette Barbara 01/13/2021, 12:07 PM

## 2021-01-13 NOTE — Progress Notes (Signed)
Haley Kim    SUBJECTIVE: Haley Kim is a 75 year old female with a past medical history significant for COPD, hypercholesterolemia, and mixed Alzheimer's and vascular dementia who presented to the ED following an unwitnessed fall resulting in a right medical tibial plateau impacted fracture and a left shoulder nondisplaced left humeral neck fracture.  Upon arrival, she was noted to be in atrial fibrillation with RVR which warranted cardiac consultation.    01/13/21:  Haley Kim is currently lying in bed, in no acute distress.  She is somewhat of a poor historian due to underlying dementia, but denies any current chest pain or shortness of breath.     Vitals:   01/12/21 2100 01/13/21 0100 01/13/21 0416 01/13/21 0742  BP: (!) 101/54  123/80 130/84  Pulse: (!) 102  (!) 104 64  Resp: 18  18 16   Temp: 98.1 F (36.7 C)  98.1 F (36.7 C) 98.1 F (36.7 C)  TempSrc: Oral   Oral  SpO2: 96%  93% 94%  Weight:  35.8 kg    Height:         Intake/Output Summary (Last 24 hours) at 01/13/2021 0755 Last data filed at 01/12/2021 1815 Gross per 24 hour  Intake 360 ml  Output 2 ml  Net 358 ml      PHYSICAL EXAM  General: Thin, frail HEENT:  Normocephalic and atramatic Neck:  No JVD.  Lungs: Clear bilaterally to auscultation and percussion. Heart: Irregularly irregular, rapid ventricular response . Normal S1 and S2 without gallops or murmurs.  Abdomen: Bowel sounds are positive, abdomen soft and non-tender  Msk:  Back normal.  Normal strength and tone for age. Extremities: No clubbing, cyanosis or edema.   Neuro: Pleasantly confused  Psych:  Pleasantly confused    LABS: Basic Metabolic Panel: Recent Labs    01/11/21 0415 01/12/21 0433  NA 138 138  K 5.0 3.8  CL 103 104  CO2 29 27  GLUCOSE 128* 100*  BUN 19 16  CREATININE 0.80 0.79  CALCIUM 8.5* 8.0*   Liver Function Tests: Recent Labs    01/11/21 0415 01/12/21 0433  AST 28 22  ALT 21 17  ALKPHOS 31* 29*  BILITOT  1.6* 1.5*  PROT 5.2* 4.7*  ALBUMIN 2.9* 2.6*   No results for input(s): LIPASE, AMYLASE in the last 72 hours. CBC: Recent Labs    01/11/21 0415 01/12/21 0433  WBC 9.4 8.8  HGB 11.1* 9.8*  HCT 33.1* 29.8*  MCV 96.2 97.1  PLT 129* 149*   Cardiac Enzymes: No results for input(s): CKTOTAL, CKMB, CKMBINDEX, TROPONINI in the last 72 hours. BNP: Invalid input(s): POCBNP D-Dimer: No results for input(s): DDIMER in the last 72 hours. Hemoglobin A1C: No results for input(s): HGBA1C in the last 72 hours. Fasting Lipid Panel: No results for input(s): CHOL, HDL, LDLCALC, TRIG, CHOLHDL, LDLDIRECT in the last 72 hours. Thyroid Function Tests: No results for input(s): TSH, T4TOTAL, T3FREE, THYROIDAB in the last 72 hours.  Invalid input(s): FREET3 Anemia Panel: No results for input(s): VITAMINB12, FOLATE, FERRITIN, TIBC, IRON, RETICCTPCT in the last 72 hours.  No results found.   Echo: Normal RV systolic function and low normal LV systolic function with an EF estimated between 50-55% with no evidence of wall motion abnormalities or significant valvular abnormalities.    TELEMETRY: Atrial fibrillation with RVR, rate in the 120s - 130s   ASSESSMENT AND PLAN:  Principal Problem:   Fall at home Active Problems:   Atrial fibrillation with RVR (  [place])   Left humeral fracture   Tibial plateau fracture, right   Acute blood loss anemia    1.  Atrial fibrillation with RVR   -Rate currently in the 120s-130s  -Will add amiodarone 200mg  BID for better rate control   -Continue metoprolol 12.5mg  BID   -Will continue to hold Eliquis due to anemia, fall risk   2.  Anemia   -9.8/29.8 this morning; will continue to hold Eliquis at this time   3.  Hypercholesterolemia   -Agree to continue to hold statin due to underlying dementia   The history, physical exam findings, and plan of care were all discussed with Dr. Bartholome Bill, and all decision making was made in collaboration.   Haley Arenas  PA-C 01/13/2021 7:55 AM

## 2021-01-13 NOTE — Progress Notes (Signed)
Physical Therapy Treatment Patient Details Name: Haley Kim MRN: [id number] DOB: July 24, 1946 Today's Date: 01/13/2021    History of Present Illness 75 y.o. female COPD, dyslipidemia, dementia, who presented to the hospital after a fall at home.  Reportedly, there was no loss of consciousness.  In the ED, she was tachycardic and she was found to have atrial fibrillation with RVR.  Work-up revealed left humeral neck fracture, right medial tibial plateau fracture complicated by moderate-sized lipohemarthrosis, fracture of the distal left radius and minimally displaced left ulnar styloid fracture.    PT Comments    Participated in exercises as described below.  Good effort to get to EOB with min a x 1 and cues.  She is able to remain sitting x 30 minutes with general comfort and good tolerance.  HR monitored and is generally steady during session with some short increases to high 120's but generally remains 100-110's.  Standing is deferred given WB restrictions and her inability to follow then given cognition and strength.  Anticipate she would be able to tolerate sqat pivot or lateral scoot transfer to recliner tomorrow given session today.   Follow Up Recommendations  SNF;Supervision/Assistance - 24 hour     Equipment Recommendations       Recommendations for Other Services       Precautions / Restrictions Precautions Precautions: Fall Required Braces or Orthoses: Knee Immobilizer - Right;Sling;Other Brace Knee Immobilizer - Right: On at all times Other Brace: wrist brace  and sling on L UE NWB, KI on RLE Restrictions Weight Bearing Restrictions: Yes LUE Weight Bearing: Non weight bearing RLE Weight Bearing: Non weight bearing    Mobility  Bed Mobility Overal bed mobility: Needs Assistance Bed Mobility: Supine to Sit;Sit to Supine     Supine to sit: Min assist Sit to supine: Mod assist   General bed mobility comments: HR stable this session and good effort by pt to get  to sitting.    Transfers Overall transfer level: Needs assistance               General transfer comment: deferred  Ambulation/Gait                 Stairs             Wheelchair Mobility    Modified Rankin (Stroke Patients Only)       Balance Overall balance assessment: Needs assistance Sitting-balance support: Feet supported Sitting balance-Leahy Scale: Fair Sitting balance - Comments: min guard for sitting balance                                    Cognition Arousal/Alertness: Awake/alert Behavior During Therapy: WFL for tasks assessed/performed Overall Cognitive Status: History of cognitive impairments - at baseline                                 General Comments: Pt with decreased short term memory, able to follow one-step commands with 1-2 verbal cues      Exercises Total Joint Exercises Ankle Circles/Pumps: AROM;Strengthening;Both;20 reps;Supine Quad Sets: AROM;Strengthening;Both;10 reps;Supine Gluteal Sets: AROM;Strengthening;Both;10 reps;Supine Heel Slides: AROM;Strengthening;Left;10 reps;Supine Hip ABduction/ADduction: AROM;Strengthening;Left;10 reps;Supine Straight Leg Raises: AROM;Strengthening;Left;10 reps;Supine Other Exercises Other Exercises: sitting EOB x 30 minutes Other Exercises: provided setup assist for bedlevel grooming, moderate assist for supine <> sit    General Comments General comments (skin integrity,  edema, etc.): HR = 100bpm at rest, elevated to 115-120 with attempt to complete supine > sit (lowers quickly with rest)      Pertinent Vitals/Pain Pain Assessment: Faces Faces Pain Scale: Hurts a little bit Pain Location: L shoulder Pain Descriptors / Indicators: Discomfort;Grimacing;Guarding Pain Intervention(s): Limited activity within patient's tolerance;Monitored during session;Premedicated before session    Home Living                      Prior Function             PT Goals (current goals can now be found in the care plan section) Acute Rehab PT Goals Patient Stated Goal: husband to be able to take care of her Progress towards PT goals: Progressing toward goals    Frequency    7X/week      PT Plan Current plan remains appropriate    Co-evaluation              AM-PAC PT "6 Clicks" Mobility   Outcome Measure  Help needed turning from your back to your side while in a flat bed without using bedrails?: A Lot Help needed moving from lying on your back to sitting on the side of a flat bed without using bedrails?: A Lot Help needed moving to and from a bed to a chair (including a wheelchair)?: A Lot Help needed standing up from a chair using your arms (e.g., wheelchair or bedside chair)?: Total Help needed to walk in hospital room?: Total Help needed climbing 3-5 steps with a railing? : Total 6 Click Score: 9    End of Session Equipment Utilized During Treatment: Right knee immobilizer;Other (comment) Activity Tolerance: Patient tolerated treatment well Patient left: in bed;with bed alarm set;with family/visitor present;with call bell/phone within reach Nurse Communication: Mobility status Pain - Right/Left: Right Pain - part of body: Leg     Time: 0100-7121 PT Time Calculation (min) (ACUTE ONLY): 41 min  Charges:  $Therapeutic Exercise: 8-22 mins $Therapeutic Activity: 23-37 mins                    Chesley Noon, PTA 01/13/21, 5:06 PM

## 2021-01-13 NOTE — Progress Notes (Signed)
Occupational Therapy Treatment Patient Details Name: Haley Kim MRN: [id number] DOB: [birth year] Today's Date: 01/13/2021    History of present illness 75 y.o. female COPD, dyslipidemia, dementia, who presented to the hospital after a fall at home.  Reportedly, there was no loss of consciousness.  In the ED, she was tachycardic and she was found to have atrial fibrillation with RVR.  Work-up revealed left humeral neck fracture, right medial tibial plateau fracture complicated by moderate-sized lipohemarthrosis, fracture of the distal left radius and minimally displaced left ulnar styloid fracture.   OT comments  Haley Kim continues to present with pain, decreased ROM, impaired cognition, generalized weakness, and weight bearing precautions that impacts her ability to safely and independently complete functional tasks.  Pt presents with short term memory impairments and decreased orientation to time/situation, but was able to follow one-step commands with 1-2 verbal cues.  OTR provided education to pt and family re: positioning of sling.  OTR provided moderate assist to attempt supine <> sit transfer (assist to move RLE and trunk), but pt unable to fully complete transfer as she reports increased pain.  Pt with increased HR during transfer attempt, so did not attempt additional transfers (HR from 100 to 115-120bpm).  Pt's HR quickly recovered with rest.  OTR provided setup assist for pt to complete grooming tasks with RUE at bedlevel.  Haley Kim will continue to benefit from skilled OT services in acute setting to address safety awareness, functional strengthening, ROM, and safety and independence in ADLs.  SNF remains most appropriate discharge recommendation.   Follow Up Recommendations  SNF    Equipment Recommendations  Other (comment) (defer to post acute setting)    Recommendations for Other Services      Precautions / Restrictions Precautions Precautions: Fall Required Braces  or Orthoses: Knee Immobilizer - Right;Sling;Other Brace Knee Immobilizer - Right: On at all times Other Brace: wrist brace  and sling on L UE NWB, KI on RLE Restrictions Weight Bearing Restrictions: Yes LUE Weight Bearing: Non weight bearing RLE Weight Bearing: Weight bearing as tolerated       Mobility Bed Mobility Overal bed mobility: Needs Assistance Bed Mobility: Supine to Sit;Sit to Supine     Supine to sit: Mod assist Sit to supine: Mod assist   General bed mobility comments: Provided mod assist for RLE and trunk to attempt supine <> sit, but pt requested to not complete transfer 2/2 pain (HR also elevated so did not push pt to continue with transfer)  Transfers Overall transfer level: Needs assistance               General transfer comment: deferred    Balance Overall balance assessment: Needs assistance                                         ADL either performed or assessed with clinical judgement   ADL Overall ADL's : Needs assistance/impaired Eating/Feeding: Minimal assistance;Bed level   Grooming: Wash/dry hands;Wash/dry face;Bed level;Minimal assistance Grooming Details (indicate cue type and reason): OTR provided setup assist for pt to wash face with R hand, pt's son reports providing min assist for brushing teeth at bedlevel earlier                               General ADL Comments: Moderate assist provided to attempt supine >  sit, but pt limited by pain and elevated HR     Vision Patient Visual Report: No change from baseline     Perception     Praxis      Cognition Arousal/Alertness: Awake/alert Behavior During Therapy: WFL for tasks assessed/performed Overall Cognitive Status: History of cognitive impairments - at baseline                                 General Comments: Pt with decreased short term memory, able to follow one-step commands with 1-2 verbal cues        Exercises Other  Exercises Other Exercises: provided education re: proper placement of sling, benefits of OOB mobility for improved strength and activity tolerance Other Exercises: provided setup assist for bedlevel grooming, moderate assist for supine <> sit   Shoulder Instructions       General Comments HR = 100bpm at rest, elevated to 115-120 with attempt to complete supine > sit (lowers quickly with rest)    Pertinent Vitals/ Pain       Pain Assessment: Faces Faces Pain Scale: Hurts little more Pain Location: L shoulder Pain Descriptors / Indicators: Discomfort;Grimacing;Guarding Pain Intervention(s): Limited activity within patient's tolerance;Monitored during session;Repositioned  Home Living                                          Prior Functioning/Environment              Frequency  Min 1X/week        Progress Toward Goals  OT Goals(current goals can now be found in the care plan section)  Progress towards OT goals: Progressing toward goals  Acute Rehab OT Goals Patient Stated Goal: husband to be able to take care of her OT Goal Formulation: With family Time For Goal Achievement: 01/22/21 Potential to Achieve Goals: Good  Plan Discharge plan remains appropriate;Frequency remains appropriate    Co-evaluation                 AM-PAC OT "6 Clicks" Daily Activity     Outcome Measure   Help from another person eating meals?: A Little Help from another person taking care of personal grooming?: A Little Help from another person toileting, which includes using toliet, bedpan, or urinal?: Total Help from another person bathing (including washing, rinsing, drying)?: A Lot Help from another person to put on and taking off regular upper body clothing?: A Lot Help from another person to put on and taking off regular lower body clothing?: A Lot 6 Click Score: 13    End of Session    OT Visit Diagnosis: Unsteadiness on feet (R26.81);Repeated falls  (R29.6);Muscle weakness (generalized) (M62.81)   Activity Tolerance Patient limited by pain;Treatment limited secondary to medical complications (Comment)   Patient Left in bed;with call bell/phone within reach;with bed alarm set;with family/visitor present   Nurse Communication          Time: 7622-6333 OT Time Calculation (min): 21 min  Charges: OT General Charges $OT Visit: 1 Visit OT Treatments $Self Care/Home Management : 8-22 mins  Myrtie Hawk Cieara Stierwalt, OTR/L 01/13/21, 3:04 PM

## 2021-01-13 NOTE — Progress Notes (Signed)
Progress Note    Haley Kim  QIO:[id number] DOB: 10-Dec-1945  DOA: 01/06/2021 PCP: Adin Hector, MD      Brief Narrative:   Haley Kim is a 75 y.o. female COPD, dyslipidemia, dementia, who presented to the hospital after a fall at home.  Reportedly, there was no loss of consciousness.  In the ED, she was tachycardic and she was found to have atrial fibrillation with RVR.  Work-up revealed left humeral neck fracture, right medial tibial plateau fracture complicated by moderate-sized lipohemarthrosis, fracture of the distal left radius and minimally displaced left ulnar styloid fracture. She was treated with IV Cardizem infusion for rapid A. fib.  She developed acute blood loss anemia requiring 2 units of packed red blood cells.  She was also treated with IV fluids and analgesics.  She was evaluated by the orthopedic surgeon who recommended conservative management. TRH admitted.  Patient continues to have poor ambulation status in the setting of multiple fractures, ultimate disposition SNF at this point awaiting acceptance and insurance approval.  She continues to have A. fib with RVR rebounding, cardiology following, appreciate insight and recommendations.  Appears to be somewhat difficult to control.  Once heart rate is better controlled and patient is able to better participate with therapy will likely discharge to SNF in the next few days again pending clinical course and heart rate.  Assessment/Plan:   Principal Problem:   Fall at home Active Problems:   Atrial fibrillation with RVR (HCC)   Left humeral fracture   Tibial plateau fracture, right   Acute blood loss anemia  S/p fall, left humeral neck fracture, right medial tibial plateau fracture, moderate sized lipohemarthrosis, distal left radius fracture, minimally displaced left ulnar styloid fracture: Conservative management recommended. Per ortho "Avoid any weightbearing through the left upper extremity or  shoulder". Continue analgesics as needed for pain.  Follow-up in orthopedic surgeon. PT and OT currently recommending SNF.    Atrial fibrillation with RVR, resolving:  Continues off Cardizem gtt Cardiology to continue to adjust rate control medications Currently on metoprolol 12.5 twice daily, as needed Lopressor IV; adding amiodarone 200mg  BID 01/13/21 Hold Eliquis; right knee lipohemarthrosis appears stable but anemia continues to downtrend  Acute blood loss anemia:  Hemoglobin improved s/p 2 units of PRBCs on 01/07/2021.  R knee lipohemarthrosis and likely somewhat dilutional.  Rebounded nearly 7 points with just 2 units at admission -concern for lab error with initial profound anemia. Minimally downtrending H/H over the past 72h - continue to hold eliquis   Hypotension:  Discontinue IV fluids to avoid fluid overload.  Monitor BP off of IV  fluids.  Severe protein caloric malnutrition  Body mass index is 13.14 kg/m.  (Underweight): This profoundly complicates overall prognosis.  Consult dietitian.  SIRS: In the setting of trauma. No evidence of infection at this time; does not meet sepsis criteria.  Antibiotics have been discontinued.  Hypomagnesemia, resolved  Dementia: Continue Aricept  Consultants:  Cardiologist  Orthopedic surgeon  Procedures:  None planned  Medications:   . sodium chloride   Intravenous Once  . sodium chloride   Intravenous Once  . acidophilus   Oral Daily  . amiodarone  200 mg Oral BID  . vitamin C  1,000 mg Oral Daily  . calcium carbonate  1,250 mg Oral Q breakfast  . cholecalciferol  2,000 Units Oral Daily  . donepezil  5 mg Oral QHS  . dronabinol  2.5 mg Oral QAC lunch  .  escitalopram  5 mg Oral Daily  . magnesium oxide  400 mg Oral Daily  . metoprolol tartrate  12.5 mg Oral BID  . multivitamin with minerals  1 tablet Oral Daily  . vitamin E  400 Units Oral Daily   Continuous Infusions:    Anti-infectives (From admission, onward)    Start     Dose/Rate Route Frequency Ordered Stop   01/08/21 1000  vancomycin (VANCOREADY) IVPB 500 mg/100 mL  Status:  Discontinued        500 mg 100 mL/hr over 60 Minutes Intravenous Every 36 hours 01/07/21 1217 01/07/21 1410   01/07/21 2100  ceFEPIme (MAXIPIME) 2 g in sodium chloride 0.9 % 100 mL IVPB  Status:  Discontinued        2 g 200 mL/hr over 30 Minutes Intravenous Every 24 hours 01/06/21 2041 01/07/21 1410   01/07/21 2100  vancomycin (VANCOREADY) IVPB 500 mg/100 mL  Status:  Discontinued        500 mg 100 mL/hr over 60 Minutes Intravenous Every 48 hours 01/06/21 2041 01/07/21 1217   01/06/21 2130  ceFEPIme (MAXIPIME) 2 g in sodium chloride 0.9 % 100 mL IVPB  Status:  Discontinued        2 g 200 mL/hr over 30 Minutes Intravenous  Once 01/06/21 2129 01/06/21 2148   01/06/21 2130  metroNIDAZOLE (FLAGYL) IVPB 500 mg  Status:  Discontinued        500 mg 100 mL/hr over 60 Minutes Intravenous Every 8 hours 01/06/21 2129 01/06/21 2148   01/06/21 2130  vancomycin (VANCOCIN) IVPB 1000 mg/200 mL premix  Status:  Discontinued        1,000 mg 200 mL/hr over 60 Minutes Intravenous  Once 01/06/21 2129 01/06/21 2149   01/06/21 2030  ceFEPIme (MAXIPIME) 2 g in sodium chloride 0.9 % 100 mL IVPB        2 g 200 mL/hr over 30 Minutes Intravenous  Once 01/06/21 2017 01/06/21 2250   01/06/21 2030  metroNIDAZOLE (FLAGYL) IVPB 500 mg  Status:  Discontinued        500 mg 100 mL/hr over 60 Minutes Intravenous Every 8 hours 01/06/21 2017 01/07/21 1410   01/06/21 2030  vancomycin (VANCOREADY) IVPB 750 mg/150 mL        750 mg 150 mL/hr over 60 Minutes Intravenous  Once 01/06/21 2017 01/07/21 0029      Family Communication/Anticipated D/C date and plan/Code Status   DVT prophylaxis: Eliquis on hold given downtrending anemia    Code Status: Full Code  Family Communication: Lengthy discussion with husband about disposition, prognosis and lengthy recovery in the setting of multiple fractures advanced  age and poor nutrition.  Disposition Plan:  Status is: Inpatient  Dispo: The patient is from: Home              Anticipated d/c is to: SNF              Anticipated d/c date is: 24-48h pending placement/approval              Patient currently is medically stable to d/c.   Difficult to place patient No  Subjective:   No acute issues or events overnight, remains in A. fib but much more rate controlled today.  Review of systems limited but patient denies any overt pain chest pain nausea vomiting or shortness of breath. She does endorse shoulder pain today - worse with ROM which we discussed she was limited on and should not move the arm  if it hurts her.  Objective:    Vitals:   01/13/21 0100 01/13/21 0416 01/13/21 0742 01/13/21 1107  BP:  123/80 130/84 (!) 143/100  Pulse:  (!) 104 64 94  Resp:  18 16 16   Temp:  98.1 F (36.7 C) 98.1 F (36.7 C) 98.3 F (36.8 C)  TempSrc:   Oral Oral  SpO2:  93% 94% 90%  Weight: 35.8 kg     Height:       No data found.   Intake/Output Summary (Last 24 hours) at 01/13/2021 1120 Last data filed at 01/12/2021 1815 Gross per 24 hour  Intake 240 ml  Output 2 ml  Net 238 ml   Filed Weights   01/11/21 0300 01/12/21 0349 01/13/21 0100  Weight: 35.4 kg 34.9 kg 35.8 kg    Exam:  GEN: NAD SKIN: Warm and dry EYES: No pallor or icterus ENT: MMM CV: Regular rate and rhythm PULM: CTA B ABD: soft, ND, NT, +BS CNS: Without overt deficit, alert to self only(baseline) EXT: Left arm in a sling.  Brace on right knee.   Data Reviewed:   I have personally reviewed following labs and imaging studies:  Labs: Labs show the following:   Basic Metabolic Panel: Recent Labs  Lab 01/07/21 0520 01/09/21 0627 01/10/21 0422 01/11/21 0415 01/12/21 0433  NA 144 141 141 138 138  K 3.8 4.7 4.5 5.0 3.8  CL 116* 109 107 103 104  CO2 22 25 26 29 27   GLUCOSE 146* 103* 125* 128* 100*  BUN 16 19 20 19 16   CREATININE 0.75 0.88 0.83 0.80 0.79  CALCIUM  6.7* 8.6* 8.7* 8.5* 8.0*  MG 1.6* 2.2  --   --   --    GFR Estimated Creatinine Clearance: 33.3 mL/min (by C-G formula based on SCr of 0.79 mg/dL). Liver Function Tests: Recent Labs  Lab 01/06/21 2041 01/10/21 0422 01/11/21 0415 01/12/21 0433  AST 23 34 28 22  ALT 21 22 21 17   ALKPHOS 23* 30* 31* 29*  BILITOT 1.1 2.0* 1.6* 1.5*  PROT 5.4* 5.6* 5.2* 4.7*  ALBUMIN 3.5 3.1* 2.9* 2.6*   No results for input(s): LIPASE, AMYLASE in the last 168 hours. No results for input(s): AMMONIA in the last 168 hours. Coagulation profile Recent Labs  Lab 01/06/21 2041  INR 1.2    CBC: Recent Labs  Lab 01/06/21 2041 01/07/21 0520 01/07/21 1019 01/07/21 2313 01/09/21 0627 01/10/21 0422 01/11/21 0415 01/12/21 0433  WBC 17.2* 13.9*  --   --  14.0* 11.0* 9.4 8.8  NEUTROABS 15.6*  --   --   --  12.3*  --   --   --   HGB 9.1* 7.1*   < > 12.3 12.9 12.0 11.1* 9.8*  HCT 27.5* 21.6*   < > 35.9* 37.4 34.7* 33.1* 29.8*  MCV 100.7* 102.4*  --   --  94.9 95.1 96.2 97.1  PLT 139* 103*  --   --  107* 122* 129* 149*   < > = values in this interval not displayed.   Cardiac Enzymes: No results for input(s): CKTOTAL, CKMB, CKMBINDEX, TROPONINI in the last 168 hours. BNP (last 3 results) No results for input(s): PROBNP in the last 8760 hours. CBG: No results for input(s): GLUCAP in the last 168 hours. D-Dimer: No results for input(s): DDIMER in the last 72 hours. Hgb A1c: No results for input(s): HGBA1C in the last 72 hours. Lipid Profile: No results for input(s): CHOL, HDL, LDLCALC, TRIG, CHOLHDL,  LDLDIRECT in the last 72 hours. Thyroid function studies: No results for input(s): TSH, T4TOTAL, T3FREE, THYROIDAB in the last 72 hours.  Invalid input(s): FREET3 Anemia work up: No results for input(s): VITAMINB12, FOLATE, FERRITIN, TIBC, IRON, RETICCTPCT in the last 72 hours. Sepsis Labs: Recent Labs  Lab 01/06/21 2041 01/07/21 0520 01/09/21 0627 01/10/21 0422 01/11/21 0415 01/12/21 0433   PROCALCITON 0.29  --   --   --   --   --   WBC 17.2* 13.9* 14.0* 11.0* 9.4 8.8  LATICACIDVEN 2.3* 1.8  --   --   --   --     Microbiology Recent Results (from the past 240 hour(s))  SARS Coronavirus 2 by RT PCR (hospital order, performed in [facility] hospital lab) Nasopharyngeal Nasopharyngeal Swab     Status: None   Collection Time: 01/06/21  8:41 PM   Specimen: Nasopharyngeal Swab  Result Value Ref Range Status   SARS Coronavirus 2 NEGATIVE NEGATIVE Final    Comment: (NOTE) SARS-CoV-2 target nucleic acids are NOT DETECTED.  The SARS-CoV-2 RNA is generally detectable in upper and lower respiratory specimens during the acute phase of infection. The lowest concentration of SARS-CoV-2 viral copies this assay can detect is 250 copies / mL. A negative result does not preclude SARS-CoV-2 infection and should not be used as the sole basis for treatment or other patient management decisions.  A negative result may occur with improper specimen collection / handling, submission of specimen other than nasopharyngeal swab, presence of viral mutation(s) within the areas targeted by this assay, and inadequate number of viral copies (<250 copies / mL). A negative result must be combined with clinical observations, patient history, and epidemiological information.  Fact Sheet for Patients:   StrictlyIdeas.no  Fact Sheet for Healthcare Providers: BankingDealers.co.za  This test is not yet approved or  cleared by the [place] FDA and has been authorized for detection and/or diagnosis of SARS-CoV-2 by FDA under an Emergency Use Authorization (EUA).  This EUA will remain in effect (meaning this test can be used) for the duration of the COVID-19 declaration under Section 564(b)(1) of the Act, 21 U.S.C. section 360bbb-3(b)(1), unless the authorization is terminated or revoked sooner.  Performed at [facility], [address]  [place], [place] [address]   Culture, blood (x 2)     Status: None   Collection Time: 01/06/21  8:41 PM   Specimen: BLOOD  Result Value Ref Range Status   Specimen Description BLOOD LEFT ANTECUBITAL  Final   Special Requests   Final    BOTTLES DRAWN AEROBIC AND ANAEROBIC Blood Culture adequate volume   Culture   Final    NO GROWTH 5 DAYS Performed at [facility], [address] [place], [place] [address]    Report Status 01/12/2021 FINAL  Final  Culture, blood (x 2)     Status: None   Collection Time: 01/06/21  8:41 PM   Specimen: BLOOD  Result Value Ref Range Status   Specimen Description BLOOD RIGHT ANTECUBITAL  Final   Special Requests   Final    BOTTLES DRAWN AEROBIC AND ANAEROBIC Blood Culture adequate volume   Culture   Final    NO GROWTH 5 DAYS Performed at [facility], [address] [place], [place] [address]    Report Status 01/12/2021 FINAL  Final    Procedures and diagnostic studies:  No results found.   LOS: 7 days   [facility]  Pager via secure chat  If 7PM-7AM, please contact night-coverage at [email or web address]  01/13/2021, 11:20 AM

## 2021-01-14 NOTE — Progress Notes (Signed)
[facility]    SUBJECTIVE: Haley Kim is a 75 year old female with a past medical history significant for COPD, hypercholesterolemia, and mixed Alzheimer's and vascular dementia who presented to the ED following an unwitnessed fall resulting in a right medical tibial plateau impacted fracture and a left shoulder nondisplaced left humeral neck fracture.  Upon arrival, she was noted to be in atrial fibrillation with RVR which warranted cardiac consultation.    01/13/21:  Haley Kim is currently lying in bed, in no acute distress.  She is somewhat of a poor historian due to underlying dementia, but denies any current chest pain or shortness of breath.    01/14/21: Continues to deny chest pain, chest pressure, palpitations, heart racing, shortness of breath, lower extremity swelling, orthopnea, or PND.    Vitals:   01/13/21 2203 01/13/21 2249 01/13/21 2338 01/14/21 0430  BP: 93/68  104/74 (!) 141/87  Pulse: 78 90 94 83  Resp: 16  16 15   Temp: 97.6 F (36.4 C)  98.4 F (36.9 C) 97.8 F (36.6 C)  TempSrc: Oral  Oral Oral  SpO2: 95%  93% 94%  Weight:    35.8 kg  Height:        No intake or output data in the 24 hours ending 01/14/21 0739    PHYSICAL EXAM  General: Thin, frail HEENT:  Normocephalic and atramatic Neck:  No JVD.  Lungs: Clear bilaterally to auscultation and percussion. Heart: Irregularly irregular, rapid ventricular response . Normal S1 and S2 without gallops or murmurs.  Abdomen: Bowel sounds are positive, abdomen soft and non-tender  Msk:  Back normal.  Normal strength and tone for age. Extremities: No clubbing, cyanosis or edema.   Neuro: Pleasantly confused  Psych:  Pleasantly confused    LABS: Basic Metabolic Panel: Recent Labs    01/12/21 0433  NA 138  K 3.8  CL 104  CO2 27  GLUCOSE 100*  BUN 16  CREATININE 0.79  CALCIUM 8.0*   Liver Function Tests: Recent Labs    01/12/21 0433  AST 22  ALT 17  ALKPHOS 29*  BILITOT 1.5*  PROT 4.7*   ALBUMIN 2.6*   No results for input(s): LIPASE, AMYLASE in the last 72 hours. CBC: Recent Labs    01/12/21 0433  WBC 8.8  HGB 9.8*  HCT 29.8*  MCV 97.1  PLT 149*   Cardiac Enzymes: No results for input(s): CKTOTAL, CKMB, CKMBINDEX, TROPONINI in the last 72 hours. BNP: Invalid input(s): POCBNP D-Dimer: No results for input(s): DDIMER in the last 72 hours. Hemoglobin A1C: No results for input(s): HGBA1C in the last 72 hours. Fasting Lipid Panel: No results for input(s): CHOL, HDL, LDLCALC, TRIG, CHOLHDL, LDLDIRECT in the last 72 hours. Thyroid Function Tests: No results for input(s): TSH, T4TOTAL, T3FREE, THYROIDAB in the last 72 hours.  Invalid input(s): FREET3 Anemia Panel: No results for input(s): VITAMINB12, FOLATE, FERRITIN, TIBC, IRON, RETICCTPCT in the last 72 hours.  No results found.   Echo: Normal RV systolic function and low normal LV systolic function with an EF estimated between 50-55% with no evidence of wall motion abnormalities or significant valvular abnormalities.    TELEMETRY: Atrial fibrillation, rate ranging from 90s-110   ASSESSMENT AND PLAN:  Principal Problem:   Fall at home Active Problems:   Atrial fibrillation with RVR (HCC)   Left humeral fracture   Tibial plateau fracture, right   Acute blood loss anemia    1.  Atrial fibrillation with RVR   -Rate  has been better controlled with addition of amiodarone; will continue with 200mg  BID while admitted and transition to 200mg  once daily upon discharge   -Continue metoprolol 12.5mg  BID   -Will continue to hold Eliquis due to anemia, fall risk   2.  Anemia   -Most recent hemoglobin/hematocrit 9.8/29.8; will continue to hold Eliquis at this time   3.  Hypercholesterolemia   -Agree to continue to hold statin due to underlying dementia   The history, physical exam findings, and plan of care were all discussed with Dr. Bartholome Bill, and all decision making was made in collaboration.    Avie Arenas  PA-C 01/14/2021 7:39 AM

## 2021-01-14 NOTE — Progress Notes (Signed)
Physical Therapy Treatment Patient Details Name: Haley Kim MRN: [id number] DOB: [birth year] Today's Date: 01/14/2021    History of Present Illness 75 y.o. female COPD, dyslipidemia, dementia, who presented to the hospital after a fall at home.  Reportedly, there was no loss of consciousness.  In the ED, she was tachycardic and she was found to have atrial fibrillation with RVR.  Work-up revealed left humeral neck fracture, right medial tibial plateau fracture complicated by moderate-sized lipohemarthrosis, fracture of the distal left radius and minimally displaced left ulnar styloid fracture.    PT Comments    Pt was long sitting in bed. Very pleasant throughout. Does have some cognition deficits at baseline however was able to follow commands consistently throughout session. She was able to exit L side of bed with min assist + increased time. Stood 2 x EOB prior to stand pivot towards L to recliner. Overall tolerated well but need max vcs and assist to maintain proper wt bearing restrictions in standing. Highly recommend DC to SNF to address deficits while assisting pt to PLOF.     Follow Up Recommendations  SNF;Supervision/Assistance - 24 hour     Equipment Recommendations  Other (comment) (defer to next level of care)    Recommendations for Other Services       Precautions / Restrictions Precautions Precautions: Fall Required Braces or Orthoses: Knee Immobilizer - Right;Sling;Other Brace Knee Immobilizer - Right: On at all times Other Brace: wrist brace  and sling on L UE NWB, KI on RLE Restrictions Weight Bearing Restrictions: Yes LUE Weight Bearing: Non weight bearing RLE Weight Bearing: Non weight bearing    Mobility  Bed Mobility Overal bed mobility: Needs Assistance Bed Mobility: Supine to Sit;Sit to Supine     Supine to sit: Min assist     General bed mobility comments: Pt's HR below 110bpm throughout. does have A fib. Minassist to achieve EOB short sit     Transfers Overall transfer level: Needs assistance Equipment used: Straight cane Transfers: Sit to/from Stand Sit to Stand: Mod assist;Max assist         General transfer comment: mod-max assist but was able to adhere to proper NWB  Ambulation/Gait      General Gait Details: unable due to wt bearing restrictions      Balance Overall balance assessment: Needs assistance Sitting-balance support: Feet supported Sitting balance-Leahy Scale: Good Sitting balance - Comments: supervision for sitting   Standing balance support: Single extremity supported;During functional activity Standing balance-Leahy Scale: Poor Standing balance comment: needs constant assist in standing         Cognition Arousal/Alertness: Awake/alert Behavior During Therapy: WFL for tasks assessed/performed Overall Cognitive Status: History of cognitive impairments - at baseline      General Comments: Pt is A and able to follow commands however does have baseline dementia             Pertinent Vitals/Pain Faces Pain Scale: Hurts a little bit Pain Location: L shoulder Pain Descriptors / Indicators: Discomfort;Grimacing;Guarding Pain Intervention(s): Limited activity within patient's tolerance;Monitored during session;Premedicated before session;Repositioned           PT Goals (current goals can now be found in the care plan section) Acute Rehab PT Goals Patient Stated Goal: none stated Progress towards PT goals: Progressing toward goals    Frequency    7X/week      PT Plan Current plan remains appropriate       AM-PAC PT "6 Clicks" Mobility   Outcome Measure  Help needed turning from your back to your side while in a flat bed without using bedrails?: A Lot Help needed moving from lying on your back to sitting on the side of a flat bed without using bedrails?: A Lot Help needed moving to and from a bed to a chair (including a wheelchair)?: A Lot Help needed standing up from a  chair using your arms (e.g., wheelchair or bedside chair)?: Total Help needed to walk in hospital room?: Total Help needed climbing 3-5 steps with a railing? : Total 6 Click Score: 9    End of Session Equipment Utilized During Treatment: Right knee immobilizer (hindged locked in ext) Activity Tolerance: Patient tolerated treatment well Patient left: in chair;with call bell/phone within reach;with chair alarm set Nurse Communication: Mobility status PT Visit Diagnosis: Unsteadiness on feet (R26.81);Other abnormalities of gait and mobility (R26.89);Repeated falls (R29.6);Muscle weakness (generalized) (M62.81);History of falling (Z91.81);Difficulty in walking, not elsewhere classified (R26.2);Pain Pain - Right/Left: Right Pain - part of body: Leg     Time: 1427-6701 PT Time Calculation (min) (ACUTE ONLY): 25 min  Charges:  $Therapeutic Activity: 23-37 mins                     Julaine Fusi PTA 01/14/21, 10:17 AM

## 2021-01-14 NOTE — Progress Notes (Signed)
Progress Note    Haley Kim  HKV:[id number] DOB: 24-Jun-1946  DOA: 01/06/2021 PCP: Adin Hector, MD      Brief Narrative:   JEN BENEDICT is a 75 y.o. female COPD, dyslipidemia, dementia, who presented to the hospital after a fall at home.  Reportedly, there was no loss of consciousness.  In the ED, she was tachycardic and she was found to have atrial fibrillation with RVR.  Work-up revealed left humeral neck fracture, right medial tibial plateau fracture complicated by moderate-sized lipohemarthrosis, fracture of the distal left radius and minimally displaced left ulnar styloid fracture. She was treated with IV Cardizem infusion for rapid A. fib.  She developed acute blood loss anemia requiring 2 units of packed red blood cells.  She was also treated with IV fluids and analgesics.  She was evaluated by the orthopedic surgeon who recommended conservative management. TRH admitted.  Patient continues to have poor ambulation status in the setting of multiple fractures, ultimate disposition SNF at this point awaiting acceptance and insurance approval.  She continues to have A. fib with RVR rebounding, cardiology following, appreciate insight and recommendations.  Appears to be somewhat difficult to control.  Once heart rate is better controlled and patient is able to better participate with therapy will likely discharge to SNF in the next few days again pending clinical course and heart rate.  Assessment/Plan:   Principal Problem:   Fall at home Active Problems:   Atrial fibrillation with RVR (HCC)   Left humeral fracture   Tibial plateau fracture, right   Acute blood loss anemia  S/p fall, left humeral neck fracture, right medial tibial plateau fracture, moderate sized lipohemarthrosis, distal left radius fracture, minimally displaced left ulnar styloid fracture: Conservative management recommended. Per ortho "Avoid any weightbearing through the left upper extremity or  shoulder". Continue analgesics as needed for pain.  Follow-up in orthopedic surgeon. PT and OT currently recommending SNF.    Atrial fibrillation with RVR, resolving:  Continues off Cardizem gtt Cardiology to continue to adjust rate control medications Currently on metoprolol 12.5 twice daily, amiodarone 200mg  BID PRN IV lopressor - last given on 2/10 Hold Eliquis; right knee lipohemarthrosis appears stable but anemia continues to downtrend  Acute blood loss anemia:  Hemoglobin improved s/p 2 units of PRBCs on 01/07/2021.  R knee lipohemarthrosis and likely somewhat dilutional.  Rebounded nearly 7 points with just 2 units at admission -concern for lab error with initial profound anemia. Minimally downtrending H/H over the past week - continue to hold eliquis  Labs q72h  Hypotension:  Discontinue IV fluids to avoid fluid overload.  Monitor BP off of IV  fluids.  Severe protein caloric malnutrition  Body mass index is 13.14 kg/m.  (Underweight): This profoundly complicates overall prognosis.  Consult dietitian.  SIRS: In the setting of trauma. No evidence of infection at this time; does not meet sepsis criteria.  Antibiotics have been discontinued.  Hypomagnesemia, resolved  Dementia: Continue Aricept  Consultants:  Cardiologist  Orthopedic surgeon  Procedures:  None planned  Medications:   . sodium chloride   Intravenous Once  . sodium chloride   Intravenous Once  . acidophilus   Oral Daily  . amiodarone  200 mg Oral BID  . vitamin C  1,000 mg Oral Daily  . calcium carbonate  1,250 mg Oral Q breakfast  . cholecalciferol  2,000 Units Oral Daily  . donepezil  5 mg Oral QHS  . dronabinol  2.5 mg  Oral QAC lunch  . escitalopram  5 mg Oral Daily  . magnesium oxide  400 mg Oral Daily  . metoprolol tartrate  12.5 mg Oral BID  . multivitamin with minerals  1 tablet Oral Daily  . vitamin E  400 Units Oral Daily   Continuous Infusions:    Anti-infectives (From  admission, onward)   Start     Dose/Rate Route Frequency Ordered Stop   01/08/21 1000  vancomycin (VANCOREADY) IVPB 500 mg/100 mL  Status:  Discontinued        500 mg 100 mL/hr over 60 Minutes Intravenous Every 36 hours 01/07/21 1217 01/07/21 1410   01/07/21 2100  ceFEPIme (MAXIPIME) 2 g in sodium chloride 0.9 % 100 mL IVPB  Status:  Discontinued        2 g 200 mL/hr over 30 Minutes Intravenous Every 24 hours 01/06/21 2041 01/07/21 1410   01/07/21 2100  vancomycin (VANCOREADY) IVPB 500 mg/100 mL  Status:  Discontinued        500 mg 100 mL/hr over 60 Minutes Intravenous Every 48 hours 01/06/21 2041 01/07/21 1217   01/06/21 2130  ceFEPIme (MAXIPIME) 2 g in sodium chloride 0.9 % 100 mL IVPB  Status:  Discontinued        2 g 200 mL/hr over 30 Minutes Intravenous  Once 01/06/21 2129 01/06/21 2148   01/06/21 2130  metroNIDAZOLE (FLAGYL) IVPB 500 mg  Status:  Discontinued        500 mg 100 mL/hr over 60 Minutes Intravenous Every 8 hours 01/06/21 2129 01/06/21 2148   01/06/21 2130  vancomycin (VANCOCIN) IVPB 1000 mg/200 mL premix  Status:  Discontinued        1,000 mg 200 mL/hr over 60 Minutes Intravenous  Once 01/06/21 2129 01/06/21 2149   01/06/21 2030  ceFEPIme (MAXIPIME) 2 g in sodium chloride 0.9 % 100 mL IVPB        2 g 200 mL/hr over 30 Minutes Intravenous  Once 01/06/21 2017 01/06/21 2250   01/06/21 2030  metroNIDAZOLE (FLAGYL) IVPB 500 mg  Status:  Discontinued        500 mg 100 mL/hr over 60 Minutes Intravenous Every 8 hours 01/06/21 2017 01/07/21 1410   01/06/21 2030  vancomycin (VANCOREADY) IVPB 750 mg/150 mL        750 mg 150 mL/hr over 60 Minutes Intravenous  Once 01/06/21 2017 01/07/21 0029      Family Communication/Anticipated D/C date and plan/Code Status   DVT prophylaxis: Eliquis on hold given downtrending anemia    Code Status: Full Code  Family Communication: Lengthy discussion with husband about disposition, prognosis and lengthy recovery in the setting of multiple  fractures advanced age and poor nutrition.  Disposition Plan:  Status is: Inpatient  Dispo: The patient is from: Home              Anticipated d/c is to: SNF              Anticipated d/c date is: 24-48h pending placement/approval              Patient currently is medically stable to d/c.   Difficult to place patient No  Subjective:   No acute issues or events overnight, remains in A. fib but much more rate controlled today.  Review of systems limited but patient denies any overt pain chest pain nausea vomiting or shortness of breath. She does not endorse shoulder pain today which was her main complaint yesterday.  Objective:  Vitals:   01/13/21 2203 01/13/21 2249 01/13/21 2338 01/14/21 0430  BP: 93/68  104/74 (!) 141/87  Pulse: 78 90 94 83  Resp: 16  16 15   Temp: 97.6 F (36.4 C)  98.4 F (36.9 C) 97.8 F (36.6 C)  TempSrc: Oral  Oral Oral  SpO2: 95%  93% 94%  Weight:    35.8 kg  Height:       No data found.  No intake or output data in the 24 hours ending 01/14/21 0814 Filed Weights   01/12/21 0349 01/13/21 0100 01/14/21 0430  Weight: 34.9 kg 35.8 kg 35.8 kg    Exam:  GEN: NAD SKIN: Warm and dry EYES: No pallor or icterus ENT: MMM CV: Regular rate and rhythm PULM: CTA B ABD: soft, ND, NT, +BS CNS: Without overt deficit, alert to self only(baseline) EXT: Left arm in a sling.  Brace on right knee.   Data Reviewed:   I have personally reviewed following labs and imaging studies:  Labs: Labs show the following:   Basic Metabolic Panel: Recent Labs  Lab 01/09/21 0627 01/10/21 0422 01/11/21 0415 01/12/21 0433  NA 141 141 138 138  K 4.7 4.5 5.0 3.8  CL 109 107 103 104  CO2 25 26 29 27   GLUCOSE 103* 125* 128* 100*  BUN 19 20 19 16   CREATININE 0.88 0.83 0.80 0.79  CALCIUM 8.6* 8.7* 8.5* 8.0*  MG 2.2  --   --   --    GFR Estimated Creatinine Clearance: 33.3 mL/min (by C-G formula based on SCr of 0.79 mg/dL). Liver Function Tests: Recent Labs   Lab 01/10/21 0422 01/11/21 0415 01/12/21 0433  AST 34 28 22  ALT 22 21 17   ALKPHOS 30* 31* 29*  BILITOT 2.0* 1.6* 1.5*  PROT 5.6* 5.2* 4.7*  ALBUMIN 3.1* 2.9* 2.6*   No results for input(s): LIPASE, AMYLASE in the last 168 hours. No results for input(s): AMMONIA in the last 168 hours. Coagulation profile No results for input(s): INR, PROTIME in the last 168 hours.  CBC: Recent Labs  Lab 01/07/21 2313 01/09/21 0627 01/10/21 0422 01/11/21 0415 01/12/21 0433  WBC  --  14.0* 11.0* 9.4 8.8  NEUTROABS  --  12.3*  --   --   --   HGB 12.3 12.9 12.0 11.1* 9.8*  HCT 35.9* 37.4 34.7* 33.1* 29.8*  MCV  --  94.9 95.1 96.2 97.1  PLT  --  107* 122* 129* 149*   Cardiac Enzymes: No results for input(s): CKTOTAL, CKMB, CKMBINDEX, TROPONINI in the last 168 hours. BNP (last 3 results) No results for input(s): PROBNP in the last 8760 hours. CBG: No results for input(s): GLUCAP in the last 168 hours. D-Dimer: No results for input(s): DDIMER in the last 72 hours. Hgb A1c: No results for input(s): HGBA1C in the last 72 hours. Lipid Profile: No results for input(s): CHOL, HDL, LDLCALC, TRIG, CHOLHDL, LDLDIRECT in the last 72 hours. Thyroid function studies: No results for input(s): TSH, T4TOTAL, T3FREE, THYROIDAB in the last 72 hours.  Invalid input(s): FREET3 Anemia work up: No results for input(s): VITAMINB12, FOLATE, FERRITIN, TIBC, IRON, RETICCTPCT in the last 72 hours. Sepsis Labs: Recent Labs  Lab 01/09/21 0627 01/10/21 0422 01/11/21 0415 01/12/21 0433  WBC 14.0* 11.0* 9.4 8.8    Microbiology Recent Results (from the past 240 hour(s))  SARS Coronavirus 2 by RT PCR (hospital order, performed in [facility] hospital lab) Nasopharyngeal Nasopharyngeal Swab     Status: None  Collection Time: 01/06/21  8:41 PM   Specimen: Nasopharyngeal Swab  Result Value Ref Range Status   SARS Coronavirus 2 NEGATIVE NEGATIVE Final    Comment: (NOTE) SARS-CoV-2 target nucleic acids are  NOT DETECTED.  The SARS-CoV-2 RNA is generally detectable in upper and lower respiratory specimens during the acute phase of infection. The lowest concentration of SARS-CoV-2 viral copies this assay can detect is 250 copies / mL. A negative result does not preclude SARS-CoV-2 infection and should not be used as the sole basis for treatment or other patient management decisions.  A negative result may occur with improper specimen collection / handling, submission of specimen other than nasopharyngeal swab, presence of viral mutation(s) within the areas targeted by this assay, and inadequate number of viral copies (<250 copies / mL). A negative result must be combined with clinical observations, patient history, and epidemiological information.  Fact Sheet for Patients:   StrictlyIdeas.no  Fact Sheet for Healthcare Providers: BankingDealers.co.za  This test is not yet approved or  cleared by the [place] FDA and has been authorized for detection and/or diagnosis of SARS-CoV-2 by FDA under an Emergency Use Authorization (EUA).  This EUA will remain in effect (meaning this test can be used) for the duration of the COVID-19 declaration under Section 564(b)(1) of the Act, 21 U.S.C. section 360bbb-3(b)(1), unless the authorization is terminated or revoked sooner.  Performed at [facility], [address] [place], [place] [address]   Culture, blood (x 2)     Status: None   Collection Time: 01/06/21  8:41 PM   Specimen: BLOOD  Result Value Ref Range Status   Specimen Description BLOOD LEFT ANTECUBITAL  Final   Special Requests   Final    BOTTLES DRAWN AEROBIC AND ANAEROBIC Blood Culture adequate volume   Culture   Final    NO GROWTH 5 DAYS Performed at [facility], [address], [place], [place] [address]    Report Status 01/12/2021 FINAL  Final  Culture, blood (x 2)     Status: None   Collection Time:  01/06/21  8:41 PM   Specimen: BLOOD  Result Value Ref Range Status   Specimen Description BLOOD RIGHT ANTECUBITAL  Final   Special Requests   Final    BOTTLES DRAWN AEROBIC AND ANAEROBIC Blood Culture adequate volume   Culture   Final    NO GROWTH 5 DAYS Performed at [facility], [address], [place],  [address]    Report Status 01/12/2021 FINAL  Final    Procedures and diagnostic studies:  No results found.   LOS: 8 days   [facility]   Pager via secure chat  If 7PM-7AM, please contact night-coverage at [email or web address]  01/14/2021, 8:14 AM

## 2021-01-15 DIAGNOSIS — E43 Unspecified severe protein-calorie malnutrition: Secondary | ICD-10-CM

## 2021-01-15 DIAGNOSIS — S42202A Unspecified fracture of upper end of left humerus, initial encounter for closed fracture: Secondary | ICD-10-CM

## 2021-01-15 DIAGNOSIS — S82141A Displaced bicondylar fracture of right tibia, initial encounter for closed fracture: Secondary | ICD-10-CM

## 2021-01-15 DIAGNOSIS — M25532 Pain in left wrist: Secondary | ICD-10-CM

## 2021-01-15 LAB — BASIC METABOLIC PANEL
Anion gap: 9 (ref 5–15)
BUN: 16 mg/dL (ref 8–23)
CO2: 30 mmol/L (ref 22–32)
Calcium: 8.3 mg/dL — ABNORMAL LOW (ref 8.9–10.3)
Chloride: 102 mmol/L (ref 98–111)
Creatinine, Ser: 0.7 mg/dL (ref 0.44–1.00)
GFR, Estimated: >60 mL/min (ref >60)
Glucose, Bld: 97 mg/dL (ref 70–99)
Potassium: 3.9 mmol/L (ref 3.5–5.1)
Sodium: 141 mmol/L (ref 135–145)

## 2021-01-15 LAB — CBC
HCT: 32.8 % — ABNORMAL LOW (ref 36.0–46.0)
Hemoglobin: 10.6 g/dL — ABNORMAL LOW (ref 12.0–15.0)
MCH: 32.2 pg (ref 26.0–34.0)
MCHC: 32.3 g/dL (ref 30.0–36.0)
MCV: 99.7 fL (ref 80.0–100.0)
Platelets: 260 10*3/uL (ref 150–400)
RBC: 3.29 MIL/uL — ABNORMAL LOW (ref 3.87–5.11)
RDW: 14.8 % (ref 11.5–15.5)
WBC: 7.1 10*3/uL (ref 4.0–10.5)
nRBC: 0 % (ref 0.0–0.2)

## 2021-01-15 LAB — MAGNESIUM: Magnesium: 2.3 mg/dL (ref 1.7–2.4)

## 2021-01-15 MED ORDER — MEGESTROL ACETATE 20 MG PO TABS
160.0000 mg | ORAL_TABLET | Freq: Two times a day (BID) | ORAL | Status: DC
  Administered 2021-01-15 – 2021-01-16 (×2): 160 mg via ORAL
  Filled 2021-01-15 (×5): qty 8

## 2021-01-15 NOTE — Progress Notes (Signed)
Physical Therapy Treatment Patient Details Name: Haley Kim MRN: [id number] DOB: [birth year] Today's Date: 01/15/2021    History of Present Illness 75 y.o. female COPD, dyslipidemia, dementia, who presented to the hospital after a fall at home.  Reportedly, there was no loss of consciousness.  In the ED, she was tachycardic and she was found to have atrial fibrillation with RVR.  Work-up revealed left humeral neck fracture, right medial tibial plateau fracture complicated by moderate-sized lipohemarthrosis, fracture of the distal left radius and minimally displaced left ulnar styloid fracture.    PT Comments    Pt remains pleasantly confused, husband at bedside.  Great tolerance however for there ex and mobility this pm. Pt required ModA to transition to EOB.  Good unsupported sitting balance x 10 minutes, L UE sling adjusted for better support.  Pt able to follow simple commands.  Sit to stand from raised bed with MinA and CG/MinA to maintain stance for one minute increments. Pt able to maintain NWB R LE with KI on without c/o pain. Pt assisted back to bed upon session completion.  Bed in low position with alarm, rails up, call bell in reach, and husband at bedside.   Follow Up Recommendations        Equipment Recommendations       Recommendations for Other Services       Precautions / Restrictions Precautions Precautions: Fall Required Braces or Orthoses: Knee Immobilizer - Right;Sling;Other Brace Knee Immobilizer - Right: On at all times Other Brace: wrist brace  and sling on L UE NWB, KI on RLE Restrictions Weight Bearing Restrictions: Yes LUE Weight Bearing: Non weight bearing RLE Weight Bearing: Non weight bearing    Mobility  Bed Mobility Overal bed mobility: Needs Assistance Bed Mobility: Supine to Sit;Sit to Supine     Supine to sit: Min assist Sit to supine: HOB elevated;Mod assist   General bed mobility comments: Good upright posture sitting unsupported  EOB while L UE sling adjusted    Transfers Overall transfer level: Needs assistance   Transfers: Sit to/from Stand Sit to Stand: Mod assist;From elevated surface         General transfer comment: Pt able to tolerate static standing NWB R LE 1 minute x 3 with MinA to maintain. Good upright posture and compliance with restrictions.  Ambulation/Gait             General Gait Details: unable due to wt bearing restrictions   Stairs             Wheelchair Mobility    Modified Rankin (Stroke Patients Only)       Balance                                            Cognition Arousal/Alertness: Awake/alert Behavior During Therapy: WFL for tasks assessed/performed Overall Cognitive Status: History of cognitive impairments - at baseline                                 General Comments: Pt is A and able to follow commands however does have baseline dementia      Exercises Total Joint Exercises Ankle Circles/Pumps: AROM;Both;20 reps Heel Slides: AROM;Left;10 reps Hip ABduction/ADduction: AROM;Both;10 reps    General Comments        Pertinent Vitals/Pain Pain Assessment: Faces  Faces Pain Scale: Hurts a little bit Pain Location: L shoulder Pain Descriptors / Indicators: Discomfort;Grimacing;Guarding Pain Intervention(s): Monitored during session;Patient requesting pain meds-RN notified    Home Living                      Prior Function            PT Goals (current goals can now be found in the care plan section) Acute Rehab PT Goals Patient Stated Goal: none stated    Frequency    7X/week      PT Plan Current plan remains appropriate    Co-evaluation              AM-PAC PT "6 Clicks" Mobility   Outcome Measure  Help needed turning from your back to your side while in a flat bed without using bedrails?: A Lot Help needed moving from lying on your back to sitting on the side of a flat bed without  using bedrails?: A Lot Help needed moving to and from a bed to a chair (including a wheelchair)?: A Lot Help needed standing up from a chair using your arms (e.g., wheelchair or bedside chair)?: Total Help needed to walk in hospital room?: Total Help needed climbing 3-5 steps with a railing? : Total 6 Click Score: 9    End of Session Equipment Utilized During Treatment: Right knee immobilizer Activity Tolerance: Patient tolerated treatment well Patient left: in chair;with call bell/phone within reach;with chair alarm set Nurse Communication: Mobility status PT Visit Diagnosis: Unsteadiness on feet (R26.81);Other abnormalities of gait and mobility (R26.89);Repeated falls (R29.6);Muscle weakness (generalized) (M62.81);History of falling (Z91.81);Difficulty in walking, not elsewhere classified (R26.2);Pain Pain - Right/Left: Right Pain - part of body: Leg     Time: 1430-1510 PT Time Calculation (min) (ACUTE ONLY): 40 min  Charges:  $Therapeutic Activity: 38-52 mins                     Mikel Cella, PTA  Josie Dixon 01/15/2021, 3:40 PM

## 2021-01-15 NOTE — TOC Progression Note (Signed)
Transition of Care [facility]) - Progression Note    Patient Details  Name: Haley Kim MRN: [id number] Date of Birth: Dec 24, 1945  [facility] CM/SW Contact  Beverly Sessions, RN Phone Number: 01/15/2021, 3:29 PM  Clinical Narrative:     Candace Cruise id [id number] valid through 2/21        Expected Discharge Plan and Services                                                 Social Determinants of Health (SDOH) Interventions    Readmission Risk Interventions No flowsheet data found.

## 2021-01-15 NOTE — Progress Notes (Signed)
PROGRESS NOTE    Haley Kim  MPN:[id number] DOB: [birth year] DOA: 01/06/2021 PCP: Adin Hector, MD     Brief Narrative:  Haley Kim is a 74 y.o. WF PMHx COPD, dyslipidemia, dementia,   Presented to the hospital after a fall at home.  Reportedly, there was no loss of consciousness.  In the ED, she was tachycardic and she was found to have atrial fibrillation with RVR.  Work-up revealed left humeral neck fracture, right medial tibial plateau fracture complicated by moderate-sized lipohemarthrosis, fracture of the distal left radius and minimally displaced left ulnar styloid fracture. She was treated with IV Cardizem infusion for rapid A. fib.  She developed acute blood loss anemia requiring 2 units of packed red blood cells.  She was also treated with IV fluids and analgesics.  She was evaluated by the orthopedic surgeon who recommended conservative management. TRH admitted.  Patient continues to have poor ambulation status in the setting of multiple fractures, ultimate disposition SNF at this point awaiting acceptance and insurance approval.  She continues to have A. fib with RVR rebounding, cardiology following, appreciate insight and recommendations.  Appears to be somewhat difficult to control.  Once heart rate is better controlled and patient is able to better participate with therapy will likely discharge to SNF in the next few days again pending clinical course and heart rate.    Subjective: Afebrile overnight, A. fib rate controlled.  A/O x1 (does not know where, when, why).  Does know that she is in a hospital.  Pleasant, cooperative.   Assessment & Plan: Covid vaccination;   Principal Problem:   Fall at home Active Problems:   Atrial fibrillation with RVR (HCC)   Left humeral fracture   Tibial plateau fracture, right   Acute blood loss anemia   S/p fall, left humeral neck fracture, right medial tibial plateau fracture, moderate sized lipohemarthrosis,  distal left radius fracture, minimally displaced left ulnar styloid fracture:  -Conservative management recommended. Per ortho "Avoid any weightbearing through the left upper extremity or shoulder". -Continue analgesics as needed for pain.   -Follow-up in orthopedic surgeon.  -Awaiting SNF placement     Atrial fibrillation with RVR  -off Cardizem gtt; resolved -Cardiology consulted -On 200 mg BID -Metoprolol 12.5 mg BID -Hold Eliquis; patient fall risk.  See acute blood loss anemia.  Discussed with husband   Acute blood loss anemia/RIGHT knee lipohemarthrosis:  -2/8 transfuse 2 units PRBC  - continue to hold eliquis  Lab Results  Component Value Date   HGB 10.6 (L) 01/15/2021   HGB 9.8 (L) 01/12/2021   HGB 11.1 (L) 01/11/2021   HGB 12.0 01/10/2021   HGB 12.9 01/09/2021  -Hemoglobin trending up  Hypotension:  -Discontinue IV fluids to avoid fluid overload.  Monitor BP off of IV  Fluids. -We will transfuse patient with albumin given her malnutrition if required.  Severe protein caloric malnutrition  -Body mass index is 13.14 kg/m.  (Underweight): This profoundly complicates overall prognosis.  Consult dietitian. -2/16 Megace 160 mg BID  SIRS:  -In the setting of trauma. No evidence of infection at this time; does not meet sepsis criteria.  -Antibiotics have been discontinued.  Hypomagnesemia, -Resolved  Dementia:  -Aricept 5 mg qhs -Husband counseled that medication would not reverse dementia at best may slow deterioration.    DVT prophylaxis: Eliquis (hold) given patient's fall discussed with husband that I recommend not starting even at discharge given multiple fractures and propensity for fall. Code Status: Full  Family Communication: 2/16 husband at bedside for discussion of plan of care answered all questions Status is: Inpatient    Dispo: The patient is from: Home              Anticipated d/c is to: SNF              Anticipated d/c date is: 2/17               Patient currently unstable      Consultants:  Cardiology Orthopedic surgery  Procedures/Significant Events:    I have personally reviewed and interpreted all radiology studies and my findings are as above.  VENTILATOR SETTINGS:    Cultures   Antimicrobials:    Devices    LINES / TUBES:     Continuous Infusions:   Objective: Vitals:   01/14/21 1946 01/14/21 2003 01/15/21 0505 01/15/21 0828  BP: 118/66 116/64 127/73 128/71  Pulse: 82 85 73 78  Resp: 18 20 16 16   Temp: 97.7 F (36.5 C) 98.3 F (36.8 C) 97.8 F (36.6 C) 98.9 F (37.2 C)  TempSrc: Oral Oral Oral   SpO2: 93% 94% 96% 91%  Weight:      Height:        Intake/Output Summary (Last 24 hours) at 01/15/2021 5053 Last data filed at 01/14/2021 1355 Gross per 24 hour  Intake 240 ml  Output 0 ml  Net 240 ml   Filed Weights   01/12/21 0349 01/13/21 0100 01/14/21 0430  Weight: 34.9 kg 35.8 kg 35.8 kg    Examination:  General: A/O x1 (does not know where, when, why) patient does know that she is in a hospital.  No acute respiratory distress, cachectic Eyes: negative scleral hemorrhage, negative anisocoria, negative icterus ENT: Negative Runny nose, negative gingival bleeding, Neck:  Negative scars, masses, torticollis, lymphadenopathy, JVD Lungs: Clear to auscultation bilaterally without wheezes or crackles Cardiovascular: Regular rate and rhythm without murmur gallop or rub normal S1 and S2 Abdomen: negative abdominal pain, nondistended, positive soft, bowel sounds, no rebound, no ascites, no appreciable mass EXTREMITIES: left upper extremity in a sling with swelling lateral condyle, right lower extremity and soft removable cast.  No complaint of pain Skin: Negative rashes, lesions, ulcers Psychiatric:  Negative depression, negative anxiety, negative fatigue, negative mania  Central nervous system:  Cranial nerves II through XII intact, tongue/uvula midline, all extremities muscle  strength 5/5, sensation intact throughout,  negative dysarthria, negative expressive aphasia, negative receptive aphasia.  .     Data Reviewed: Care during the described time interval was provided by me .  I have reviewed this patient's available data, including medical history, events of note, physical examination, and all test results as part of my evaluation.  CBC: Recent Labs  Lab 01/09/21 0627 01/10/21 0422 01/11/21 0415 01/12/21 0433 01/15/21 0549  WBC 14.0* 11.0* 9.4 8.8 7.1  NEUTROABS 12.3*  --   --   --   --   HGB 12.9 12.0 11.1* 9.8* 10.6*  HCT 37.4 34.7* 33.1* 29.8* 32.8*  MCV 94.9 95.1 96.2 97.1 99.7  PLT 107* 122* 129* 149* 976   Basic Metabolic Panel: Recent Labs  Lab 01/09/21 0627 01/10/21 0422 01/11/21 0415 01/12/21 0433 01/15/21 0549  NA 141 141 138 138 141  K 4.7 4.5 5.0 3.8 3.9  CL 109 107 103 104 102  CO2 25 26 29 27 30   GLUCOSE 103* 125* 128* 100* 97  BUN 19 20 19 16 16   CREATININE 0.88 0.83 0.80  0.79 0.70  CALCIUM 8.6* 8.7* 8.5* 8.0* 8.3*  MG 2.2  --   --   --  2.3   GFR: Estimated Creatinine Clearance: 33.3 mL/min (by C-G formula based on SCr of 0.7 mg/dL). Liver Function Tests: Recent Labs  Lab 01/10/21 0422 01/11/21 0415 01/12/21 0433  AST 34 28 22  ALT 22 21 17   ALKPHOS 30* 31* 29*  BILITOT 2.0* 1.6* 1.5*  PROT 5.6* 5.2* 4.7*  ALBUMIN 3.1* 2.9* 2.6*   No results for input(s): LIPASE, AMYLASE in the last 168 hours. No results for input(s): AMMONIA in the last 168 hours. Coagulation Profile: No results for input(s): INR, PROTIME in the last 168 hours. Cardiac Enzymes: No results for input(s): CKTOTAL, CKMB, CKMBINDEX, TROPONINI in the last 168 hours. BNP (last 3 results) No results for input(s): PROBNP in the last 8760 hours. HbA1C: No results for input(s): HGBA1C in the last 72 hours. CBG: No results for input(s): GLUCAP in the last 168 hours. Lipid Profile: No results for input(s): CHOL, HDL, LDLCALC, TRIG, CHOLHDL,  LDLDIRECT in the last 72 hours. Thyroid Function Tests: No results for input(s): TSH, T4TOTAL, FREET4, T3FREE, THYROIDAB in the last 72 hours. Anemia Panel: No results for input(s): VITAMINB12, FOLATE, FERRITIN, TIBC, IRON, RETICCTPCT in the last 72 hours. Sepsis Labs: No results for input(s): PROCALCITON, LATICACIDVEN in the last 168 hours.  Recent Results (from the past 240 hour(s))  SARS Coronavirus 2 by RT PCR (hospital order, performed in [facility] hospital lab) Nasopharyngeal Nasopharyngeal Swab     Status: None   Collection Time: 01/06/21  8:41 PM   Specimen: Nasopharyngeal Swab  Result Value Ref Range Status   SARS Coronavirus 2 NEGATIVE NEGATIVE Final    Comment: (NOTE) SARS-CoV-2 target nucleic acids are NOT DETECTED.  The SARS-CoV-2 RNA is generally detectable in upper and lower respiratory specimens during the acute phase of infection. The lowest concentration of SARS-CoV-2 viral copies this assay can detect is 250 copies / mL. A negative result does not preclude SARS-CoV-2 infection and should not be used as the sole basis for treatment or other patient management decisions.  A negative result may occur with improper specimen collection / handling, submission of specimen other than nasopharyngeal swab, presence of viral mutation(s) within the areas targeted by this assay, and inadequate number of viral copies (<250 copies / mL). A negative result must be combined with clinical observations, patient history, and epidemiological information.  Fact Sheet for Patients:   StrictlyIdeas.no  Fact Sheet for Healthcare Providers: BankingDealers.co.za  This test is not yet approved or  cleared by the [place] FDA and has been authorized for detection and/or diagnosis of SARS-CoV-2 by FDA under an Emergency Use Authorization (EUA).  This EUA will remain in effect (meaning this test can be used) for the duration of  the COVID-19 declaration under Section 564(b)(1) of the Act, 21 U.S.C. section 360bbb-3(b)(1), unless the authorization is terminated or revoked sooner.  Performed at [facility], [address] [place], [place] [address]   Culture, blood (x 2)     Status: None   Collection Time: 01/06/21  8:41 PM   Specimen: BLOOD  Result Value Ref Range Status   Specimen Description BLOOD LEFT ANTECUBITAL  Final   Special Requests   Final    BOTTLES DRAWN AEROBIC AND ANAEROBIC Blood Culture adequate volume   Culture   Final    NO GROWTH 5 DAYS Performed at [facility], [place], [place]  [address]    Report Status 01/12/2021 FINAL  Final  Culture, blood (x 2)     Status: None   Collection Time: 01/06/21  8:41 PM   Specimen: BLOOD  Result Value Ref Range Status   Specimen Description BLOOD RIGHT ANTECUBITAL  Final   Special Requests   Final    BOTTLES DRAWN AEROBIC AND ANAEROBIC Blood Culture adequate volume   Culture   Final    NO GROWTH 5 DAYS Performed at [facility], [address] [place], [place] [address]    Report Status 01/12/2021 FINAL  Final         Radiology Studies: No results found.      Scheduled Meds: . sodium chloride   Intravenous Once  . sodium chloride   Intravenous Once  . acidophilus   Oral Daily  . amiodarone  200 mg Oral BID  . vitamin C  1,000 mg Oral Daily  . calcium carbonate  1,250 mg Oral Q breakfast  . cholecalciferol  2,000 Units Oral Daily  . donepezil  5 mg Oral QHS  . dronabinol  2.5 mg Oral QAC lunch  . escitalopram  5 mg Oral Daily  . magnesium oxide  400 mg Oral Daily  . metoprolol tartrate  12.5 mg Oral BID  . multivitamin with minerals  1 tablet Oral Daily  . vitamin E  400 Units Oral Daily   Continuous Infusions:   LOS: 9 days    Time spent:40 min    Delbert Vu, Geraldo Docker, MD [facility]   If 7PM-7AM, please contact night-coverage 01/15/2021, 9:51 AM

## 2021-01-16 LAB — CBC
HCT: 30.8 % — ABNORMAL LOW (ref 36.0–46.0)
Hemoglobin: 10 g/dL — ABNORMAL LOW (ref 12.0–15.0)
MCH: 32.5 pg (ref 26.0–34.0)
MCHC: 32.5 g/dL (ref 30.0–36.0)
MCV: 100 fL (ref 80.0–100.0)
Platelets: 258 10*3/uL (ref 150–400)
RBC: 3.08 MIL/uL — ABNORMAL LOW (ref 3.87–5.11)
RDW: 15 % (ref 11.5–15.5)
WBC: 4.8 10*3/uL (ref 4.0–10.5)
nRBC: 0 % (ref 0.0–0.2)

## 2021-01-16 LAB — RESP PANEL BY RT-PCR (FLU A&B, COVID) ARPGX2
Influenza A by PCR: NEGATIVE
Influenza B by PCR: NEGATIVE
SARS Coronavirus 2 by RT PCR: NEGATIVE

## 2021-01-16 LAB — COMPREHENSIVE METABOLIC PANEL
ALT: 13 U/L (ref 0–44)
AST: 16 U/L (ref 15–41)
Albumin: 2.5 g/dL — ABNORMAL LOW (ref 3.5–5.0)
Alkaline Phosphatase: 56 U/L (ref 38–126)
Anion gap: 5 (ref 5–15)
BUN: 14 mg/dL (ref 8–23)
CO2: 32 mmol/L (ref 22–32)
Calcium: 8.1 mg/dL — ABNORMAL LOW (ref 8.9–10.3)
Chloride: 103 mmol/L (ref 98–111)
Creatinine, Ser: 0.76 mg/dL (ref 0.44–1.00)
GFR, Estimated: >60 mL/min (ref >60)
Glucose, Bld: 98 mg/dL (ref 70–99)
Potassium: 3.4 mmol/L — ABNORMAL LOW (ref 3.5–5.1)
Sodium: 140 mmol/L (ref 135–145)
Total Bilirubin: 1.1 mg/dL (ref 0.3–1.2)
Total Protein: 4.8 g/dL — ABNORMAL LOW (ref 6.5–8.1)

## 2021-01-16 LAB — MAGNESIUM: Magnesium: 2.1 mg/dL (ref 1.7–2.4)

## 2021-01-16 LAB — PHOSPHORUS: Phosphorus: 3.3 mg/dL (ref 2.5–4.6)

## 2021-01-16 LAB — SARS CORONAVIRUS 2 (TAT 6-24 HRS): SARS Coronavirus 2: NEGATIVE

## 2021-01-16 MED ORDER — AMIODARONE HCL 200 MG PO TABS
200.0000 mg | ORAL_TABLET | Freq: Two times a day (BID) | ORAL | 0 refills | Status: AC
Start: 2021-01-16 — End: ?

## 2021-01-16 MED ORDER — ALPRAZOLAM 0.25 MG PO TABS
0.2500 mg | ORAL_TABLET | Freq: Two times a day (BID) | ORAL | 0 refills | Status: AC | PRN
Start: 2021-01-16 — End: ?

## 2021-01-16 MED ORDER — ASPIRIN 325 MG PO TBEC: 325.0000 mg | DELAYED_RELEASE_TABLET | Freq: Every day | ORAL | 0 refills | Status: AC

## 2021-01-16 MED ORDER — POTASSIUM CHLORIDE CRYS ER 20 MEQ PO TBCR
40.0000 meq | EXTENDED_RELEASE_TABLET | Freq: Once | ORAL | Status: AC
  Administered 2021-01-16: 40 meq via ORAL
  Filled 2021-01-16: qty 2

## 2021-01-16 MED ORDER — OXYCODONE HCL 5 MG PO TABS: 5.0000 mg | ORAL_TABLET | Freq: Four times a day (QID) | ORAL | 0 refills | Status: AC | PRN

## 2021-01-16 MED ORDER — ASPIRIN EC 325 MG PO TBEC
325.0000 mg | DELAYED_RELEASE_TABLET | Freq: Every day | ORAL | Status: DC
  Administered 2021-01-16: 325 mg via ORAL
  Filled 2021-01-16: qty 1

## 2021-01-16 MED ORDER — CALCIUM CARBONATE 1250 (500 CA) MG PO TABS: 1250.0000 mg | ORAL_TABLET | Freq: Every day | ORAL | 0 refills | Status: AC

## 2021-01-16 MED ORDER — ACETAMINOPHEN 325 MG PO TABS: 650.0000 mg | ORAL_TABLET | ORAL | 0 refills | Status: AC | PRN

## 2021-01-16 MED ORDER — ZOLPIDEM TARTRATE 5 MG PO TABS
5.0000 mg | ORAL_TABLET | Freq: Every evening | ORAL | 0 refills | Status: AC | PRN
Start: 2021-01-16 — End: ?

## 2021-01-16 MED ORDER — MEGESTROL ACETATE 40 MG PO TABS: 160.0000 mg | ORAL_TABLET | Freq: Two times a day (BID) | ORAL | 0 refills | Status: AC

## 2021-01-16 MED ORDER — METOPROLOL TARTRATE 25 MG PO TABS: 12.5000 mg | ORAL_TABLET | Freq: Two times a day (BID) | ORAL | 0 refills | Status: AC

## 2021-01-16 MED ORDER — DRONABINOL 2.5 MG PO CAPS: 2.5000 mg | ORAL_CAPSULE | Freq: Every day | ORAL | 0 refills | Status: AC

## 2021-01-16 NOTE — Progress Notes (Signed)
Haley Kim to be D/C'd to [facility] per MD order.  Discussed prescriptions and follow up appointments with the patient. Prescriptions given to patient, medication list explained in detail. Pt verbalized understanding.  Allergies as of 01/16/2021      Reactions   Donepezil    hallucinations   Dronabinol    Mirtazapine    hallucinations      Medication List    STOP taking these medications   mirtazapine 7.5 MG tablet Commonly known as: REMERON     TAKE these medications   acetaminophen 325 MG tablet Commonly known as: TYLENOL Take 2 tablets (650 mg total) by mouth every 4 (four) hours as needed for headache or mild pain.   ALPRAZolam 0.25 MG tablet Commonly known as: XANAX Take 1 tablet (0.25 mg total) by mouth 2 (two) times daily as needed for anxiety.   amiodarone 200 MG tablet Commonly known as: PACERONE Take 1 tablet (200 mg total) by mouth 2 (two) times daily.   aspirin 325 MG EC tablet Take 1 tablet (325 mg total) by mouth daily.   calcium carbonate 1250 (500 Ca) MG tablet Commonly known as: OS-CAL - dosed in mg of elemental calcium Take 1 tablet (1,250 mg total) by mouth daily with breakfast. What changed:   medication strength  how much to take  when to take this   Coenzyme Q10 50 MG Caps Take 120 mg by mouth daily.   donepezil 5 MG tablet Commonly known as: ARICEPT Take by mouth.   dronabinol 2.5 MG capsule Commonly known as: MARINOL Take 1 capsule (2.5 mg total) by mouth daily before lunch. What changed:   how much to take  how to take this  when to take this   escitalopram 5 MG tablet Commonly known as: LEXAPRO Take 5 mg by mouth daily.   MAGNESIUM PO Take 400 mg by mouth as directed.   megestrol 40 MG tablet Commonly known as: MEGACE Take 4 tablets (160 mg total) by mouth 2 (two) times daily.   metoprolol tartrate 25 MG tablet Commonly known as: LOPRESSOR Take 0.5 tablets (12.5 mg total) by mouth 2 (two) times daily.    multivitamin tablet Take 1 tablet by mouth daily.   oxyCODONE 5 MG immediate release tablet Commonly known as: Oxy IR/ROXICODONE Take 1 tablet (5 mg total) by mouth every 6 (six) hours as needed for moderate pain.   PROBIOTIC PO Take by mouth daily.   vitamin C 500 MG tablet Commonly known as: ASCORBIC ACID Take 1,000 mg by mouth daily.   Vitamin D3 50 MCG (2000 UT) Tabs Take by mouth as directed.   vitamin E 180 MG (400 UNITS) capsule Take 400 Units by mouth daily.   zolpidem 5 MG tablet Commonly known as: AMBIEN Take 1 tablet (5 mg total) by mouth at bedtime as needed for sleep.       Vitals:   01/16/21 1044 01/16/21 1123  BP: (!) 87/58 (!) 82/46  Pulse: (!) 51 60  Resp: 17   Temp:    SpO2: 96%     Skin clean, dry and intact without evidence of skin break down, no evidence of skin tears noted. IV catheter discontinued intact. Site without signs and symptoms of complications. Dressing and pressure applied. Pt denies pain at this time. No complaints noted.  An After Visit Summary was printed and given to the patient. Patient escorted via stretcher, and D/C home via EMS.  Fuller Mandril, RN

## 2021-01-16 NOTE — Plan of Care (Signed)
  Problem: Pain Managment: Goal: General experience of comfort will improve Outcome: Progressing   Problem: Safety: Goal: Ability to remain free from injury will improve Outcome: Progressing   Problem: Skin Integrity: Goal: Risk for impaired skin integrity will decrease Outcome: Progressing   

## 2021-01-16 NOTE — Discharge Summary (Addendum)
Physician Discharge Summary  Haley Kim AXK:[id number] DOB: 05-07-1946 DOA: 01/06/2021  PCP: Adin Hector, MD  Admit date: 01/06/2021 Discharge date: 01/16/2021  Time spent: 65minutes  Recommendations for Outpatient Follow-up:   S/p fall, left humeral neck fracture, right medial tibial plateau fracture, moderate sized lipohemarthrosis, distal left radius fracture, minimally displaced left ulnar styloid fracture: -Conservative management recommended. Per ortho "Avoid any weightbearing through the left upper extremity or shoulder". -Continue analgesics as needed for pain.  -Follow-up in orthopedic surgeon.  -Awaiting SNF placement    Atrial fibrillation with RVR -off Cardizem gtt; resolved -Cardiology consulted -Amiodarone 200 mg BID -Metoprolol 12.5 mg BID -Hold Eliquis; patient fall risk.  See acute blood loss anemia.  Discussed with husband -We will discharge on aspirin 325 mg daily   Acute blood loss anemia/RIGHT knee lipohemarthrosis: -2/8 transfuse 2 units PRBC - continue to hold eliquis  Lab Results  Component Value Date   HGB 10.0 (L) 01/16/2021   HGB 10.6 (L) 01/15/2021   HGB 9.8 (L) 01/12/2021   HGB 11.1 (L) 01/11/2021   HGB 12.0 01/10/2021  -Stable  Hypotension:  -Discontinue IV fluids to avoid fluid overload. Monitor BP off of IV  Fluids. -Stable off IV fluids.  Severe protein caloric malnutrition  -Body mass index is 13.14 kg/m. (Underweight): This profoundly complicates overall prognosis. Consult dietitian. -2/16 Megace 160 mg BID  SIRS: -In the setting of trauma. No evidence of infection at this time; does not meet sepsis criteria.  -Antibiotics have been discontinued.  Hypomagnesemia, -Resolved  Dementia:  -Aricept 5 mg qhs -Husband counseled that medication would not reverse dementia at best may slow deterioration.  Hypokalemia -Potassium goal> 4 -K-Dur 40 mEq prior to discharge  Goals of care -Patient and husband  request palliative care consult at [facility]     Discharge Diagnoses:  Principal Problem:   Fall at home Active Problems:   Atrial fibrillation with RVR ([place])   Left humeral fracture   Tibial plateau fracture, right   Acute blood loss anemia   Discharge Condition: Stable  Diet recommendation: Heart healthy  Filed Weights   01/12/21 0349 01/13/21 0100 01/14/21 0430  Weight: 34.9 kg 35.8 kg 35.8 kg    History of present illness:  Haley Kim a 75 y.o. WF PMHx COPD, dyslipidemia, dementia,   Presented to the hospital after a fall at home. Reportedly, there was no loss of consciousness. In the ED, she was tachycardic and she was found to have atrial fibrillation with RVR. Work-up revealed left humeral neck fracture, right medial tibial plateau fracture complicated by moderate-sized lipohemarthrosis, fracture of the distal left radius and minimally displaced left ulnar styloid fracture. She was treated with IV Cardizem infusion for rapid A. fib. She developed acute blood loss anemia requiring 2 units of packed red blood cells. She was also treated with IV fluids and analgesics. She was evaluated by the orthopedic surgeon who recommended conservative management. TRH admitted.  Patient continues to have poor ambulation status in the setting of multiple fractures, ultimate disposition SNF at this point awaiting acceptance and insurance approval. She continues to have A. fib with RVR rebounding, cardiology following, appreciate insight and recommendations. Appears to be somewhat difficult to control. Once heart rate is better controlled and patient is able to better participate with therapy will likely discharge to SNF in the next few days again pending clinical course and heart rate.  Hospital Course:  See above   Consultations: Cardiology Orthopedic surgery  Discharge Exam: Vitals:   01/15/21 1526 01/15/21 2125 01/16/21 0359 01/16/21 0811  BP:  113/66 129/77 (!) 104/55 104/62  Pulse: 76 78 71 79  Resp: 18     Temp: 98 F (36.7 C) 97.7 F (36.5 C) 97.6 F (36.4 C) 98.2 F (36.8 C)  TempSrc:  Oral Oral Oral  SpO2: 95% 92% 96% 94%  Weight:      Height:        General: A/O x1 (does not know where, when, why) patient does know that she is in a hospital.  No acute respiratory distress, cachectic Eyes: negative scleral hemorrhage, negative anisocoria, negative icterus ENT: Negative Runny nose, negative gingival bleeding, Neck:  Negative scars, masses, torticollis, lymphadenopathy, JVD Lungs: Clear to auscultation bilaterally without wheezes or crackles Cardiovascular: Regular rate and rhythm without murmur gallop or rub normal S1 and S2 Abdomen: negative abdominal pain, nondistended, positive soft, bowel sounds, no rebound, no ascites, no appreciable mass EXTREMITIES: left upper extremity in a sling with swelling lateral condyle, right lower extremity and soft removable cast.  No complaint of pain    Discharge Instructions  Discharge Instructions    Amb referral to [facility]   Complete by: As directed      Allergies as of 01/16/2021      Reactions   Donepezil    hallucinations   Dronabinol    Mirtazapine    hallucinations      Medication List    STOP taking these medications   mirtazapine 7.5 MG tablet Commonly known as: REMERON     TAKE these medications   acetaminophen 325 MG tablet Commonly known as: TYLENOL Take 2 tablets (650 mg total) by mouth every 4 (four) hours as needed for headache or mild pain.   ALPRAZolam 0.25 MG tablet Commonly known as: XANAX Take 1 tablet (0.25 mg total) by mouth 2 (two) times daily as needed for anxiety.   amiodarone 200 MG tablet Commonly known as: PACERONE Take 1 tablet (200 mg total) by mouth 2 (two) times daily.   aspirin 325 MG EC tablet Take 1 tablet (325 mg total) by mouth daily.   calcium carbonate 1250 (500 Ca) MG tablet Commonly known as: OS-CAL - dosed  in mg of elemental calcium Take 1 tablet (1,250 mg total) by mouth daily with breakfast. What changed:   medication strength  how much to take  when to take this   Coenzyme Q10 50 MG Caps Take 120 mg by mouth daily.   donepezil 5 MG tablet Commonly known as: ARICEPT Take by mouth.   dronabinol 2.5 MG capsule Commonly known as: MARINOL Take 1 capsule (2.5 mg total) by mouth daily before lunch. What changed:   how much to take  how to take this  when to take this   escitalopram 5 MG tablet Commonly known as: LEXAPRO Take 5 mg by mouth daily.   MAGNESIUM PO Take 400 mg by mouth as directed.   megestrol 40 MG tablet Commonly known as: MEGACE Take 4 tablets (160 mg total) by mouth 2 (two) times daily.   metoprolol tartrate 25 MG tablet Commonly known as: LOPRESSOR Take 0.5 tablets (12.5 mg total) by mouth 2 (two) times daily.   multivitamin tablet Take 1 tablet by mouth daily.   oxyCODONE 5 MG immediate release tablet Commonly known as: Oxy IR/ROXICODONE Take 1 tablet (5 mg total) by mouth every 6 (six) hours as needed for moderate pain.   PROBIOTIC PO Take by mouth daily.  vitamin C 500 MG tablet Commonly known as: ASCORBIC ACID Take 1,000 mg by mouth daily.   Vitamin D3 50 MCG (2000 UT) Tabs Take by mouth as directed.   vitamin E 180 MG (400 UNITS) capsule Take 400 Units by mouth daily.   zolpidem 5 MG tablet Commonly known as: AMBIEN Take 1 tablet (5 mg total) by mouth at bedtime as needed for sleep.      Allergies  Allergen Reactions  . Donepezil     hallucinations  . Dronabinol   . Mirtazapine     hallucinations    Contact information for after-discharge care    Destination    [facility] .   Service: Skilled Nursing Contact information: [place] [phone]                   The results of significant  diagnostics from this hospitalization (including imaging, microbiology, ancillary and laboratory) are listed below for reference.    Significant Diagnostic Studies: DG Pelvis 1-2 Views  Result Date: 01/06/2021 CLINICAL DATA:  Unwitnessed fall at home. EXAM: PELVIS - 1-2 VIEW COMPARISON:  None. FINDINGS: Both hips are normally located. No definite acute hip fracture. The pubic symphysis and SI joints are intact. No definite pelvic fractures. IMPRESSION: No acute bony findings. Electronically Signed   By: Marijo Sanes M.D.   On: 01/06/2021 17:21   DG Wrist 2 Views Left  Result Date: 01/07/2021 CLINICAL DATA:  Left wrist pain after fall yesterday EXAM: LEFT WRIST - 2 VIEW COMPARISON:  None. FINDINGS: Frontal and lateral views of the left wrist are obtained. There is an impacted fracture of the distal left radius with dorsal angulation. There appears to be intra-articular extension on the frontal view. Small displaced ulnar styloid fracture also noted. The radiocarpal joint remains intact. Incidental lunotriquetral coalition, and anatomic variant. There is diffuse soft tissue edema of the left wrist and distal forearm. IMPRESSION: 1. Impacted intra-articular fracture of the distal left radius, with dorsal angulation at the radiocarpal joint. 2. Minimally displaced ulnar styloid fracture. 3. Diffuse soft tissue edema. Electronically Signed   By: Randa Ngo M.D.   On: 01/07/2021 15:02   CT Head Wo Contrast  Result Date: 01/06/2021 CLINICAL DATA:  Patient found down on floor by has bone, unknown mechanism. Patient endorses neck and back pain. EXAM: CT HEAD WITHOUT CONTRAST CT CERVICAL SPINE WITHOUT CONTRAST TECHNIQUE: Multidetector CT imaging of the head and cervical spine was performed following the standard protocol without intravenous contrast. Multiplanar CT image reconstructions of the cervical spine were also generated. COMPARISON:  None. FINDINGS: CT HEAD FINDINGS Study is slightly motion degraded.  Brain: No evidence of acute infarction, hemorrhage, hydrocephalus, extra-axial collection or mass lesion/mass effect. Global parenchymal volume loss with ex vacuo dilatation of the ventricular system. Mild volume of chronic ischemic white matter disease. 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 Vascular: No hyperdense vessel or unexpected calcification. Skull: Normal. Negative for fracture or focal lesion. Sinuses/Orbits: Paranasal sinuses are clear.  Prior lens surgery. Other: None CT CERVICAL SPINE FINDINGS Study is slightly motion degraded. Alignment: Normal. Skull base and vertebrae: No acute fracture. No primary bone lesion or focal pathologic process. Soft tissues and spinal canal: No prevertebral fluid or swelling. No visible canal hematoma. Disc levels: Multilevel degenerative changes spine with disc space narrowing, disc osteophyte complex ease, uncovertebral and facet hypertrophy, most significant in the lower cervical  spine. Upper chest: Biapical scarring. Other: None IMPRESSION: Study is slightly motion degraded.  Within this context: 1.  There is no acute intracranial abnormality 2.  No visualized fracture or subluxation of the cervical spine. Electronically Signed   By: Dahlia Bailiff MD   On: 01/06/2021 16:27   CT Cervical Spine Wo Contrast  Result Date: 01/06/2021 CLINICAL DATA:  Patient found down on floor by has bone, unknown mechanism. Patient endorses neck and back pain. EXAM: CT HEAD WITHOUT CONTRAST CT CERVICAL SPINE WITHOUT CONTRAST TECHNIQUE: Multidetector CT imaging of the head and cervical spine was performed following the standard protocol without intravenous contrast. Multiplanar CT image reconstructions of the cervical spine were also generated. COMPARISON:  None. FINDINGS: CT HEAD FINDINGS Study is slightly motion degraded. Brain: No evidence of acute infarction, hemorrhage, hydrocephalus, extra-axial collection or mass lesion/mass effect. Global parenchymal volume loss with  ex vacuo dilatation of the ventricular system. Mild volume of chronic ischemic white matter disease. 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 Vascular: No hyperdense vessel or unexpected calcification. Skull: Normal. Negative for fracture or focal lesion. Sinuses/Orbits: Paranasal sinuses are clear.  Prior lens surgery. Other: None CT CERVICAL SPINE FINDINGS Study is slightly motion degraded. Alignment: Normal. Skull base and vertebrae: No acute fracture. No primary bone lesion or focal pathologic process. Soft tissues and spinal canal: No prevertebral fluid or swelling. No visible canal hematoma. Disc levels: Multilevel degenerative changes spine with disc space narrowing, disc osteophyte complex ease, uncovertebral and facet hypertrophy, most significant in the lower cervical spine. Upper chest: Biapical scarring. Other: None IMPRESSION: Study is slightly motion degraded.  Within this context: 1.  There is no acute intracranial abnormality 2.  No visualized fracture or subluxation of the cervical spine. Electronically Signed   By: Dahlia Bailiff MD   On: 01/06/2021 16:27   CT Knee Right Wo Contrast  Result Date: 01/06/2021 CLINICAL DATA:  [place].  Abnormal x-rays. EXAM: CT OF THE right KNEE WITHOUT CONTRAST TECHNIQUE: Multidetector CT imaging of the right knee was performed according to the standard protocol. Multiplanar CT image reconstructions were also generated. COMPARISON:  Right knee radiographs, same date. FINDINGS: Nondisplaced but slightly impacted medial tibial plateau fracture. Small fracture lines along the intra-articular portion accounting for the lipohemarthrosis. The tibial spines and lateral tibial plateau are intact. No femur, fibula or patella fractures. Advanced changes of osteoporosis. Moderate-sized lipohemarthrosis is noted. Grossly by CT the cruciate and collateral ligaments are intact. The quadriceps and patellar tendons are intact. IMPRESSION: 1. Nondisplaced but slightly impacted  medial tibial plateau fracture. 2. Moderate-sized lipohemarthrosis. 3. Advanced changes of osteoporosis. 4. No other fractures are identified. Electronically Signed   By: Marijo Sanes M.D.   On: 01/06/2021 19:06   DG Shoulder Left  Result Date: 01/06/2021 CLINICAL DATA:  Found down at home.  Left shoulder pain. EXAM: LEFT SHOULDER - 2+ VIEW COMPARISON:  None. FINDINGS: Nondisplaced fracture noted through the humeral neck and probably involving the base of the greater tuberosity. The [facility] joint is intact. No clavicle fracture. The visualized left ribs are intact and the visualized left lung is grossly clear. IMPRESSION: Nondisplaced left humeral neck fracture. Electronically Signed   By: Marijo Sanes M.D.   On: 01/06/2021 17:17   DG Knee Complete 4 Views Right  Result Date: 01/06/2021 CLINICAL DATA:  Found down at home.  Right knee pain. EXAM: RIGHT KNEE - COMPLETE 4+ VIEW COMPARISON:  None FINDINGS: The joint spaces are maintained. No acute fracture is  identified. However, there is a lipohemarthrosis noted on the lateral film, highly suggestive of fracture. Recommend CT for further evaluation. IMPRESSION: No definite acute fracture. However, suspect lipohemarthrosis highly suggestive of fracture. CT is recommended for further evaluation. Electronically Signed   By: Marijo Sanes M.D.   On: 01/06/2021 17:20   ECHOCARDIOGRAM COMPLETE  Result Date: 01/08/2021    ECHOCARDIOGRAM REPORT   Patient Name:   Haley Kim Date of Exam: 01/07/2021 Medical Rec #:  [id number]          Height:       66.3 in Accession #:    [id number]         Weight:       70.0 lb Date of Birth:  Dec 13, 1945          BSA:          1.284 m Patient Age:    23 years           BP:           121/64 mmHg Patient Gender: F                  HR:           105 bpm. Exam Location:  [facility] Procedure: 2D Echo, Cardiac Doppler and Color Doppler Indications:    Atrial Fibrillation I48.91  History:        Patient has no prior history of Echocardiogram  examinations.                 COPD.  Sonographer:    Sherrie Sport RDCS (AE) Referring Phys: [id number] Mary Sella A MANSY Diagnosing      Yolonda Kida MD Phys:  Sonographer Comments: Technically difficult study due to poor echo windows, no apical window and suboptimal parasternal window. Image acquisition challenging due to patient body habitus and Image acquisition challenging due to COPD. IMPRESSIONS  1. Left ventricular ejection fraction, by estimation, is 50 to 55%. The left ventricle has low normal function. The left ventricle has no regional wall motion abnormalities. Left ventricular diastolic parameters were normal.  2. Right ventricular systolic function is normal. The right ventricular size is normal.  3. The mitral valve is normal in structure. No evidence of mitral valve regurgitation.  4. The aortic valve is normal in structure. Aortic valve regurgitation is not visualized. Conclusion(s)/Recommendation(s): Poor windows for evaluation of left ventricular function by transthoracic echocardiography. Would recommend an alternative means of evaluation. FINDINGS  Left Ventricle: Left ventricular ejection fraction, by estimation, is 50 to 55%. The left ventricle has low normal function. The left ventricle has no regional wall motion abnormalities. The left ventricular internal cavity size was normal in size. There is no left ventricular hypertrophy. Left ventricular diastolic parameters were normal. Right Ventricle: The right ventricular size is normal. No increase in right ventricular wall thickness. Right ventricular systolic function is normal. Left Atrium: Left atrial size was normal in size. Right Atrium: Right atrial size was normal in size. Pericardium: There is no evidence of pericardial effusion. Mitral Valve: The mitral valve is normal in structure. No evidence of mitral valve regurgitation. Tricuspid Valve: The tricuspid valve is not well visualized. Tricuspid valve regurgitation is trivial. Aortic Valve:  The aortic valve is normal in structure. Aortic valve regurgitation is not visualized. Pulmonic Valve: The pulmonic valve was normal in structure. Pulmonic valve regurgitation is not visualized. Aorta: The aortic arch was not well visualized. IAS/Shunts: No atrial level shunt detected by color flow  Doppler.  LEFT VENTRICLE PLAX 2D LVIDd:         3.23 cm LVIDs:         2.59 cm LV PW:         0.83 cm LV IVS:        0.61 cm LVOT diam:     2.00 cm LVOT Area:     3.14 cm  LEFT ATRIUM         Index LA diam:    2.40 cm 1.87 cm/m                        PULMONIC VALVE AORTA                 PV Vmax:        0.77 m/s Ao Root diam: 2.95 cm PV Peak grad:   2.4 mmHg                       RVOT Peak grad: 2 mmHg   SHUNTS Systemic Diam: 2.00 cm Yolonda Kida MD Electronically signed by Yolonda Kida MD Signature Date/Time: 01/08/2021/8:09:16 AM    Final     Microbiology: Recent Results (from the past 240 hour(s))  SARS Coronavirus 2 by RT PCR (hospital order, performed in [place] hospital lab) Nasopharyngeal Nasopharyngeal Swab     Status: None   Collection Time: 01/06/21  8:41 PM   Specimen: Nasopharyngeal Swab  Result Value Ref Range Status   SARS Coronavirus 2 NEGATIVE NEGATIVE Final    Comment: (NOTE) SARS-CoV-2 target nucleic acids are NOT DETECTED.  The SARS-CoV-2 RNA is generally detectable in upper and lower respiratory specimens during the acute phase of infection. The lowest concentration of SARS-CoV-2 viral copies this assay can detect is 250 copies / mL. A negative result does not preclude SARS-CoV-2 infection and should not be used as the sole basis for treatment or other patient management decisions.  A negative result may occur with improper specimen collection / handling, submission of specimen other than nasopharyngeal swab, presence of viral mutation(s) within the areas targeted by this assay, and inadequate number of viral copies (<250 copies / mL). A negative result must be  combined with clinical observations, patient history, and epidemiological information.  Fact Sheet for Patients:   StrictlyIdeas.no  Fact Sheet for Healthcare Providers: BankingDealers.co.za  This test is not yet approved or  cleared by the [place] FDA and has been authorized for detection and/or diagnosis of SARS-CoV-2 by FDA under an Emergency Use Authorization (EUA).  This EUA will remain in effect (meaning this test can be used) for the duration of the COVID-19 declaration under Section 564(b)(1) of the Act, 21 U.S.C. section 360bbb-3(b)(1), unless the authorization is terminated or revoked sooner.  Performed at [facility], [address] [place], [place] [address]   Culture, blood (x 2)     Status: None   Collection Time: 01/06/21  8:41 PM   Specimen: BLOOD  Result Value Ref Range Status   Specimen Description BLOOD LEFT ANTECUBITAL  Final   Special Requests   Final    BOTTLES DRAWN AEROBIC AND ANAEROBIC Blood Culture adequate volume   Culture   Final    NO GROWTH 5 DAYS Performed at [facility], [address], [place],  [address]    Report Status 01/12/2021 FINAL  Final  Culture, blood (x 2)     Status: None   Collection  Time: 01/06/21  8:41 PM   Specimen: BLOOD  Result Value Ref Range Status   Specimen Description BLOOD RIGHT ANTECUBITAL  Final   Special Requests   Final    BOTTLES DRAWN AEROBIC AND ANAEROBIC Blood Culture adequate volume   Culture   Final    NO GROWTH 5 DAYS Performed at [facility], [address] [place], [place] [address]    Report Status 01/12/2021 FINAL  Final     Labs: Basic Metabolic Panel: Recent Labs  Lab 01/10/21 0422 01/11/21 0415 01/12/21 0433 01/15/21 0549 01/16/21 0521  NA 141 138 138 141 140  K 4.5 5.0 3.8 3.9 3.4*  CL 107 103 104 102 103  CO2 26 29 27 30  32  GLUCOSE 125* 128* 100* 97 98  BUN 20 19 16 16 14   CREATININE  0.83 0.80 0.79 0.70 0.76  CALCIUM 8.7* 8.5* 8.0* 8.3* 8.1*  MG  --   --   --  2.3 2.1  PHOS  --   --   --   --  3.3   Liver Function Tests: Recent Labs  Lab 01/10/21 0422 01/11/21 0415 01/12/21 0433 01/16/21 0521  AST 34 28 22 16   ALT 22 21 17 13   ALKPHOS 30* 31* 29* 56  BILITOT 2.0* 1.6* 1.5* 1.1  PROT 5.6* 5.2* 4.7* 4.8*  ALBUMIN 3.1* 2.9* 2.6* 2.5*   No results for input(s): LIPASE, AMYLASE in the last 168 hours. No results for input(s): AMMONIA in the last 168 hours. CBC: Recent Labs  Lab 01/10/21 0422 01/11/21 0415 01/12/21 0433 01/15/21 0549 01/16/21 0521  WBC 11.0* 9.4 8.8 7.1 4.8  HGB 12.0 11.1* 9.8* 10.6* 10.0*  HCT 34.7* 33.1* 29.8* 32.8* 30.8*  MCV 95.1 96.2 97.1 99.7 100.0  PLT 122* 129* 149* 260 258   Cardiac Enzymes: No results for input(s): CKTOTAL, CKMB, CKMBINDEX, TROPONINI in the last 168 hours. BNP: BNP (last 3 results) No results for input(s): BNP in the last 8760 hours.  ProBNP (last 3 results) No results for input(s): PROBNP in the last 8760 hours.  CBG: No results for input(s): GLUCAP in the last 168 hours.     Signed:  Dia Crawford, MD [facility]

## 2021-01-16 NOTE — Progress Notes (Signed)
Occupational Therapy Treatment Patient Details Name: Haley Kim MRN: [id number] DOB: 01-Jun-1946 Today's Date: 01/16/2021    History of present illness 75 y.o. female COPD, dyslipidemia, dementia, who presented to the hospital after a fall at home.  Reportedly, there was no loss of consciousness.  In the ED, she was tachycardic and she was found to have atrial fibrillation with RVR.  Work-up revealed left humeral neck fracture, right medial tibial plateau fracture complicated by moderate-sized lipohemarthrosis, fracture of the distal left radius and minimally displaced left ulnar styloid fracture.   OT comments  Haley Kim presents today with pain, dementia, generalized weakness, decreased ROM, and with weightbearing restrictions. She is exceedingly pleasant and agreeable to therapy, while endorsing pain in LUE and RLE, exacerbated by movement, and displaying guarding behaviors with LUE. Pt is oriented to self only. Pt also displays increased HR with bed-level movement. With set-up and encouragement, Haley Kim is able to assist with grooming and UB dressing tasks. While hospitalized, pt can continue to benefit from OT services to address pain, ROM, and functional mobility as possible. Her rehab potential, however, is limited, and husband expresses interest in exploring comfort care options. Recommend referral for SNF-based hospice services.   Follow Up Recommendations  SNF    Equipment Recommendations       Recommendations for Other Services Other (comment) (hospice/palliative care consult)    Precautions / Restrictions Precautions Precautions: Fall Required Braces or Orthoses: Knee Immobilizer - Right;Sling;Other Brace Knee Immobilizer - Right: On at all times Other Brace: wrist brace  and sling on L UE NWB; KI on RLE Restrictions Weight Bearing Restrictions: Yes LUE Weight Bearing: Non weight bearing RLE Weight Bearing: Non weight bearing       Mobility Bed  Mobility Overal bed mobility: Needs Assistance Bed Mobility: Supine to Sit;Sit to Supine     Supine to sit: Mod assist Sit to supine: Mod assist      Transfers                 General transfer comment: Pt declined standing    Balance Overall balance assessment: Needs assistance Sitting-balance support: Feet supported Sitting balance-Leahy Scale: Good Sitting balance - Comments: supervision for sitting                                   ADL either performed or assessed with clinical judgement   ADL Overall ADL's : Needs assistance/impaired     Grooming: Wash/dry hands;Wash/dry face;Set up;Minimal assistance Grooming Details (indicate cue type and reason): Pt able to wash face with Min A, using R UE only         Upper Body Dressing : Maximal assistance Upper Body Dressing Details (indicate cue type and reason): Pt able to provide assistance threading R UE into gown                         Vision Patient Visual Report: No change from baseline     Perception     Praxis      Cognition Arousal/Alertness: Awake/alert Behavior During Therapy: WFL for tasks assessed/performed Overall Cognitive Status: History of cognitive impairments - at baseline                                 General Comments: Pleasant very pleasant and conversational, but not oriented  to place, time, situation.        Exercises Total Joint Exercises Ankle Circles/Pumps: AROM;Both;10 reps;Supine Gluteal Sets: AROM;10 reps;Supine Hip ABduction/ADduction: AROM;5 reps;Both Other Exercises Other Exercises: bedlevel grooming and UB dressing, therex/AROM, supine<>sit, educ re D/C   Shoulder Instructions       General Comments elevated HR (115+) with supine<>sit    Pertinent Vitals/ Pain       Pain Assessment: Faces Faces Pain Scale: Hurts even more Pain Location: L shoulder painful at rest, with pain shooting up (pt grimacing and crying out) with  even minimal movement of L UE Pain Descriptors / Indicators: Grimacing;Guarding;Crying Pain Intervention(s): Limited activity within patient's tolerance;Monitored during session  Home Living                                          Prior Functioning/Environment              Frequency           Progress Toward Goals  OT Goals(current goals can now be found in the care plan section)  Progress towards OT goals: Progressing toward goals  Acute Rehab OT Goals Patient Stated Goal: to be comfortable OT Goal Formulation: With family Time For Goal Achievement: 01/22/21 Potential to Achieve Goals: Good  Plan Discharge plan needs to be updated;Frequency remains appropriate    Co-evaluation                 AM-PAC OT "6 Clicks" Daily Activity     Outcome Measure   Help from another person eating meals?: A Little Help from another person taking care of personal grooming?: A Little Help from another person toileting, which includes using toliet, bedpan, or urinal?: Total Help from another person bathing (including washing, rinsing, drying)?: A Lot Help from another person to put on and taking off regular upper body clothing?: A Lot Help from another person to put on and taking off regular lower body clothing?: A Lot 6 Click Score: 13    End of Session    OT Visit Diagnosis: Unsteadiness on feet (R26.81);Repeated falls (R29.6);Muscle weakness (generalized) (M62.81);Pain Pain - Right/Left: Left Pain - part of body: Leg;Shoulder   Activity Tolerance Patient limited by pain;Treatment limited secondary to medical complications (Comment) (NWB, RLE and LUE)   Patient Left in bed;with call bell/phone within reach;with bed alarm set;with family/visitor present;with nursing/sitter in room   Nurse Communication          Time: 1030-1055 OT Time Calculation (min): 25 min  Charges: OT General Charges $OT Visit: 1 Visit OT Treatments $Self Care/Home  Management : 23-37 mins  Josiah Lobo, PhD, [place], OTR/L ascom [phone] 01/16/21, 11:27 AM

## 2021-01-16 NOTE — Progress Notes (Signed)
Rapid COVID sent.   Fuller Mandril, RN

## 2021-01-16 NOTE — Care Management Important Message (Signed)
Important Message  Patient Details  Name: Haley Kim MRN: [id number] Date of Birth: 12-Jul-1946   Medicare Important Message Given:  Yes     Dannette Barbara 01/16/2021, 1:36 PM

## 2021-01-16 NOTE — TOC Transition Note (Signed)
Transition of Care [facility]) - CM/SW Discharge Note   Patient Details  Name: Haley Kim MRN: [id number] Date of Birth: September 24, 1946  Transition of Care [facility]) CM/SW Contact:  Beverly Sessions, RN Phone Number: 01/16/2021, 10:11 AM   Clinical Narrative:    Patient to discharge to [place] today Husband at bedside and updated  EMS packet on chart Repeat covid test negative DC info sent in the [facility]  EMS transport called Bedside RN to call report    Final next level of care: Skilled Nursing Facility Barriers to Discharge: No Barriers Identified   Patient Goals and CMS Choice        Discharge Placement              Patient chooses bed at: [facility] Patient to be transferred to facility by: EMS Name of family member notified: Husband Patient and family notified of of transfer: 01/16/21  Discharge Plan and Services                                     Social Determinants of Health (SDOH) Interventions     Readmission Risk Interventions No flowsheet data found.

## 2021-03-30 DEATH — deceased

## 2022-05-21 ENCOUNTER — Telehealth: Payer: Self-pay | Admitting: *Deleted

## 2022-05-21 NOTE — Telephone Encounter (Signed)
Called pt to schedule an OV per the recall assessment sheet per Dr Ardis Hughs prior to a colon- LM to return call to schedule an OV   Marie PV
